# Patient Record
Sex: Female | Born: 1940 | Race: White | Hispanic: No | Marital: Married | Smoking: Never smoker
Health system: Southern US, Community
[De-identification: ages and names within clinical notes are randomized; demographics above are authoritative.]

## PROBLEM LIST (undated history)

## (undated) DIAGNOSIS — M1990 Unspecified osteoarthritis, unspecified site: Secondary | ICD-10-CM

## (undated) DIAGNOSIS — M419 Scoliosis, unspecified: Secondary | ICD-10-CM

## (undated) DIAGNOSIS — E669 Obesity, unspecified: Secondary | ICD-10-CM

## (undated) DIAGNOSIS — Z8719 Personal history of other diseases of the digestive system: Secondary | ICD-10-CM

## (undated) DIAGNOSIS — K219 Gastro-esophageal reflux disease without esophagitis: Secondary | ICD-10-CM

## (undated) DIAGNOSIS — I209 Angina pectoris, unspecified: Secondary | ICD-10-CM

## (undated) DIAGNOSIS — T8859XA Other complications of anesthesia, initial encounter: Secondary | ICD-10-CM

## (undated) DIAGNOSIS — G4733 Obstructive sleep apnea (adult) (pediatric): Secondary | ICD-10-CM

## (undated) DIAGNOSIS — Q760 Spina bifida occulta: Secondary | ICD-10-CM

## (undated) DIAGNOSIS — G8929 Other chronic pain: Secondary | ICD-10-CM

## (undated) DIAGNOSIS — I4891 Unspecified atrial fibrillation: Secondary | ICD-10-CM

## (undated) DIAGNOSIS — K5790 Diverticulosis of intestine, part unspecified, without perforation or abscess without bleeding: Secondary | ICD-10-CM

## (undated) DIAGNOSIS — M545 Low back pain, unspecified: Secondary | ICD-10-CM

## (undated) DIAGNOSIS — F329 Major depressive disorder, single episode, unspecified: Secondary | ICD-10-CM

## (undated) DIAGNOSIS — F419 Anxiety disorder, unspecified: Secondary | ICD-10-CM

## (undated) DIAGNOSIS — F32A Depression, unspecified: Secondary | ICD-10-CM

## (undated) DIAGNOSIS — E785 Hyperlipidemia, unspecified: Secondary | ICD-10-CM

## (undated) DIAGNOSIS — T4145XA Adverse effect of unspecified anesthetic, initial encounter: Secondary | ICD-10-CM

## (undated) DIAGNOSIS — I739 Peripheral vascular disease, unspecified: Secondary | ICD-10-CM

## (undated) DIAGNOSIS — G629 Polyneuropathy, unspecified: Secondary | ICD-10-CM

## (undated) DIAGNOSIS — I341 Nonrheumatic mitral (valve) prolapse: Secondary | ICD-10-CM

## (undated) DIAGNOSIS — I8390 Asymptomatic varicose veins of unspecified lower extremity: Secondary | ICD-10-CM

## (undated) DIAGNOSIS — I639 Cerebral infarction, unspecified: Secondary | ICD-10-CM

## (undated) DIAGNOSIS — IMO0002 Reserved for concepts with insufficient information to code with codable children: Secondary | ICD-10-CM

## (undated) DIAGNOSIS — K589 Irritable bowel syndrome without diarrhea: Secondary | ICD-10-CM

## (undated) DIAGNOSIS — D649 Anemia, unspecified: Secondary | ICD-10-CM

## (undated) DIAGNOSIS — I10 Essential (primary) hypertension: Secondary | ICD-10-CM

## (undated) DIAGNOSIS — N189 Chronic kidney disease, unspecified: Secondary | ICD-10-CM

## (undated) DIAGNOSIS — M797 Fibromyalgia: Secondary | ICD-10-CM

## (undated) DIAGNOSIS — I679 Cerebrovascular disease, unspecified: Secondary | ICD-10-CM

## (undated) DIAGNOSIS — L719 Rosacea, unspecified: Secondary | ICD-10-CM

## (undated) HISTORY — DX: Low back pain: M54.5

## (undated) HISTORY — DX: Major depressive disorder, single episode, unspecified: F32.9

## (undated) HISTORY — PX: MINOR HEMORRHOIDECTOMY: SHX6238

## (undated) HISTORY — DX: Unspecified osteoarthritis, unspecified site: M19.90

## (undated) HISTORY — PX: VAGINAL HYSTERECTOMY: SUR661

## (undated) HISTORY — PX: CARDIAC CATHETERIZATION: SHX172

## (undated) HISTORY — DX: Hyperlipidemia, unspecified: E78.5

## (undated) HISTORY — PX: TONSILLECTOMY: SUR1361

## (undated) HISTORY — PX: OTHER SURGICAL HISTORY: SHX169

## (undated) HISTORY — DX: Polyneuropathy, unspecified: G62.9

## (undated) HISTORY — DX: Nonrheumatic mitral (valve) prolapse: I34.1

## (undated) HISTORY — DX: Essential (primary) hypertension: I10

## (undated) HISTORY — DX: Anxiety disorder, unspecified: F41.9

## (undated) HISTORY — DX: Depression, unspecified: F32.A

## (undated) HISTORY — DX: Anemia, unspecified: D64.9

## (undated) HISTORY — DX: Chronic kidney disease, unspecified: N18.9

## (undated) HISTORY — PX: ABDOMINAL HYSTERECTOMY: SHX81

## (undated) HISTORY — DX: Peripheral vascular disease, unspecified: I73.9

## (undated) HISTORY — DX: Obesity, unspecified: E66.9

## (undated) HISTORY — PX: CHOLECYSTECTOMY: SHX55

## (undated) HISTORY — DX: Asymptomatic varicose veins of unspecified lower extremity: I83.90

## (undated) HISTORY — DX: Rosacea, unspecified: L71.9

## (undated) HISTORY — DX: Cerebral infarction, unspecified: I63.9

## (undated) HISTORY — PX: COLONOSCOPY: SHX174

## (undated) HISTORY — DX: Cerebrovascular disease, unspecified: I67.9

## (undated) HISTORY — DX: Obstructive sleep apnea (adult) (pediatric): G47.33

## (undated) HISTORY — DX: Other chronic pain: G89.29

## (undated) HISTORY — DX: Diverticulosis of intestine, part unspecified, without perforation or abscess without bleeding: K57.90

## (undated) HISTORY — DX: Low back pain, unspecified: M54.50

## (undated) HISTORY — DX: Reserved for concepts with insufficient information to code with codable children: IMO0002

## (undated) HISTORY — DX: Irritable bowel syndrome, unspecified: K58.9

## (undated) HISTORY — PX: ESOPHAGOGASTRODUODENOSCOPY: SHX1529

## (undated) HISTORY — PX: POLYPECTOMY: SHX149

## (undated) HISTORY — DX: Unspecified atrial fibrillation: I48.91

## (undated) HISTORY — DX: Spina bifida occulta: Q76.0

## (undated) HISTORY — DX: Gastro-esophageal reflux disease without esophagitis: K21.9

## (undated) HISTORY — DX: Fibromyalgia: M79.7

---

## 2007-10-22 HISTORY — PX: ENDOVENOUS ABLATION SAPHENOUS VEIN W/ LASER: SUR449

## 2013-07-03 ENCOUNTER — Telehealth: Payer: Self-pay | Admitting: Neurology

## 2013-07-03 MED ORDER — GABAPENTIN 600 MG PO TABS: 600.0000 mg | ORAL_TABLET | Freq: Three times a day (TID) | ORAL | Status: DC

## 2013-07-03 NOTE — Telephone Encounter (Signed)
Patient has an appt scheduled in May.  [employer] was bought out by [employer].  Rx has been sent. I called the patient back, got no answer.  Left message.

## 2013-07-26 ENCOUNTER — Telehealth: Payer: Self-pay | Admitting: Neurology

## 2013-07-26 MED ORDER — CLOPIDOGREL BISULFATE 75 MG PO TABS
37.5000 mg | ORAL_TABLET | Freq: Every day | ORAL | Status: DC
Start: 2013-07-26 — End: 2013-12-30

## 2013-07-26 NOTE — Telephone Encounter (Signed)
Last OV note says: The patient will continue the gabapentin for now. The patient is on aspirin and Plavix, but she is only taking one half of a Plavix daily. The patient will followup in one year. Rx has been sent.  I called the patient back.  Got no answer.  Left message.

## 2013-07-26 NOTE — Telephone Encounter (Signed)
Patient requesting refill of Plavix.  The original prescription was for  90 tablets (she only takes 1/2 tablet now and is questioning should the quantity be changed to 45 tablets? Please call to advise.

## 2013-07-26 NOTE — Telephone Encounter (Signed)
Patient requesting refill. Please advise.

## 2013-07-30 ENCOUNTER — Telehealth: Payer: Self-pay | Admitting: Neurology

## 2013-07-30 NOTE — Telephone Encounter (Signed)
Last OV note says: The patient is on aspirin and Plavix, but she is only taking one half of a Plavix daily I called back.  Spoke with Clair Gulling.  Verified Rx.  They will contact patient for shipment.  Nothing further is needed at this time.

## 2013-07-30 NOTE — Telephone Encounter (Signed)
The pharmacist from [facility] left a message that the patient's current prescription for Plavix is one half tab daily.  Previously she was taking 1 tab daily, he wanted clarification.  The reference # is [id number].

## 2013-07-30 NOTE — Telephone Encounter (Signed)
The patient apparently is taking one half Plavix tablet daily. The patient is also on aspirin. She has been taking one half of a Plavix for least a year and a half.

## 2013-10-14 ENCOUNTER — Other Ambulatory Visit: Payer: Self-pay | Admitting: *Deleted

## 2013-10-14 ENCOUNTER — Encounter: Payer: Self-pay | Admitting: Neurology

## 2013-10-14 ENCOUNTER — Encounter (INDEPENDENT_AMBULATORY_CARE_PROVIDER_SITE_OTHER): Payer: Self-pay

## 2013-10-14 ENCOUNTER — Ambulatory Visit (INDEPENDENT_AMBULATORY_CARE_PROVIDER_SITE_OTHER): Payer: BC Managed Care – PPO | Admitting: Neurology

## 2013-10-14 VITALS — BP 110/61 | HR 73 | Wt 217.0 lb

## 2013-10-14 DIAGNOSIS — G459 Transient cerebral ischemic attack, unspecified: Secondary | ICD-10-CM | POA: Insufficient documentation

## 2013-10-14 DIAGNOSIS — I679 Cerebrovascular disease, unspecified: Secondary | ICD-10-CM

## 2013-10-14 DIAGNOSIS — I83893 Varicose veins of bilateral lower extremities with other complications: Secondary | ICD-10-CM

## 2013-10-14 DIAGNOSIS — G63 Polyneuropathy in diseases classified elsewhere: Secondary | ICD-10-CM | POA: Insufficient documentation

## 2013-10-14 NOTE — Patient Instructions (Signed)

## 2013-10-14 NOTE — Progress Notes (Signed)
PATIENT: Samantha Woodard DOB: [birth year]  REASON FOR VISIT: follow up HISTORY FROM: patient  HISTORY OF PRESENT ILLNESS: Ms. Brigandi is a 73 year old female with a history of peripheral neuropathy and cerebrovascular disease. She returns today for follow-up. The patient continues gabapentin and is tolerating it well. She states that is does relieve some of the discomfort related to the peripheral neuropathy. The patient has chronic low back pain, she has been to physical therapy several times when the pain became severe. She denies any falls. When she was working in her flower bed she was bent over for too long and toppled over. She takes Plavix for cerebrovascular disease and tolerates it well. Since the last visit she was started on an oxygen concentrator at night. She reports that has been very beneficial.   REVIEW OF SYSTEMS: Full 14 system review of systems performed and notable only for:  Constitutional: N/A  Eyes: N/A Ear/Nose/Throat: N/A  Skin: N/A  Cardiovascular: leg swelling   Respiratory: N/A  Gastrointestinal: incontinence of bowels Genitourinary: urgency Hematology/Lymphatic: bruise/bleed easily Endocrine: N/A Musculoskeletal: joint pain, joint swelling, back pain, aching muscles  Allergy/Immunology: Env allergies  Neurological: numbness Psychiatric: depression  Sleep: apnea and snoring   ALLERGIES: Allergies  Allergen Reactions  . Iodine   . Neomycin   . Tape     HOME MEDICATIONS: Outpatient Prescriptions Prior to Visit  Medication Sig Dispense Refill  . clopidogrel (PLAVIX) 75 MG tablet Take 0.5 tablets (37.5 mg total) by mouth daily.  45 tablet  1  . gabapentin (NEURONTIN) 600 MG tablet Take 1 tablet (600 mg total) by mouth 3 (three) times daily.  270 tablet  1   No facility-administered medications prior to visit.    PAST MEDICAL HISTORY: Past Medical History  Diagnosis Date  . Obesity   . Peripheral neuropathy   . Cerebrovascular disease   .  Arthritis, degenerative   . Diverticulosis   . Mild mitral valve prolapse   . GERD (gastroesophageal reflux disease)   . HTN (hypertension)   . Anxiety and depression   . Fibromyalgia   . IBS (irritable bowel syndrome)   . Positional vertigo   . Dyslipidemia   . OSA (obstructive sleep apnea)   . Varicose vein   . Spina bifida occulta   . Chronic low back pain     PAST SURGICAL HISTORY: Past Surgical History  Procedure Laterality Date  . Abdominal hysterectomy    . Gallbladder resection    . Tonsillectomy    . Minor hemorrhoidectomy      FAMILY HISTORY: Family History  Problem Relation Age of Onset  . Cancer Mother   . Heart attack Father   . Diabetes Sister   . Hypertension Sister   . Hypertension Brother   . Cerebrovascular Disease Maternal Grandmother   . Stroke Paternal Grandmother   . Hypertension Sister     SOCIAL HISTORY: History   Social History  . Marital Status: Married    Spouse Name: N/A    Number of Children: 2  . Years of Education: college 2   Occupational History  . Retired    Social History Main Topics  . Smoking status: Never Smoker   . Smokeless tobacco: Never Used  . Alcohol Use: Yes     Comment: on occasion  . Drug Use: No  . Sexual Activity: Not on file   Other Topics Concern  . Not on file   Social History Narrative  . No narrative  on file      PHYSICAL EXAM  Filed Vitals:   10/14/13 1106  BP: 110/61  Pulse: 73  Weight: 217 lb (98.431 kg)   There is no height on file to calculate BMI.  Generalized: Well developed, in no acute distress   Neurological examination  Mentation: Alert oriented to time, place, history taking. Follows all commands speech and language fluent Cranial nerve II-XII:  Extraocular movements were full, visual field were full on confrontational test. Motor: The motor testing reveals 5 over 5 strength of all 4 extremities. Good symmetric motor tone is noted throughout.  Sensory: Sensory testing  is intact to soft touch on all 4 extremities. No evidence of extinction is noted.  Coordination: Cerebellar testing reveals good finger-nose-finger and heel-to-shin bilaterally.  Gait and station: Gait is normal. Tandem gait is minimally unsteady. Romberg is negative. No drift is seen.  Reflexes: Deep tendon reflexes are symmetric and normal bilaterally.    DIAGNOSTIC DATA (LABS, IMAGING, TESTING) - I reviewed patient records, labs, notes, testing and imaging myself where available.  Patient had recent labs drawn at her PCP office. Results: Chol 198, Trig 136, LDL 123, HDL 150 NA 137, K 5.1, CL 101, CA 9.8, ALP 76, AST 23, ALT 18, BUN 25, CR 1.00, GFR 54  ASSESSMENT AND PLAN 73 y.o. year old female  has a past medical history of Obesity; Peripheral neuropathy; Cerebrovascular disease; Arthritis, degenerative; Diverticulosis; Mild mitral valve prolapse; GERD (gastroesophageal reflux disease); HTN (hypertension); Anxiety and depression; Fibromyalgia; IBS (irritable bowel syndrome); Positional vertigo; Dyslipidemia; OSA (obstructive sleep apnea); Varicose vein; Spina bifida occulta; and Chronic low back pain. here with:  1. Cerebrovascular disease, unspecified 2. Polyneuropathy in other diseases classified elsewhere  The patient has remained stable. Gabapentin has been well tolerated and offers good relief of neuropathy pain. The patient is on Plavix for cerebrovascular disease Continue Gabapentin and Plavix Follow-up in 1 year or sooner if needed.    Ward Givens, MSN, NP-C 10/14/2013, 11:18 AM [facility] [address], [place], [place] [address] [phone]  Note: This document was prepared with digital dictation and possible smart phrase technology. Any transcriptional errors that result from this process are unintentional. Kathrynn Ducking

## 2013-11-26 DIAGNOSIS — R159 Full incontinence of feces: Secondary | ICD-10-CM | POA: Insufficient documentation

## 2013-11-26 DIAGNOSIS — K648 Other hemorrhoids: Secondary | ICD-10-CM | POA: Insufficient documentation

## 2013-12-04 ENCOUNTER — Encounter: Payer: Self-pay | Admitting: Vascular Surgery

## 2013-12-05 ENCOUNTER — Encounter: Payer: Self-pay | Admitting: Vascular Surgery

## 2013-12-05 ENCOUNTER — Ambulatory Visit (HOSPITAL_COMMUNITY)
Admission: RE | Admit: 2013-12-05 | Discharge: 2013-12-05 | Disposition: A | Discharge status: Home / Self Care | Payer: MEDICARE | Source: Ambulatory Visit | Attending: Vascular Surgery | Admitting: Vascular Surgery

## 2013-12-05 ENCOUNTER — Ambulatory Visit (INDEPENDENT_AMBULATORY_CARE_PROVIDER_SITE_OTHER): Payer: BC Managed Care – PPO | Admitting: Vascular Surgery

## 2013-12-05 VITALS — BP 137/63 | HR 69 | Ht 69.0 in | Wt 218.0 lb

## 2013-12-05 DIAGNOSIS — I83893 Varicose veins of bilateral lower extremities with other complications: Secondary | ICD-10-CM | POA: Insufficient documentation

## 2013-12-05 DIAGNOSIS — Z7902 Long term (current) use of antithrombotics/antiplatelets: Secondary | ICD-10-CM | POA: Insufficient documentation

## 2013-12-05 NOTE — Progress Notes (Signed)
Referred by:  Gara Kroner, MD [address] [place], [place] [address]  Reason for referral: Swollen legs bilaterally  History of Present Illness  Samantha Woodard is a 73 y.o. (08/13/1940) female who presents with chief complaint: swollen legs.  Patient notes swelling of legs for several years. Symptoms are described as heaviness and aching. She is sedentary mostly. She has had a previous history of varicose veins. She had laser ablation of the left greater saphenous vein several years ago. The patient denies a history of DVT,  history of venous stasis ulcers, lymphedema and history of skin changes in lower legs.  There is a positive family history of venous disorders.  The patient has used compression stockings in the past. She also complains of a burning sensation in the inner aspect of her left calf over several varicosities.  She has hypertension treated with an ace inhibitor. She has hypercholesterolemia treated with a statin. She is on aspirin. She is not diabetic.   Past Medical History  Diagnosis Date  . Obesity   . Peripheral neuropathy   . Cerebrovascular disease   . Arthritis, degenerative   . Diverticulosis   . Mild mitral valve prolapse   . GERD (gastroesophageal reflux disease)   . HTN (hypertension)   . Anxiety and depression   . Fibromyalgia   . IBS (irritable bowel syndrome)   . Positional vertigo   . Dyslipidemia   . OSA (obstructive sleep apnea)   . Varicose vein   . Spina bifida occulta   . Chronic low back pain   . Anemia   . Atrial fibrillation   . Stroke   . Chronic kidney disease   . Peripheral vascular disease     Past Surgical History  Procedure Laterality Date  . Abdominal hysterectomy    . Gallbladder resection    . Tonsillectomy    . Minor hemorrhoidectomy    . Endovenous ablation saphenous vein w/ laser  10/22/2007    History   Social History  . Marital Status: Married    Spouse Name: N/A    Number of Children: 2  .  Years of Education: college 2   Occupational History  . Retired    Social History Main Topics  . Smoking status: Never Smoker   . Smokeless tobacco: Never Used  . Alcohol Use: Yes     Comment: on occasion  . Drug Use: No  . Sexual Activity: Not on file   Other Topics Concern  . Not on file   Social History Narrative  . No narrative on file    Family History  Problem Relation Age of Onset  . Cancer Mother   . Hypertension Mother   . Varicose Veins Mother   . Heart attack Mother   . Heart attack Father   . Cancer Father   . Heart disease Father   . Hypertension Father   . Diabetes Sister   . Hypertension Sister   . Hyperlipidemia Sister   . Varicose Veins Sister   . Hypertension Brother   . Hyperlipidemia Brother   . Cerebrovascular Disease Maternal Grandmother   . Stroke Paternal Grandmother   . Hypertension Sister   . Hyperlipidemia Sister   . Varicose Veins Sister       Current Outpatient Prescriptions on File Prior to Visit  Medication Sig Dispense Refill  . celecoxib (CELEBREX) 200 MG capsule Take 200 mg by mouth daily.      . clopidogrel (  PLAVIX) 75 MG tablet Take 0.5 tablets (37.5 mg total) by mouth daily.  45 tablet  1  . dicyclomine (BENTYL) 10 MG capsule Take 10 mg by mouth daily.      . fluticasone (FLONASE) 50 MCG/ACT nasal spray Place 2 sprays into both nostrils daily.      Marland Kitchen gabapentin (NEURONTIN) 600 MG tablet Take 1 tablet (600 mg total) by mouth 3 (three) times daily.  270 tablet  1  . hydrochlorothiazide (HYDRODIURIL) 12.5 MG tablet Take 12.5 mg by mouth daily.      Marland Kitchen lisinopril (PRINIVIL,ZESTRIL) 5 MG tablet Take 2.5 mg by mouth daily.      . pantoprazole (PROTONIX) 40 MG tablet Take 40 mg by mouth daily.      . sertraline (ZOLOFT) 100 MG tablet Take 100 mg by mouth daily.      . simvastatin (ZOCOR) 20 MG tablet Take 20 mg by mouth daily.       No current facility-administered medications on file prior to visit.    Allergies  Allergen  Reactions  . Iodine   . Neomycin   . Tape       REVIEW OF SYSTEMS:  (Positives checked otherwise negative)  CARDIOVASCULAR:  []  chest pain, []  chest pressure, []  palpitations, []  shortness of breath when laying flat, []  shortness of breath with exertion,  [x]  pain in legs when walking, []  pain in feet when laying flat, []  history of blood clot in veins (DVT), []  history of phlebitis, [x]  swelling in legs, [x]  varicose veins  PULMONARY:  []  productive cough, []  asthma, []  wheezing  NEUROLOGIC:  []  weakness in arms or legs, []  numbness in arms or legs, []  difficulty speaking or slurred speech, []  temporary loss of vision in one eye, []  dizziness  HEMATOLOGIC:  []  bleeding problems, []  problems with blood clotting too easily  MUSCULOSKEL:  []  joint pain, []  joint swelling  GASTROINTEST:  []  vomiting blood, []  blood in stool     GENITOURINARY:  []  burning with urination, []  blood in urine  PSYCHIATRIC:  []  history of major depression  INTEGUMENTARY:  []  rashes, []  ulcers  CONSTITUTIONAL:  []  fever, []  chills   Physical Examination Filed Vitals:   12/05/13 1322  BP: 137/63  Pulse: 69  Height: 5\' 9"  (1.753 m)  Weight: 218 lb (98.884 kg)  SpO2: 96%   Body mass index is 32.18 kg/(m^2).  General: A&O x 3, WD obese female in NAD  Head: [place]/AT  Ear/Nose/Throat: Hearing grossly intact  Eyes: PERRLA, EOMI  Neck: Supple, no nuchal rigidity, no palpable LAD  Pulmonary: Sym exp, good air movt, CTAB, no rales, rhonchi, & wheezing  Cardiac: RRR, Nl S1, S2, no Murmurs, rubs or gallops  Vascular: Vessel Right Left  Radial Palpable Palpable  Carotid Without bruit Without bruit  Aorta Not palpable N/A  Femoral 2+ 2+  Popliteal Not palpable Not palpable  PT 1+ 1+  DP Not palpable Not palpable   Extremities: Diffuse reticular veins and spider telangectasias of lower extremities. No tortuous varicose veins seen.  Gastrointestinal: soft, NTND, -G/R, - HSM, -  masses  Musculoskeletal: M/S 5/5 throughout. Extremities without ischemic changes   Neurologic: CN 2-12 intact.  Motor exam as listed above  Psychiatric: Judgment intact, Mood & affect appropriate for pt's clinical situation  Dermatologic: See M/S exam for extremity exam, no rashes otherwise noted  Lymph : No inguinal lymphadenopathy   Non-Invasive Vascular Imaging  BLE Venous Insufficiency Duplex (Date: 12/05/2013):   RLE:  negative DVT and SVT, negative GSV reflux, positive deep venous reflux  LLE: negative DVT and SVT, positive GSV reflux, positive deep venous reflux  Outside Studies/Documentation Reviewed notes from previous laser ablation performed at [facility].   Medical Decision Making  Kasmira Cacioppo is a 73 y.o. female who presents with: bilateral LE chronic venous insufficiency    Based on the patient's history and examination, recommended continuation of 20-30 mm compression stockings and sclerotherapy. She has had previous left saphenous laser ablation.   Patient has decided to proceed with sclerotherapy and an appointment has been set up with the clinic vein nurse.   Discussed weight loss and elevation of legs.   Thank you for allowing [place] to participate in this patient's care.  Virgina Jock, PA-C [facility] Office: [phone] Pager: [phone]  12/05/2013, 2:04 PM  History exam details as above. Her venous duplex scan shows that she has had successful obliteration of her left greater saphenous vein. She has multiple spider reticular type veins in her left and right lower extremity which are bothersome to her. She has been overall fairly compliant with wearing compression stockings. I encouraged her to continue this. We did discuss weight loss would improve 75% of venous symptoms. She is going to try to lose some weight over time. She will replace her compression stockings on as-needed basis. I have referred her  to our vein nurse Kathlee Nations Wort for sclerotherapy of the areas that are symptomatic in both lower extremities.  Ruta Hinds, MD [facility] Office: [phone] Pager: [phone]

## 2013-12-18 ENCOUNTER — Telehealth: Payer: Self-pay | Admitting: Neurology

## 2013-12-18 MED ORDER — GABAPENTIN 600 MG PO TABS: 600.0000 mg | ORAL_TABLET | Freq: Three times a day (TID) | ORAL | Status: DC

## 2013-12-18 NOTE — Telephone Encounter (Signed)
Patient requesting Rx refill for gabapentin (NEURONTIN) 600 MG tablet.  Please call and advise.  Thanks

## 2013-12-18 NOTE — Telephone Encounter (Signed)
Rx has been sent.  I called the patient back, got no answer.  Left message.

## 2013-12-30 ENCOUNTER — Telehealth: Payer: Self-pay | Admitting: Neurology

## 2013-12-30 MED ORDER — CLOPIDOGREL BISULFATE 75 MG PO TABS: 37.5000 mg | ORAL_TABLET | Freq: Every day | ORAL | Status: DC

## 2013-12-30 MED ORDER — GABAPENTIN 600 MG PO TABS: 600.0000 mg | ORAL_TABLET | Freq: Three times a day (TID) | ORAL | Status: DC

## 2013-12-30 NOTE — Telephone Encounter (Signed)
Last OV from [facility] says: The patient will continue the gabapentin for now. The patient is on aspirin and Plavix, but she is only taking one half of a Plavix daily. The patient will followup in one year.  [employer] was purchased by [employer].  Rx's have been sent.

## 2013-12-30 NOTE — Telephone Encounter (Signed)
Needs 90day supply clopidogrel (PLAVIX) 75 MG tablet as well as the gabapentin (NEURONTIN) 600 MG tablet should have been sent to [facility]. they both need to be sent to [facility].

## 2014-03-27 DIAGNOSIS — Z86010 Personal history of colonic polyps: Secondary | ICD-10-CM | POA: Insufficient documentation

## 2014-04-23 ENCOUNTER — Encounter: Payer: Self-pay | Admitting: Neurology

## 2014-04-29 ENCOUNTER — Encounter: Payer: Self-pay | Admitting: Neurology

## 2014-10-16 ENCOUNTER — Ambulatory Visit (INDEPENDENT_AMBULATORY_CARE_PROVIDER_SITE_OTHER): Payer: Medicare Other | Admitting: Neurology

## 2014-10-16 ENCOUNTER — Encounter: Payer: Self-pay | Admitting: Neurology

## 2014-10-16 VITALS — BP 109/67 | HR 75 | Ht 69.0 in | Wt 222.8 lb

## 2014-10-16 DIAGNOSIS — G63 Polyneuropathy in diseases classified elsewhere: Secondary | ICD-10-CM | POA: Diagnosis not present

## 2014-10-16 DIAGNOSIS — I679 Cerebrovascular disease, unspecified: Secondary | ICD-10-CM | POA: Diagnosis not present

## 2014-10-16 MED ORDER — CLOPIDOGREL BISULFATE 75 MG PO TABS: 37.5000 mg | ORAL_TABLET | Freq: Every day | ORAL | Status: DC

## 2014-10-16 MED ORDER — GABAPENTIN 600 MG PO TABS: 600.0000 mg | ORAL_TABLET | Freq: Three times a day (TID) | ORAL | Status: DC

## 2014-10-16 NOTE — Patient Instructions (Signed)

## 2014-10-16 NOTE — Progress Notes (Signed)
Reason for visit: Peripheral neuropathy  Samantha Woodard is an 74 y.o. female  History of present illness:  Samantha Woodard is a 74 year old white female with a history of a peripheral neuropathy. The patient has done well over the last year. The patient is on gabapentin, and this seems to be controlling her discomfort. The patient does have some mild balance issues, but she denies any falls since last seen. When she is walking long distances, she may use a cane for ambulation. She does have some difficulty with sleeping at times. The patient will get up and walk in the evenings on occasion to help the discomfort. She does have some urgency of the bladder, and occasional incontinence of bowel and bladder. 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 She has had a lot of arthritis in the knees, and she is getting injections in the knees at times. She returns to this office for an evaluation.  Past Medical History  Diagnosis Date  . Obesity   . Peripheral neuropathy   . Cerebrovascular disease   . Arthritis, degenerative   . Diverticulosis   . Mild mitral valve prolapse   . GERD (gastroesophageal reflux disease)   . HTN (hypertension)   . Anxiety and depression   . Fibromyalgia   . IBS (irritable bowel syndrome)   . Positional vertigo   . Dyslipidemia   . OSA (obstructive sleep apnea)   . Varicose vein   . Spina bifida occulta   . Chronic low back pain   . Anemia   . Atrial fibrillation   . Stroke   . Chronic kidney disease   . Peripheral vascular disease     Past Surgical History  Procedure Laterality Date  . Abdominal hysterectomy    . Gallbladder resection    . Tonsillectomy    . Minor hemorrhoidectomy    . Endovenous ablation saphenous vein w/ laser  10/22/2007    Family History  Problem Relation Age of Onset  . Cancer Mother   . Hypertension Mother   . Varicose Veins Mother   . Heart attack Mother   . Heart attack Father   . Cancer Father     . Heart disease Father   . Hypertension Father   . Diabetes Sister   . Hypertension Sister   . Hyperlipidemia Sister   . Varicose Veins Sister   . Hypertension Brother   . Hyperlipidemia Brother   . Cerebrovascular Disease Maternal Grandmother   . Stroke Paternal Grandmother   . Hypertension Sister   . Hyperlipidemia Sister   . Varicose Veins Sister     Social history:  reports that she has never smoked. She has never used smokeless tobacco. She reports that she does not drink alcohol or use illicit drugs.    Allergies  Allergen Reactions  . Iodine   . Neomycin   . Tape     Medications:  Prior to Admission medications   Medication Sig Start Date End Date Taking? Authorizing Provider  aspirin 81 MG tablet Take 81 mg by mouth daily.   Yes Historical Provider, MD  carisoprodol (SOMA) 350 MG tablet Take 350 mg by mouth as needed for muscle spasms (Patient usually only take 1/2 tablet).    Yes Historical Provider, MD  clopidogrel (PLAVIX) 75 MG tablet Take 0.5 tablets (37.5 mg total) by mouth daily. 12/30/13  Yes Kathrynn Ducking, MD  fluticasone [facility]) 50 MCG/ACT nasal spray Place 2 sprays into both nostrils daily.  08/16/13  Yes Historical Provider, MD  gabapentin (NEURONTIN) 600 MG tablet Take 1 tablet (600 mg total) by mouth 3 (three) times daily. 12/30/13  Yes Kathrynn Ducking, MD  hydrochlorothiazide (HYDRODIURIL) 12.5 MG tablet Take 6.25 mg by mouth daily.  07/18/13  Yes Historical Provider, MD  lisinopril (PRINIVIL,ZESTRIL) 5 MG tablet Take 2.5 mg by mouth daily. 07/17/13  Yes Historical Provider, MD  pantoprazole (PROTONIX) 40 MG tablet Take 40 mg by mouth daily. 07/26/13  Yes Historical Provider, MD  sertraline (ZOLOFT) 100 MG tablet Take 100 mg by mouth daily. 07/26/13  Yes Historical Provider, MD  simvastatin (ZOCOR) 20 MG tablet Take 20 mg by mouth daily. 09/03/13  Yes Historical Provider, MD    ROS:  Out of a complete 14 system review of symptoms, the patient complains  only of the following symptoms, and all other reviewed systems are negative.  Activity change, decreased activity Leg swelling Incontinence of bowel Sleep apnea Environmental allergies Urinary urgency Bruising easily Seasonal depression  Blood pressure 109/67, pulse 75, height 5\' 9"  (1.753 m), weight 222 lb 12.8 oz (101.061 kg).  Physical Exam  General: The patient is alert and cooperative at the time of the examination. The patient is moderately obese.  Skin: No significant peripheral edema is noted.   Neurologic Exam  Mental status: The patient is alert and oriented x 3 at the time of the examination. The patient has apparent normal recent and remote memory, with an apparently normal attention span and concentration ability.   Cranial nerves: Facial symmetry is present. Speech is normal, no aphasia or dysarthria is noted. Extraocular movements are full. Visual fields are full.  Motor: The patient has good strength in all 4 extremities.  Sensory examination: Soft touch sensation is symmetric on the face, arms, and legs. There is a stocking glove pinprick sensory deficit up to the knees bilaterally.  Coordination: The patient has good finger-nose-finger and heel-to-shin bilaterally.  Gait and station: The patient has a normal gait. Tandem gait is slightly unsteady. Romberg is negative. No drift is seen.  Reflexes: Deep tendon reflexes are symmetric, but are depressed.   Assessment/Plan:  1. Peripheral neuropathy  2. Mild gait disturbance  3. History of cerebrovascular disease  The patient is doing fairly well at this time. She will continue her gabapentin at the current dose. She has a history of cerebrovascular disease, and she remains on low-dose Plavix and aspirin. Prescriptions were called in for these medications. She will follow-up in one year, or sooner if needed.  Jill Alexanders MD 10/16/2014 7:55 PM  [facility] [address] [place], [place] [address]  Phone [phone] Fax [phone]

## 2015-02-18 ENCOUNTER — Encounter: Payer: Self-pay | Admitting: *Deleted

## 2015-03-02 ENCOUNTER — Ambulatory Visit: Payer: Medicare Other | Admitting: General Surgery

## 2015-04-15 ENCOUNTER — Encounter: Payer: Self-pay | Admitting: *Deleted

## 2015-10-13 ENCOUNTER — Ambulatory Visit (INDEPENDENT_AMBULATORY_CARE_PROVIDER_SITE_OTHER): Payer: Medicare Other | Admitting: Adult Health

## 2015-10-13 ENCOUNTER — Encounter: Payer: Self-pay | Admitting: Adult Health

## 2015-10-13 VITALS — BP 143/75 | HR 76 | Ht 69.0 in | Wt 239.6 lb

## 2015-10-13 DIAGNOSIS — I679 Cerebrovascular disease, unspecified: Secondary | ICD-10-CM | POA: Diagnosis not present

## 2015-10-13 DIAGNOSIS — G629 Polyneuropathy, unspecified: Secondary | ICD-10-CM

## 2015-10-13 MED ORDER — GABAPENTIN 600 MG PO TABS: 600.0000 mg | ORAL_TABLET | Freq: Three times a day (TID) | ORAL | Status: DC

## 2015-10-13 MED ORDER — CLOPIDOGREL BISULFATE 75 MG PO TABS: 37.5000 mg | ORAL_TABLET | Freq: Every day | ORAL | Status: DC

## 2015-10-13 NOTE — Progress Notes (Signed)
PATIENT: Samantha Woodard DOB: 09-24-1940  REASON FOR VISIT: follow up- peripheral neuropathy, cerebrovascular disease HISTORY FROM: patient  HISTORY OF PRESENT ILLNESS: Samantha Woodard is a 75 year old female with a history of peripheral neuropathy and cerebrovascular disease. She returns today for follow-up. She continues to take gabapentin 600 mg 3 times a day. She states that this controls her symptoms well. Occasionally she will have tingling and numbness in the left foot that is intermittent however she is unsure if this is due to the neuropathy or her spine issues. She does report some tingling in the fingers out laterally. She feels that this may be related to neuropathy however she is worried that it could be related to her spine. She is currently in physical therapy for scoliosis. She reports no significant changes in her gait or balance. She states occasionally she'll have issues with her joints which will affect her balance. She reports that she will often veer off to the left. She remains on Plavix low-dose after TIA events. She reports that she is tolerating this medication well. She returns today for an evaluation.  HISTORY 10/16/14 (WILLIS): Samantha Woodard is a 75 year old white female with a history of a peripheral neuropathy. The patient has done well over the last year. The patient is on gabapentin, and this seems to be controlling her discomfort. The patient does have some mild balance issues, but she denies any falls since last seen. When she is walking long distances, she may use a cane for ambulation. She does have some difficulty with sleeping at times. The patient will get up and walk in the evenings on occasion to help the discomfort. She does have some urgency of the bladder, and occasional incontinence of bowel and bladder. 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 She has had a lot of arthritis in the knees, and she is getting injections in the knees at times.  She returns to this office for an evaluation.   REVIEW OF SYSTEMS: Out of a complete 14 system review of symptoms, the patient complains only of the following symptoms, and all other reviewed systems are negative.  Joint pain, joint swelling, back pain, aching muscles, muscle cramps, walking difficulty, urgency, environmental allergies, bruise/bleed easily, numbness, apnea, snoring, leg swelling, ringing in ears  ALLERGIES: Allergies  Allergen Reactions  . Iodine   . Neomycin   . Tape     HOME MEDICATIONS: Outpatient Prescriptions Prior to Visit  Medication Sig Dispense Refill  . aspirin 81 MG tablet Take 81 mg by mouth daily.    . carisoprodol (SOMA) 350 MG tablet Take 350 mg by mouth as needed for muscle spasms (Patient usually only take 1/2 tablet).     . clopidogrel (PLAVIX) 75 MG tablet Take 0.5 tablets (37.5 mg total) by mouth daily. 45 tablet 3  . fluticasone (FLONASE) 50 MCG/ACT nasal spray Place 2 sprays into both nostrils daily.    Marland Kitchen gabapentin (NEURONTIN) 600 MG tablet Take 1 tablet (600 mg total) by mouth 3 (three) times daily. 270 tablet 3  . hydrochlorothiazide (HYDRODIURIL) 12.5 MG tablet Take 6.25 mg by mouth daily.     Marland Kitchen lisinopril (PRINIVIL,ZESTRIL) 5 MG tablet Take 2.5 mg by mouth daily.    . pantoprazole (PROTONIX) 40 MG tablet Take 40 mg by mouth daily.    . simvastatin (ZOCOR) 20 MG tablet Take 20 mg by mouth daily.    . sertraline (ZOLOFT) 100 MG tablet Take 100 mg by mouth daily. Reported on  10/13/2015     No facility-administered medications prior to visit.    PAST MEDICAL HISTORY: Past Medical History  Diagnosis Date  . Obesity   . Peripheral neuropathy ([place])   . Cerebrovascular disease   . Arthritis, degenerative   . Diverticulosis   . Mild mitral valve prolapse   . GERD (gastroesophageal reflux disease)   . HTN (hypertension)   . Anxiety and depression   . Fibromyalgia   . IBS (irritable bowel syndrome)   . Positional vertigo   . Dyslipidemia     . OSA (obstructive sleep apnea)   . Varicose vein   . Spina bifida occulta   . Chronic low back pain   . Anemia   . Atrial fibrillation ([place])   . Stroke ([place])   . Chronic kidney disease   . Peripheral vascular disease ([place])     PAST SURGICAL HISTORY: Past Surgical History  Procedure Laterality Date  . Abdominal hysterectomy    . Gallbladder resection    . Tonsillectomy    . Minor hemorrhoidectomy    . Endovenous ablation saphenous vein w/ laser  10/22/2007    FAMILY HISTORY: Family History  Problem Relation Age of Onset  . Cancer Mother   . Hypertension Mother   . Varicose Veins Mother   . Heart attack Mother   . Heart attack Father   . Cancer Father   . Heart disease Father   . Hypertension Father   . Diabetes Sister   . Hypertension Sister   . Hyperlipidemia Sister   . Varicose Veins Sister   . Hypertension Brother   . Hyperlipidemia Brother   . Cerebrovascular Disease Maternal Grandmother   . Stroke Paternal Grandmother   . Hypertension Sister   . Hyperlipidemia Sister   . Varicose Veins Sister   . Carpal tunnel syndrome Sister     SOCIAL HISTORY: Social History   Social History  . Marital Status: Married    Spouse Name: N/A  . Number of Children: 2  . Years of Education: college 2   Occupational History  . Retired    Social History Main Topics  . Smoking status: Never Smoker   . Smokeless tobacco: Never Used  . Alcohol Use: No     Comment: on occasion  . Drug Use: No  . Sexual Activity: Not on file   Other Topics Concern  . Not on file   Social History Narrative   Patient is right handed.   Patient does not drink caffeine.      PHYSICAL EXAM  Filed Vitals:   10/13/15 1415  BP: 143/75  Pulse: 76  Height: 5\' 9"  (1.753 m)  Weight: 239 lb 9.6 oz (108.682 kg)   Body mass index is 35.37 kg/(m^2).  Generalized: Well developed, in no acute distress   Neurological examination  Mentation: Alert oriented to time, place, history  taking. Follows all commands speech and language fluent Cranial nerve II-XII: Pupils were equal round reactive to light. Extraocular movements were full, visual field were full on confrontational test. Facial sensation and strength were normal. Uvula tongue midline. Head turning and shoulder shrug  were normal and symmetric. Motor: The motor testing reveals 5 over 5 strength of all 4 extremities. Good symmetric motor tone is noted throughout.  Sensory: Sensory testing is intact to soft touch on all 4 extremities. No evidence of extinction is noted.  Coordination: Cerebellar testing reveals good finger-nose-finger and heel-to-shin bilaterally.  Gait and station: Gait is normal. Tandem gait  is normal. Romberg is negative. No drift is seen.  Reflexes: Deep tendon reflexes are symmetric and normal bilaterally.   DIAGNOSTIC DATA (LABS, IMAGING, TESTING) - I reviewed patient records, labs, notes, testing and imaging myself where available.  MRI of the lumbar spine 09/10/2015:  Moderate lumbar levoscoliosis Advanced multilevel disc and facet degeneration with mild lateral recess and neural foraminal narrowing as above    ASSESSMENT AND PLAN 75 y.o. year old female  has a past medical history of Obesity; Peripheral neuropathy ([place]); Cerebrovascular disease; Arthritis, degenerative; Diverticulosis; Mild mitral valve prolapse; GERD (gastroesophageal reflux disease); HTN (hypertension); Anxiety and depression; Fibromyalgia; IBS (irritable bowel syndrome); Positional vertigo; Dyslipidemia; OSA (obstructive sleep apnea); Varicose vein; Spina bifida occulta; Chronic low back pain; Anemia; Atrial fibrillation ([place]); Stroke [facility]); Chronic kidney disease; and Peripheral vascular disease ([place]). here with:  1. History of vascular disease 2. Peripheral neuropathy  Overall the patient is doing well. She will continue on gabapentin 600 mg 3 times a day. She will continue to monitor her symptoms. If the discomfort  in her hands worsened she will let us know. We may have to consider a nerve conduction with EMG of the upper extremities. She will continue on Plavix. Refills was sent for both gabapentin and Plavix today. She will follow-up in one year or sooner if needed.     Ward Givens, MSN, NP-C 10/13/2015, 2:38 PM [facility] [address], [place], [place] [address] [phone]

## 2015-10-13 NOTE — Progress Notes (Signed)
I have read the note, and I agree with the clinical assessment and plan.  Dyna Figuereo KEITH   

## 2015-10-13 NOTE — Patient Instructions (Signed)
Continue gabapentin 600 mg three times a day Continue Plavix If your symptoms worsen or you develop new symptoms please let us know.

## 2015-10-14 ENCOUNTER — Ambulatory Visit: Payer: Medicare Other | Admitting: Adult Health

## 2015-10-16 ENCOUNTER — Ambulatory Visit: Payer: Medicare Other | Admitting: Neurology

## 2015-11-04 ENCOUNTER — Telehealth: Payer: Self-pay | Admitting: Adult Health

## 2015-11-04 NOTE — Telephone Encounter (Signed)
LMVM for pt that got message for refill on plavix.   [facility].  If needs to go to other pharmacy to call me back.

## 2015-11-04 NOTE — Telephone Encounter (Signed)
Message For: OFFICE               Taken 29-MAY-17 at 12:01PM by DEF ------------------------------------------------------------  Samantha Woodard [facility]              CID  [id number]   Patient  SAME                  Pt's Dr  [name]        Area Code  336  Phone#  [phone]  [phone]      RE  NEEDS PLAVIX REFILL                                                                                     Disp:Y/N  N  If Y = C/B If No Response In 4minutes  ============================================================

## 2015-11-04 NOTE — Telephone Encounter (Signed)
Message For: [facility]                  Taken 31-MAY-17 at 11:16AM by JPO ------------------------------------------------------------  Samantha Woodard [facility]              CID  [id number]   Patient  SAME                  Pt's Dr  Clabe Seal      Area Code  336  Phone#  [phone]  2 24 [place]                                                              Disp:Y/N  N  If Y = C/B If No Response In 76minutes  ============================================================

## 2015-11-04 NOTE — Telephone Encounter (Signed)
Did confirm with pt.  She will go [facility] and check.  She will call back as needed.

## 2015-11-30 ENCOUNTER — Telehealth: Payer: Self-pay | Admitting: *Deleted

## 2015-11-30 NOTE — Telephone Encounter (Signed)
Received notice as FYI, that pt receiving less then recommended dose of plavix. Taking 1/2 tablet daily.  Per last office note, aware.  Forwarded notice to MM/NP.

## 2015-12-09 ENCOUNTER — Telehealth: Payer: Self-pay | Admitting: *Deleted

## 2015-12-09 NOTE — Telephone Encounter (Signed)
Received fax relating to gabapentin and kidney function. Placed in inbox.

## 2015-12-09 NOTE — Telephone Encounter (Signed)
Please obtain her latest lab results (kidney function) from PCP.

## 2015-12-10 NOTE — Telephone Encounter (Signed)
I called and [facility] and spoke to MR and they will fax 11/2015 lab results to [place] at [phone].

## 2015-12-18 NOTE — Telephone Encounter (Signed)
Called again.  Will resend to fax [phone]. (initially sent 12-10-15).  Received placed on desk inbox.

## 2015-12-22 NOTE — Telephone Encounter (Signed)
Lab work relatively unremarkable. Her GFR slightly decreased- 50, HDL increased 135

## 2016-03-02 ENCOUNTER — Other Ambulatory Visit: Payer: Self-pay | Admitting: Obstetrics and Gynecology

## 2016-03-02 DIAGNOSIS — Z1231 Encounter for screening mammogram for malignant neoplasm of breast: Secondary | ICD-10-CM

## 2016-03-16 ENCOUNTER — Encounter: Payer: Self-pay | Admitting: Podiatry

## 2016-03-16 ENCOUNTER — Other Ambulatory Visit: Payer: Self-pay | Admitting: *Deleted

## 2016-03-16 ENCOUNTER — Encounter: Payer: Self-pay | Admitting: *Deleted

## 2016-03-16 ENCOUNTER — Ambulatory Visit (INDEPENDENT_AMBULATORY_CARE_PROVIDER_SITE_OTHER): Payer: Medicare Other | Admitting: Podiatry

## 2016-03-16 DIAGNOSIS — M79673 Pain in unspecified foot: Secondary | ICD-10-CM

## 2016-03-16 DIAGNOSIS — M79676 Pain in unspecified toe(s): Secondary | ICD-10-CM | POA: Diagnosis not present

## 2016-03-16 DIAGNOSIS — B351 Tinea unguium: Secondary | ICD-10-CM

## 2016-03-16 NOTE — Progress Notes (Signed)
   Subjective:    Patient ID: Samantha Woodard, female    DOB: 1940-08-09, 75 y.o.   MRN: [id number]  HPI: She presents today to complaint of painful elongated toenails that she had a portion of the hallux nails removed many years ago but small fragments are growing back.    Review of Systems  Constitutional: Positive for activity change and unexpected weight change.  HENT: Positive for sinus pressure.   Gastrointestinal: Positive for abdominal distention and diarrhea.  Genitourinary: Positive for urgency.  Musculoskeletal: Positive for arthralgias, back pain, gait problem and myalgias.  Neurological: Positive for weakness and numbness.  Hematological: Bruises/bleeds easily.  All other systems reviewed and are negative.      Objective:   Physical Exam: Vital signs are stable she is alert and oriented 3 pulses are palpable. Neurologic sensorium is intact. Mild hammertoe deformities are noted. Toenails are thick yellow dystrophic onychomycotic.          Assessment & Plan:  A limp secondary onychomycosis.  Plan: Debridement toenails 1 through 5 bilateral.

## 2016-03-25 ENCOUNTER — Ambulatory Visit
Admission: RE | Admit: 2016-03-25 | Discharge: 2016-03-25 | Disposition: A | Discharge status: Home / Self Care | Payer: Medicare Other | Source: Ambulatory Visit | Attending: Obstetrics and Gynecology | Admitting: Obstetrics and Gynecology

## 2016-03-25 ENCOUNTER — Encounter: Payer: Self-pay | Admitting: Radiology

## 2016-03-25 DIAGNOSIS — Z1231 Encounter for screening mammogram for malignant neoplasm of breast: Secondary | ICD-10-CM | POA: Diagnosis not present

## 2016-03-30 ENCOUNTER — Other Ambulatory Visit: Payer: Self-pay | Admitting: *Deleted

## 2016-03-30 ENCOUNTER — Ambulatory Visit
Admission: RE | Admit: 2016-03-30 | Discharge: 2016-03-30 | Disposition: A | Discharge status: Home / Self Care | Payer: Self-pay | Source: Ambulatory Visit | Attending: *Deleted | Admitting: *Deleted

## 2016-03-30 DIAGNOSIS — Z9289 Personal history of other medical treatment: Secondary | ICD-10-CM

## 2016-06-22 ENCOUNTER — Ambulatory Visit: Payer: Medicare Other | Admitting: Podiatry

## 2016-06-27 ENCOUNTER — Encounter: Payer: Self-pay | Admitting: Podiatry

## 2016-06-27 ENCOUNTER — Ambulatory Visit (INDEPENDENT_AMBULATORY_CARE_PROVIDER_SITE_OTHER): Payer: Medicare Other | Admitting: Podiatry

## 2016-06-27 DIAGNOSIS — B351 Tinea unguium: Secondary | ICD-10-CM

## 2016-06-27 DIAGNOSIS — M79676 Pain in unspecified toe(s): Secondary | ICD-10-CM | POA: Diagnosis not present

## 2016-06-27 NOTE — Progress Notes (Signed)
Complaint:  Visit Type: Patient returns to my office for continued preventative foot care services. Complaint: Patient states" my nails have grown long and thick and become painful to walk and wear shoes"  The patient presents for preventative foot care services. No changes to ROS  Podiatric Exam: Vascular: dorsalis pedis and posterior tibial pulses are palpable bilateral. Capillary return is immediate. Temperature gradient is WNL. Skin turgor WNL  Sensorium: Normal Semmes Weinstein monofilament test. Normal tactile sensation bilaterally. Nail Exam: Pt has thick disfigured discolored nails with subungual debris noted bilateral entire nail hallux through fifth toenails Ulcer Exam: There is no evidence of ulcer or pre-ulcerative changes or infection. Orthopedic Exam: Muscle tone and strength are WNL. No limitations in general ROM. No crepitus or effusions noted. Foot type and digits show no abnormalities. Forefoot equinus foot type with hammer toes 2-5  B/L Skin: No Porokeratosis. No infection or ulcers  Diagnosis:  Onychomycosis, , Pain in right toe, pain in left toes  Treatment & Plan Procedures and Treatment: Consent by patient was obtained for treatment procedures. The patient understood the discussion of treatment and procedures well. All questions were answered thoroughly reviewed. Debridement of mycotic and hypertrophic toenails, 1 through 5 bilateral and clearing of subungual debris. No ulceration, no infection noted.  Return Visit-Office Procedure: Patient instructed to return to the office for a follow up visit 3 months for continued evaluation and treatment.    Gardiner Barefoot DPM

## 2016-09-26 ENCOUNTER — Ambulatory Visit: Payer: Medicare Other | Admitting: Podiatry

## 2016-10-03 ENCOUNTER — Ambulatory Visit (INDEPENDENT_AMBULATORY_CARE_PROVIDER_SITE_OTHER): Payer: Medicare Other | Admitting: Podiatry

## 2016-10-03 DIAGNOSIS — B351 Tinea unguium: Secondary | ICD-10-CM | POA: Diagnosis not present

## 2016-10-03 DIAGNOSIS — M79676 Pain in unspecified toe(s): Secondary | ICD-10-CM | POA: Diagnosis not present

## 2016-10-03 NOTE — Progress Notes (Signed)
Complaint:  Visit Type: Patient returns to my office for continued preventative foot care services. Complaint: Patient states" my nails have grown long and thick and become painful to walk and wear shoes"  The patient presents for preventative foot care services. No changes to ROS  Podiatric Exam: Vascular: dorsalis pedis and posterior tibial pulses are palpable bilateral. Capillary return is immediate. Temperature gradient is WNL. Skin turgor WNL  Sensorium: Normal Semmes Weinstein monofilament test. Normal tactile sensation bilaterally. Nail Exam: Pt has thick disfigured discolored nails with subungual debris noted bilateral entire nail hallux through fifth toenails Ulcer Exam: There is no evidence of ulcer or pre-ulcerative changes or infection. Orthopedic Exam: Muscle tone and strength are WNL. No limitations in general ROM. No crepitus or effusions noted. Foot type and digits show no abnormalities. Forefoot equinus foot type with hammer toes 2-5  B/L Skin: No Porokeratosis. No infection or ulcers.  Dry heels.  Diagnosis:  Onychomycosis, , Pain in right toe, pain in left toes  Treatment & Plan Procedures and Treatment: Consent by patient was obtained for treatment procedures. The patient understood the discussion of treatment and procedures well. All questions were answered thoroughly reviewed. Debridement of mycotic and hypertrophic toenails, 1 through 5 bilateral and clearing of subungual debris. No ulceration, no infection noted.  Told to use vaseline and chapstick.  Return Visit-Office Procedure: Patient instructed to return to the office for a follow up visit 3 months for continued evaluation and treatment.    Gardiner Barefoot DPM

## 2016-10-12 ENCOUNTER — Ambulatory Visit: Payer: Medicare Other | Admitting: Adult Health

## 2016-10-12 ENCOUNTER — Telehealth: Payer: Self-pay | Admitting: Neurology

## 2016-10-12 NOTE — Telephone Encounter (Signed)
Called and spoke with patient. Scheduled appt for 10/14/16 at 930am, check in 900am. There was an opening where pt cx. She verbalized appreciation for call.

## 2016-10-12 NOTE — Telephone Encounter (Signed)
We will try to get the patient worked in next several weeks.

## 2016-10-12 NOTE — Telephone Encounter (Signed)
Dr [name]- please advise. Patient's last appt was with MM,NP on 10/13/15, a year ago.

## 2016-10-12 NOTE — Telephone Encounter (Signed)
Pt called said her husband was bringing her to appt today with Jinny Blossom but he got sick on the way and had to go back home. Pt said Dr Jacelyn Grip is wanting to do a nerve block on her spine but she is not willing to until talking with Dr Viona Gilmore about. Can she be worked into see him? Please call

## 2016-10-13 ENCOUNTER — Encounter: Payer: Self-pay | Admitting: Adult Health

## 2016-10-14 ENCOUNTER — Encounter: Payer: Self-pay | Admitting: Neurology

## 2016-10-14 ENCOUNTER — Ambulatory Visit (INDEPENDENT_AMBULATORY_CARE_PROVIDER_SITE_OTHER): Payer: Medicare Other | Admitting: Neurology

## 2016-10-14 VITALS — BP 135/78 | HR 90 | Wt 242.2 lb

## 2016-10-14 DIAGNOSIS — G63 Polyneuropathy in diseases classified elsewhere: Secondary | ICD-10-CM

## 2016-10-14 NOTE — Progress Notes (Signed)
Reason for visit: Peripheral neuropathy  Samantha Woodard is an 76 y.o. female  History of present illness:  Samantha Woodard is a 76 year old right-handed white female with a history of a peripheral neuropathy. The patient has been on gabapentin for this with some benefit. The patient still has some discomfort in the balls of the feet bilaterally. She has begun to have episodes of right-sided sciatica pain that starts in her right buttock and goes all the way down to the foot mainly into the right great toe. The patient will have sensation of weakness at the ankle when the pain is severe. She has been followed by Dr. Mina Marble for pain control, they are considering an injection for the pain. The patient also has occasional events where she feels a warm sensation down both legs with some transient weakness of the legs that may occur for several minutes. These episodes have occurred over several years, usually occurring once or twice a year, but the frequency has increased some recently occurring 2 or 3 times a year. The patient is unable to ambulate during these events. The patient denies any recent falls, she may use a cane for ambulation. She has bilateral knee arthritis, she will be going in for a left total knee replacement in June 2018. Eventually, the patient may need surgery on the right knee as well. She returns to the office today for an evaluation. She brings in a disc that includes an x-ray of the low back and MRI evaluation of the low back. The patient has scoliosis of the spine, and a prominent A/P curvature of the low back. The spinal canal is not severely compromised, no severe neuroforaminal stenosis is seen by my review.  Past Medical History:  Diagnosis Date  . Anemia   . Anxiety and depression   . Arthritis, degenerative   . Atrial fibrillation ([place])   . Cerebrovascular disease   . Chronic kidney disease   . Chronic low back pain   . Diverticulosis   . Dyslipidemia   . Fibromyalgia     . GERD (gastroesophageal reflux disease)   . HTN (hypertension)   . IBS (irritable bowel syndrome)   . Mild mitral valve prolapse   . Obesity   . OSA (obstructive sleep apnea)   . Peripheral neuropathy   . Peripheral vascular disease ([place])   . Positional vertigo   . Spina bifida occulta   . Stroke ([place])   . Varicose vein     Past Surgical History:  Procedure Laterality Date  . ABDOMINAL HYSTERECTOMY    . ENDOVENOUS ABLATION SAPHENOUS VEIN W/ LASER  10/22/2007  . gallbladder resection    . MINOR HEMORRHOIDECTOMY    . TONSILLECTOMY      Family History  Problem Relation Age of Onset  . Cancer Mother   . Hypertension Mother   . Varicose Veins Mother   . Heart attack Mother   . Heart attack Father   . Cancer Father   . Heart disease Father   . Hypertension Father   . Diabetes Sister   . Hypertension Sister   . Hyperlipidemia Sister   . Varicose Veins Sister   . Hypertension Brother   . Hyperlipidemia Brother   . Cerebrovascular Disease Maternal Grandmother   . Stroke Paternal Grandmother   . Hypertension Sister   . Hyperlipidemia Sister   . Varicose Veins Sister   . Carpal tunnel syndrome Sister     Social history:  reports that she has never  smoked. She has never used smokeless tobacco. She reports that she does not drink alcohol or use drugs.    Allergies  Allergen Reactions  . Iodine   . Neomycin   . Tape     Medications:  Prior to Admission medications   Medication Sig Start Date End Date Taking? Authorizing Provider  acetaminophen (TYLENOL) 500 MG tablet Take by mouth every 6 (six) hours as needed.    Yes [provider]  aspirin 81 MG tablet Take 81 mg by mouth daily.   Yes [provider]  carisoprodol (SOMA) 350 MG tablet Take 350 mg by mouth as needed for muscle spasms (Patient usually only take 1/2 tablet).    Yes [provider]  celecoxib (CELEBREX) 200 MG capsule Take 200 mg by mouth daily.  10/04/13  Yes [provider]  cetirizine (ZYRTEC) 10 MG tablet Take 10 mg by mouth daily.   Yes [provider]  clopidogrel (PLAVIX) 75 MG tablet Take 0.5 tablets (37.5 mg total) by mouth daily. 10/13/15  Yes Ward Givens, NP  fluticasone (FLONASE) 50 MCG/ACT nasal spray Place 2 sprays into both nostrils daily. 08/16/13  Yes [provider]  gabapentin (NEURONTIN) 600 MG tablet Take 1 tablet (600 mg total) by mouth 3 (three) times daily. 10/13/15  Yes Ward Givens, NP  hydrochlorothiazide (HYDRODIURIL) 12.5 MG tablet Take 6.25 mg by mouth daily.  07/18/13  Yes [provider]  lisinopril (PRINIVIL,ZESTRIL) 5 MG tablet Take 2.5 mg by mouth daily. 07/17/13  Yes [provider]  pantoprazole (PROTONIX) 40 MG tablet Take 40 mg by mouth daily. 07/26/13  Yes [provider]  Probiotic Product (PROBIOTIC DAILY PO) Take by mouth.   Yes [provider]  simvastatin (ZOCOR) 20 MG tablet Take 20 mg by mouth daily. 09/03/13  Yes [provider]  Vitamins/Minerals TABS Take by mouth.   Yes [provider]  Wheat Dextrin (BENEFIBER) POWD Take by mouth.   Yes [provider]    ROS:  Out of a complete 14 system review of symptoms, the patient complains only of the following symptoms, and all other reviewed systems are negative.  Activity change Eye itching, eye redness Leg swelling Incontinence of the bladder, urinary urgency Sleep apnea, frequent waking, snoring Joint pain, joint swelling, back pain, achy muscles, muscle cramps, walking difficulty Moles Numbness, weakness   Blood pressure 135/78, pulse 90, weight 242 lb 3.2 oz (109.9 kg).  Physical Exam  General: The patient is alert and cooperative at the time of the examination.The patient is markedly obese.  Neuromuscular: The patient has a fairly normal ability to flex the low back.  Skin: No significant peripheral edema is noted.   Neurologic Exam  Mental status: The  patient is alert and oriented x 3 at the time of the examination. The patient has apparent normal recent and remote memory, with an apparently normal attention span and concentration ability.   Cranial nerves: Facial symmetry is present. Speech is normal, no aphasia or dysarthria is noted. Extraocular movements are full. Visual fields are full.  Motor: The patient has good strength in all 4 extremities.  Sensory examination: Soft touch sensation is symmetric on the face, arms, and legs.  Coordination: The patient has good finger-nose-finger and heel-to-shin bilaterally.  Gait and station: The patient has a normal gait. Tandem gait is slightly unsteady. Romberg is negative. No drift is seen.The patient is able to walk on the toes bilaterally, not able to walk on heels  bilaterally.  Reflexes: Deep tendon reflexes are symmetric, But are depressed.   Assessment/Plan:  1. Peripheral neuropathy   2. Right sided sciatica   The patient will be going for surgery on her left knee in the near future. The patient is being considered for a nerve block or epidural injection by Dr. Mina Marble, I see no contraindication for this. The episodes of bilateral leg warm sensations and weakness are of unclear etiology. They have been going on for several years and are relatively infrequent. The patient will follow-up through this office in 6 months. She will continue the gabapentin.    Jill Alexanders MD 10/14/2016 10:04 AM  [facility] [address] [place], [place] [address]  Phone [phone] Fax [phone]

## 2016-10-17 ENCOUNTER — Telehealth: Payer: Self-pay | Admitting: Neurology

## 2016-10-17 NOTE — Telephone Encounter (Signed)
The patient is being considered for a right L4-5 selective nerve root block. The patient may come off of Plavix for 5-7 days prior to the procedure.

## 2016-10-28 ENCOUNTER — Telehealth: Payer: Self-pay | Admitting: Neurology

## 2016-10-28 MED ORDER — CLOPIDOGREL BISULFATE 75 MG PO TABS
37.5000 mg | ORAL_TABLET | Freq: Every day | ORAL | 3 refills | Status: DC
Start: 2016-10-28 — End: 2017-11-22

## 2016-10-28 NOTE — Telephone Encounter (Signed)
Pt called for refill of clopidogrel (PLAVIX) 75 MG tablet  Please send through [place], [place]   ** Pt said it can wait until Tues of next week**

## 2016-10-28 NOTE — Addendum Note (Signed)
Addended by: Hope Pigeon on: 10/28/2016 09:43 AM   Modules accepted: Orders

## 2016-10-28 NOTE — Telephone Encounter (Signed)
[facility].

## 2016-11-10 NOTE — H&P (Signed)
TOTAL KNEE ADMISSION H&P  Patient is being admitted for left total knee arthroplasty.  Subjective:  Chief Complaint:left knee pain.  HPI: Samantha Woodard, 76 y.o. female, has a history of pain and functional disability in the left knee due to arthritis and has failed non-surgical conservative treatments for greater than 12 weeks to includeNSAID's and/or analgesics, corticosteriod injections, viscosupplementation injections, flexibility and strengthening excercises, use of assistive devices, weight reduction as appropriate and activity modification.  Onset of symptoms was gradual, starting 2 years ago with gradually worsening course since that time. The patient noted no past surgery on the left knee(s).  Patient currently rates pain in the left knee(s) at 10 out of 10 with activity. Patient has night pain, worsening of pain with activity and weight bearing, pain that interferes with activities of daily living, pain with passive range of motion, crepitus and joint swelling.  Patient has evidence of subchondral sclerosis and joint space narrowing by imaging studies.  There is no active infection.  Patient Active Problem List   Diagnosis Date Noted  . Hx of colonic polyps 03/27/2014  . Varicose veins of lower extremities with other complications 26/71/2458  . Platelet inhibition due to Plavix 12/05/2013  . Fecal incontinence 11/26/2013  . Internal prolapsed hemorrhoids 11/26/2013  . Cerebrovascular disease 10/14/2013  . Polyneuropathy in other diseases classified elsewhere ([place]) 10/14/2013   Past Medical History:  Diagnosis Date  . Anemia   . Anxiety and depression   . Arthritis, degenerative   . Atrial fibrillation ([place])   . Cerebrovascular disease   . Chronic kidney disease   . Chronic low back pain   . Diverticulosis   . Dyslipidemia   . Fibromyalgia   . GERD (gastroesophageal reflux disease)   . HTN (hypertension)   . IBS (irritable bowel syndrome)   . Mild mitral valve prolapse    . Obesity   . OSA (obstructive sleep apnea)   . Peripheral neuropathy   . Peripheral vascular disease ([place])   . Positional vertigo   . Spina bifida occulta   . Stroke ([place])   . Varicose vein     Past Surgical History:  Procedure Laterality Date  . ABDOMINAL HYSTERECTOMY    . ENDOVENOUS ABLATION SAPHENOUS VEIN W/ LASER  10/22/2007  . gallbladder resection    . MINOR HEMORRHOIDECTOMY    . TONSILLECTOMY      No prescriptions prior to admission.   Allergies  Allergen Reactions  . Iodine   . Neomycin   . Tape     Social History  Substance Use Topics  . Smoking status: Never Smoker  . Smokeless tobacco: Never Used  . Alcohol use No     Comment: on occasion    Family History  Problem Relation Age of Onset  . Cancer Mother   . Hypertension Mother   . Varicose Veins Mother   . Heart attack Mother   . Heart attack Father   . Cancer Father   . Heart disease Father   . Hypertension Father   . Diabetes Sister   . Hypertension Sister   . Hyperlipidemia Sister   . Varicose Veins Sister   . Hypertension Brother   . Hyperlipidemia Brother   . Cerebrovascular Disease Maternal Grandmother   . Stroke Paternal Grandmother   . Hypertension Sister   . Hyperlipidemia Sister   . Varicose Veins Sister   . Carpal tunnel syndrome Sister      Review of Systems  Constitutional: Positive for chills, fever and  malaise/fatigue.  HENT:       Sinus problems  Eyes: Negative.   Respiratory: Negative.   Cardiovascular: Positive for leg swelling.       Htn  Gastrointestinal: Positive for diarrhea and heartburn.  Genitourinary: Positive for urgency.       Poor bladder control  Musculoskeletal: Positive for joint pain and myalgias.  Neurological: Positive for focal weakness.  Endo/Heme/Allergies: Negative.   Psychiatric/Behavioral: Negative.     Objective:  Physical Exam  Constitutional: She is oriented to person, place, and time. She appears well-developed and well-nourished.   HENT:  Head: Normocephalic and atraumatic.  Eyes: Pupils are equal, round, and reactive to light.  Neck: Normal range of motion. Neck supple.  Cardiovascular: Intact distal pulses.   Respiratory: Effort normal.  Musculoskeletal: She exhibits edema.  patient has a range of motion from 5 to 115 in bilateral knees.  Tenderness over the medial lateral joint lines of the left knee.  No instability.  She does have tenderness over the medial lateral joint lines of the right knee also.  No instability.  Obvious crepitus with range of motion.  Calves are soft and nontender.  She is neurovascularly intact distally.  Neurological: She is alert and oriented to person, place, and time.  Skin: Skin is warm and dry.    Vital signs in last 24 hours:    Labs:   Estimated body mass index is 35.77 kg/m as calculated from the following:   Height as of 10/13/15: 5\' 9"  (1.753 m).   Weight as of 10/14/16: 109.9 kg (242 lb 3.2 oz).   Imaging Review Plain radiographs demonstrate  bilateral AP weightbearing, bilateral Rosenberg, lateral sunrise views of bilateral knees are taken and reviewed in office today.  Patient has a windswept deformity with lateral compartment arthritis of the left knee and medial compartment arthritis of the right knee.  Assessment/Plan:  End stage arthritis, left knee   The patient history, physical examination, clinical judgment of the provider and imaging studies are consistent with end stage degenerative joint disease of the left knee(s) and total knee arthroplasty is deemed medically necessary. The treatment options including medical management, injection therapy arthroscopy and arthroplasty were discussed at length. The risks and benefits of total knee arthroplasty were presented and reviewed. The risks due to aseptic loosening, infection, stiffness, patella tracking problems, thromboembolic complications and other imponderables were discussed. The patient acknowledged the  explanation, agreed to proceed with the plan and consent was signed. Patient is being admitted for inpatient treatment for surgery, pain control, PT, OT, prophylactic antibiotics, VTE prophylaxis, progressive ambulation and ADL's and discharge planning. The patient is planning to be discharged home with home health services

## 2016-11-16 ENCOUNTER — Encounter (HOSPITAL_COMMUNITY)
Admission: RE | Admit: 2016-11-16 | Discharge: 2016-11-16 | Disposition: A | Discharge status: Home / Self Care | Payer: Medicare Other | Source: Ambulatory Visit | Attending: Orthopedic Surgery | Admitting: Orthopedic Surgery

## 2016-11-16 ENCOUNTER — Ambulatory Visit (HOSPITAL_COMMUNITY)
Admission: RE | Admit: 2016-11-16 | Discharge: 2016-11-16 | Disposition: A | Discharge status: Home / Self Care | Payer: Medicare Other | Source: Ambulatory Visit | Attending: Orthopedic Surgery | Admitting: Orthopedic Surgery

## 2016-11-16 ENCOUNTER — Encounter (HOSPITAL_COMMUNITY): Payer: Self-pay

## 2016-11-16 DIAGNOSIS — Z01818 Encounter for other preprocedural examination: Secondary | ICD-10-CM

## 2016-11-16 DIAGNOSIS — Z01812 Encounter for preprocedural laboratory examination: Secondary | ICD-10-CM | POA: Insufficient documentation

## 2016-11-16 DIAGNOSIS — Z01810 Encounter for preprocedural cardiovascular examination: Secondary | ICD-10-CM | POA: Insufficient documentation

## 2016-11-16 HISTORY — DX: Adverse effect of unspecified anesthetic, initial encounter: T41.45XA

## 2016-11-16 HISTORY — DX: Other complications of anesthesia, initial encounter: T88.59XA

## 2016-11-16 LAB — PROTIME-INR
INR: 0.91
Prothrombin Time: 12.3 seconds (ref 11.4–15.2)

## 2016-11-16 LAB — BASIC METABOLIC PANEL
Anion gap: 9 (ref 5–15)
BUN: 17 mg/dL (ref 6–20)
CO2: 27 mmol/L (ref 22–32)
Calcium: 9.4 mg/dL (ref 8.9–10.3)
Chloride: 104 mmol/L (ref 101–111)
Creatinine, Ser: 1.33 mg/dL — ABNORMAL HIGH (ref 0.44–1.00)
GFR calc Af Amer: 43 mL/min — ABNORMAL LOW (ref >60)
GFR, EST NON AFRICAN AMERICAN: 37 mL/min — AB (ref >60)
GLUCOSE: 85 mg/dL (ref 65–99)
POTASSIUM: 4.1 mmol/L (ref 3.5–5.1)
Sodium: 140 mmol/L (ref 135–145)

## 2016-11-16 LAB — CBC WITH DIFFERENTIAL/PLATELET
Basophils Absolute: 0 10*3/uL (ref 0.0–0.1)
Basophils Relative: 1 %
EOS PCT: 4 %
Eosinophils Absolute: 0.2 10*3/uL (ref 0.0–0.7)
HEMATOCRIT: 38.7 % (ref 36.0–46.0)
Hemoglobin: 12 g/dL (ref 12.0–15.0)
LYMPHS PCT: 33 %
Lymphs Abs: 2.1 10*3/uL (ref 0.7–4.0)
MCH: 28.7 pg (ref 26.0–34.0)
MCHC: 31 g/dL (ref 30.0–36.0)
MCV: 92.6 fL (ref 78.0–100.0)
MONOS PCT: 7 %
Monocytes Absolute: 0.4 10*3/uL (ref 0.1–1.0)
Neutro Abs: 3.6 10*3/uL (ref 1.7–7.7)
Neutrophils Relative %: 55 %
PLATELETS: 325 10*3/uL (ref 150–400)
RBC: 4.18 MIL/uL (ref 3.87–5.11)
RDW: 15 % (ref 11.5–15.5)
WBC: 6.5 10*3/uL (ref 4.0–10.5)

## 2016-11-16 LAB — URINALYSIS, ROUTINE W REFLEX MICROSCOPIC
Bilirubin Urine: NEGATIVE
GLUCOSE, UA: NEGATIVE mg/dL
Hgb urine dipstick: NEGATIVE
KETONES UR: NEGATIVE mg/dL
Nitrite: NEGATIVE
PROTEIN: NEGATIVE mg/dL
Specific Gravity, Urine: 1.016 (ref 1.005–1.030)
pH: 6 (ref 5.0–8.0)

## 2016-11-16 LAB — SURGICAL PCR SCREEN
MRSA, PCR: NEGATIVE
STAPHYLOCOCCUS AUREUS: NEGATIVE

## 2016-11-16 LAB — TYPE AND SCREEN
ABO/RH(D): O POS
ANTIBODY SCREEN: NEGATIVE

## 2016-11-16 LAB — ABO/RH: ABO/RH(D): O POS

## 2016-11-16 LAB — APTT: APTT: 30 s (ref 24–36)

## 2016-11-16 NOTE — Progress Notes (Signed)
Call to [facility] at Dr. Damita Dunnings office , requesting directive for pt.'s plavix schedule in light of pending surgery .  She will give msg. To [place], [place] & someone will call the pt.

## 2016-11-16 NOTE — Pre-Procedure Instructions (Signed)
Samantha Woodard  11/16/2016      [facility] - [place], [place] [address] Phone: [phone] Fax: [phone]  [place] [address] [place] [address] Phone: [phone] Fax: [phone]  [place], [place] [address] Phone: [phone] Fax: [phone]    Your procedure is scheduled on 11/28/2016  Report to [facility] Admitting at 8:15A.M.  Call this number if you have problems the morning of surgery:  [phone]   Remember:  Do not eat food or drink liquids after midnight.  On Sunday   Take these medicines the morning of surgery with A SIP OF WATER : Protonix, Gabapentin   Do not wear jewelry, make-up or nail polish.   Do not wear lotions, powders, or perfumes, or deoderant.   Do not shave 48 hours prior to surgery.    Do not bring valuables to the hospital.   [facility] is not responsible for any belongings or valuables.  Contacts, dentures or bridgework may not be worn into surgery.  Leave your suitcase in the car.  After surgery it may be brought to your room.  For patients admitted to the hospital, discharge time will be determined by your treatment team.  Patients discharged the day of surgery will not be allowed to drive home.   Name and phone number of your driver:   With family  Special instructions:  Special Instructions: [place] - Preparing for Surgery  Before surgery, you can play an important role.  Because skin is not sterile, your skin needs to be as free of germs as possible.  You can reduce the number of germs on you skin by washing with CHG (chlorahexidine gluconate) soap before surgery.  CHG is an antiseptic cleaner which kills germs and bonds with the skin to continue killing germs even after washing.  Please DO NOT use if you have  an allergy to CHG or antibacterial soaps.  If your skin becomes reddened/irritated stop using the CHG and inform your nurse when you arrive at Short Stay.  Do not shave (including legs and underarms) for at least 48 hours prior to the first CHG shower.  You may shave your face.  Please follow these instructions carefully:   1.  Shower with CHG Soap the night before surgery and the  morning of Surgery.  2.  If you choose to wash your hair, wash your hair first as usual with your  normal shampoo.  3.  After you shampoo, rinse your hair and body thoroughly to remove the  Shampoo.  4.  Use CHG as you would any other liquid soap.  You can apply chg directly to the skin and wash gently with scrungie or a clean washcloth.  5.  Apply the CHG Soap to your body ONLY FROM THE NECK DOWN.    Do not use on open wounds or open sores.  Avoid contact with your eyes, ears, mouth and genitals (private parts).  Wash genitals (private parts)   with your normal soap.  6.  Wash thoroughly, paying special attention to the area where your surgery will be performed.  7.  Thoroughly rinse your body with warm water from the neck down.  8.  DO NOT shower/wash with your normal soap after using and rinsing off  the CHG Soap.  9.  Pat yourself dry with a clean towel.            10.  Wear clean pajamas.            11.  Place clean sheets on your bed the night of your first shower and do not sleep with pets.  Day of Surgery  Do not apply any lotions/deodorants the morning of surgery.  Please wear clean clothes to the hospital/surgery center.  Please read over the following fact sheets that you were given. Pain Booklet, Coughing and Deep Breathing, MRSA Information and Surgical Site Infection Prevention

## 2016-11-21 ENCOUNTER — Telehealth: Payer: Self-pay | Admitting: Neurology

## 2016-11-21 MED ORDER — GABAPENTIN 600 MG PO TABS: 600.0000 mg | ORAL_TABLET | Freq: Three times a day (TID) | ORAL | 3 refills | Status: DC

## 2016-11-21 NOTE — Telephone Encounter (Signed)
E-scribed refills to pt pharmacy as requested.  

## 2016-11-21 NOTE — Telephone Encounter (Signed)
Pt request refill for gabapentin (NEURONTIN) 600 MG tablet 90 day supply sent to [facility].

## 2016-11-25 MED ORDER — DEXTROSE-NACL 5-0.45 % IV SOLN: INTRAVENOUS | Status: DC

## 2016-11-25 MED ORDER — CEFAZOLIN SODIUM-DEXTROSE 2-4 GM/100ML-% IV SOLN
2.0000 g | INTRAVENOUS | Status: AC
  Administered 2016-11-28: 2 g via INTRAVENOUS
  Filled 2016-11-25: qty 100

## 2016-11-25 MED ORDER — TRANEXAMIC ACID 1000 MG/10ML IV SOLN
2000.0000 mg | INTRAVENOUS | Status: AC
  Administered 2016-11-28: 2000 mg via TOPICAL
  Filled 2016-11-25: qty 20

## 2016-11-27 DIAGNOSIS — M1712 Unilateral primary osteoarthritis, left knee: Secondary | ICD-10-CM | POA: Diagnosis present

## 2016-11-27 NOTE — Anesthesia Preprocedure Evaluation (Addendum)
Anesthesia Evaluation  Patient identified by MRN, date of birth, ID band Patient awake    Reviewed: Allergy & Precautions, H&P , NPO status , Patient's Chart, lab work & pertinent test results  Airway Mallampati: II  TM Distance: >3 FB Neck ROM: Full    Dental no notable dental hx. (+) Teeth Intact, Dental Advisory Given   Pulmonary sleep apnea and Continuous Positive Airway Pressure Ventilation ,    Pulmonary exam normal breath sounds clear to auscultation       Cardiovascular Exercise Tolerance: Good hypertension, Pt. on medications + Peripheral Vascular Disease   Rhythm:Regular Rate:Normal     Neuro/Psych PSYCHIATRIC DISORDERS Anxiety Depression CVA    GI/Hepatic Neg liver ROS, GERD  Medicated and Controlled,  Endo/Other  negative endocrine ROS  Renal/GU Renal InsufficiencyRenal disease  negative genitourinary   Musculoskeletal  (+) Arthritis , Osteoarthritis,  Fibromyalgia -  Abdominal   Peds  Hematology negative hematology ROS (+) anemia ,   Anesthesia Other Findings   Reproductive/Obstetrics negative OB ROS                           Anesthesia Physical Anesthesia Plan  ASA: III  Anesthesia Plan: General   Post-op Pain Management:  Regional for Post-op pain   Induction: Intravenous  PONV Risk Score and Plan: 4 or greater and Ondansetron, Dexamethasone, Propofol and Midazolam  Airway Management Planned: LMA  Additional Equipment:   Intra-op Plan:   Post-operative Plan: Extubation in OR  Informed Consent: I have reviewed the patients History and Physical, chart, labs and discussed the procedure including the risks, benefits and alternatives for the proposed anesthesia with the patient or authorized representative who has indicated his/her understanding and acceptance.   Dental advisory given  Plan Discussed with: CRNA  Anesthesia Plan Comments:       Anesthesia  Quick Evaluation

## 2016-11-28 ENCOUNTER — Encounter (HOSPITAL_COMMUNITY): Payer: Self-pay | Admitting: *Deleted

## 2016-11-28 ENCOUNTER — Encounter (HOSPITAL_COMMUNITY)
Admission: RE | Disposition: A | Discharge status: Home Health | Payer: Self-pay | Source: Ambulatory Visit | Attending: Orthopedic Surgery

## 2016-11-28 ENCOUNTER — Inpatient Hospital Stay (HOSPITAL_COMMUNITY): Payer: Medicare Other | Admitting: Anesthesiology

## 2016-11-28 ENCOUNTER — Inpatient Hospital Stay (HOSPITAL_COMMUNITY)
Admission: RE | Admit: 2016-11-28 | Discharge: 2016-11-30 | DRG: 470 | Disposition: A | Discharge status: Home Health | Payer: Medicare Other | Source: Ambulatory Visit | Attending: Orthopedic Surgery | Admitting: Orthopedic Surgery

## 2016-11-28 DIAGNOSIS — Z8673 Personal history of transient ischemic attack (TIA), and cerebral infarction without residual deficits: Secondary | ICD-10-CM

## 2016-11-28 DIAGNOSIS — G4733 Obstructive sleep apnea (adult) (pediatric): Secondary | ICD-10-CM | POA: Diagnosis present

## 2016-11-28 DIAGNOSIS — Z8349 Family history of other endocrine, nutritional and metabolic diseases: Secondary | ICD-10-CM

## 2016-11-28 DIAGNOSIS — K219 Gastro-esophageal reflux disease without esophagitis: Secondary | ICD-10-CM | POA: Diagnosis present

## 2016-11-28 DIAGNOSIS — E785 Hyperlipidemia, unspecified: Secondary | ICD-10-CM | POA: Diagnosis present

## 2016-11-28 DIAGNOSIS — Z8249 Family history of ischemic heart disease and other diseases of the circulatory system: Secondary | ICD-10-CM

## 2016-11-28 DIAGNOSIS — D62 Acute posthemorrhagic anemia: Secondary | ICD-10-CM | POA: Diagnosis not present

## 2016-11-28 DIAGNOSIS — M1712 Unilateral primary osteoarthritis, left knee: Secondary | ICD-10-CM | POA: Diagnosis present

## 2016-11-28 DIAGNOSIS — I739 Peripheral vascular disease, unspecified: Secondary | ICD-10-CM | POA: Diagnosis present

## 2016-11-28 DIAGNOSIS — M797 Fibromyalgia: Secondary | ICD-10-CM | POA: Diagnosis present

## 2016-11-28 DIAGNOSIS — Q760 Spina bifida occulta: Secondary | ICD-10-CM | POA: Diagnosis not present

## 2016-11-28 DIAGNOSIS — G629 Polyneuropathy, unspecified: Secondary | ICD-10-CM | POA: Diagnosis present

## 2016-11-28 DIAGNOSIS — Z7902 Long term (current) use of antithrombotics/antiplatelets: Secondary | ICD-10-CM | POA: Diagnosis not present

## 2016-11-28 DIAGNOSIS — I10 Essential (primary) hypertension: Secondary | ICD-10-CM | POA: Diagnosis present

## 2016-11-28 DIAGNOSIS — Z833 Family history of diabetes mellitus: Secondary | ICD-10-CM

## 2016-11-28 HISTORY — DX: Scoliosis, unspecified: M41.9

## 2016-11-28 HISTORY — PX: TOTAL KNEE ARTHROPLASTY: SHX125

## 2016-11-28 SURGERY — ARTHROPLASTY, KNEE, TOTAL
Anesthesia: General | Site: Knee | Laterality: Left

## 2016-11-28 MED ORDER — NAPHAZOLINE-GLYCERIN 0.012-0.2 % OP SOLN: 2.0000 [drp] | Freq: Four times a day (QID) | OPHTHALMIC | Status: DC | PRN

## 2016-11-28 MED ORDER — CLOPIDOGREL BISULFATE 75 MG PO TABS
37.5000 mg | ORAL_TABLET | Freq: Every day | ORAL | Status: DC
  Administered 2016-11-28 – 2016-11-30 (×3): 37.5 mg via ORAL
  Filled 2016-11-28 (×3): qty 1

## 2016-11-28 MED ORDER — ONDANSETRON HCL 4 MG/2ML IJ SOLN: 4.0000 mg | Freq: Four times a day (QID) | INTRAMUSCULAR | Status: DC | PRN

## 2016-11-28 MED ORDER — ROPIVACAINE HCL 5 MG/ML IJ SOLN
INTRAMUSCULAR | Status: DC | PRN
  Administered 2016-11-28: 30 mL via PERINEURAL

## 2016-11-28 MED ORDER — PHENOL 1.4 % MT LIQD: 1.0000 | OROMUCOSAL | Status: DC | PRN

## 2016-11-28 MED ORDER — GABAPENTIN 600 MG PO TABS
600.0000 mg | ORAL_TABLET | Freq: Three times a day (TID) | ORAL | Status: DC
  Administered 2016-11-28 – 2016-11-30 (×6): 600 mg via ORAL
  Filled 2016-11-28 (×6): qty 1

## 2016-11-28 MED ORDER — DIPHENHYDRAMINE HCL 12.5 MG/5ML PO ELIX: 12.5000 mg | ORAL_SOLUTION | ORAL | Status: DC | PRN

## 2016-11-28 MED ORDER — DOCUSATE SODIUM 100 MG PO CAPS
100.0000 mg | ORAL_CAPSULE | Freq: Two times a day (BID) | ORAL | Status: DC
  Administered 2016-11-28 – 2016-11-30 (×4): 100 mg via ORAL
  Filled 2016-11-28 (×4): qty 1

## 2016-11-28 MED ORDER — ACETAMINOPHEN 500 MG PO TABS
1000.0000 mg | ORAL_TABLET | Freq: Four times a day (QID) | ORAL | Status: DC | PRN
  Administered 2016-11-28: 1000 mg via ORAL

## 2016-11-28 MED ORDER — OXYCODONE-ACETAMINOPHEN 5-325 MG PO TABS: 1.0000 | ORAL_TABLET | ORAL | 0 refills | Status: DC | PRN

## 2016-11-28 MED ORDER — FENTANYL CITRATE (PF) 250 MCG/5ML IJ SOLN
INTRAMUSCULAR | Status: AC
  Filled 2016-11-28: qty 5

## 2016-11-28 MED ORDER — DEXAMETHASONE SODIUM PHOSPHATE 10 MG/ML IJ SOLN
INTRAMUSCULAR | Status: DC | PRN
  Administered 2016-11-28: 10 mg via INTRAVENOUS

## 2016-11-28 MED ORDER — ASPIRIN EC 325 MG PO TBEC
325.0000 mg | DELAYED_RELEASE_TABLET | Freq: Every day | ORAL | Status: DC
  Administered 2016-11-29 – 2016-11-30 (×2): 325 mg via ORAL
  Filled 2016-11-28 (×3): qty 1

## 2016-11-28 MED ORDER — PROPOFOL 1000 MG/100ML IV EMUL
INTRAVENOUS | Status: AC
  Filled 2016-11-28: qty 100

## 2016-11-28 MED ORDER — FENTANYL CITRATE (PF) 100 MCG/2ML IJ SOLN
25.0000 ug | INTRAMUSCULAR | Status: DC | PRN
  Administered 2016-11-28 (×4): 25 ug via INTRAVENOUS

## 2016-11-28 MED ORDER — BUPIVACAINE LIPOSOME 1.3 % IJ SUSP
20.0000 mL | Freq: Once | INTRAMUSCULAR | Status: AC
  Administered 2016-11-28: 20 mL
  Filled 2016-11-28: qty 20

## 2016-11-28 MED ORDER — LORATADINE 10 MG PO TABS
10.0000 mg | ORAL_TABLET | Freq: Every day | ORAL | Status: DC
  Administered 2016-11-29 – 2016-11-30 (×2): 10 mg via ORAL
  Filled 2016-11-28 (×3): qty 1

## 2016-11-28 MED ORDER — POLYETHYLENE GLYCOL 3350 17 G PO PACK: 17.0000 g | PACK | Freq: Every day | ORAL | Status: DC | PRN

## 2016-11-28 MED ORDER — TRANEXAMIC ACID 1000 MG/10ML IV SOLN
1000.0000 mg | Freq: Once | INTRAVENOUS | Status: AC
  Administered 2016-11-28: 1000 mg via INTRAVENOUS
  Filled 2016-11-28: qty 10

## 2016-11-28 MED ORDER — BUPIVACAINE-EPINEPHRINE (PF) 0.25% -1:200000 IJ SOLN
INTRAMUSCULAR | Status: AC
  Filled 2016-11-28: qty 60

## 2016-11-28 MED ORDER — CELECOXIB 200 MG PO CAPS
200.0000 mg | ORAL_CAPSULE | Freq: Every day | ORAL | Status: DC
  Administered 2016-11-29 – 2016-11-30 (×2): 200 mg via ORAL
  Filled 2016-11-28 (×2): qty 1

## 2016-11-28 MED ORDER — HYDROCHLOROTHIAZIDE 25 MG PO TABS
12.5000 mg | ORAL_TABLET | Freq: Every day | ORAL | Status: DC
  Administered 2016-11-29 – 2016-11-30 (×2): 12.5 mg via ORAL
  Filled 2016-11-28 (×2): qty 1

## 2016-11-28 MED ORDER — LACTATED RINGERS IV SOLN
INTRAVENOUS | Status: DC
  Administered 2016-11-28 (×2): via INTRAVENOUS

## 2016-11-28 MED ORDER — DEXAMETHASONE SODIUM PHOSPHATE 10 MG/ML IJ SOLN
10.0000 mg | Freq: Once | INTRAMUSCULAR | Status: AC
  Administered 2016-11-29: 10 mg via INTRAVENOUS
  Filled 2016-11-28: qty 1

## 2016-11-28 MED ORDER — KCL IN DEXTROSE-NACL 20-5-0.45 MEQ/L-%-% IV SOLN
INTRAVENOUS | Status: DC
  Administered 2016-11-28: 21:00:00 via INTRAVENOUS
  Filled 2016-11-28: qty 1000

## 2016-11-28 MED ORDER — SODIUM CHLORIDE 0.9 % IR SOLN
Status: DC | PRN
  Administered 2016-11-28: 3000 mL

## 2016-11-28 MED ORDER — FENTANYL CITRATE (PF) 100 MCG/2ML IJ SOLN
INTRAMUSCULAR | Status: DC | PRN
  Administered 2016-11-28: 25 ug via INTRAVENOUS

## 2016-11-28 MED ORDER — METOCLOPRAMIDE HCL 5 MG PO TABS
5.0000 mg | ORAL_TABLET | Freq: Three times a day (TID) | ORAL | Status: DC | PRN
Start: 2016-11-28 — End: 2016-11-30

## 2016-11-28 MED ORDER — ALUMINUM HYDROXIDE GEL 320 MG/5ML PO SUSP: 15.0000 mL | ORAL | Status: DC | PRN

## 2016-11-28 MED ORDER — BISACODYL 5 MG PO TBEC: 5.0000 mg | DELAYED_RELEASE_TABLET | Freq: Every day | ORAL | Status: DC | PRN

## 2016-11-28 MED ORDER — PROPOFOL 10 MG/ML IV BOLUS
INTRAVENOUS | Status: AC
  Filled 2016-11-28: qty 20

## 2016-11-28 MED ORDER — SIMVASTATIN 20 MG PO TABS
20.0000 mg | ORAL_TABLET | Freq: Every evening | ORAL | Status: DC
  Administered 2016-11-28 – 2016-11-29 (×2): 20 mg via ORAL
  Filled 2016-11-28 (×2): qty 1

## 2016-11-28 MED ORDER — METHOCARBAMOL 1000 MG/10ML IJ SOLN: 500.0000 mg | Freq: Four times a day (QID) | INTRAVENOUS | Status: DC | PRN

## 2016-11-28 MED ORDER — FLEET ENEMA 7-19 GM/118ML RE ENEM: 1.0000 | ENEMA | Freq: Once | RECTAL | Status: DC | PRN

## 2016-11-28 MED ORDER — FLUTICASONE PROPIONATE 50 MCG/ACT NA SUSP
2.0000 | Freq: Every day | NASAL | Status: DC
  Administered 2016-11-29: 2 via NASAL
  Filled 2016-11-28 (×2): qty 16

## 2016-11-28 MED ORDER — OXYCODONE HCL 5 MG PO TABS
5.0000 mg | ORAL_TABLET | ORAL | Status: DC | PRN
  Administered 2016-11-28 – 2016-11-30 (×7): 10 mg via ORAL
  Filled 2016-11-28 (×8): qty 2

## 2016-11-28 MED ORDER — TRANEXAMIC ACID 1000 MG/10ML IV SOLN
1000.0000 mg | INTRAVENOUS | Status: AC
  Administered 2016-11-28: 1000 mg via INTRAVENOUS
  Filled 2016-11-28: qty 10

## 2016-11-28 MED ORDER — MIDAZOLAM HCL 2 MG/2ML IJ SOLN
INTRAMUSCULAR | Status: AC
  Filled 2016-11-28: qty 2

## 2016-11-28 MED ORDER — MENTHOL 3 MG MT LOZG: 1.0000 | LOZENGE | OROMUCOSAL | Status: DC | PRN

## 2016-11-28 MED ORDER — ONDANSETRON HCL 4 MG/2ML IJ SOLN
INTRAMUSCULAR | Status: DC | PRN
  Administered 2016-11-28: 4 mg via INTRAVENOUS

## 2016-11-28 MED ORDER — FENTANYL CITRATE (PF) 100 MCG/2ML IJ SOLN
INTRAMUSCULAR | Status: AC
  Filled 2016-11-28: qty 2

## 2016-11-28 MED ORDER — PANTOPRAZOLE SODIUM 40 MG PO TBEC
40.0000 mg | DELAYED_RELEASE_TABLET | Freq: Every day | ORAL | Status: DC
  Administered 2016-11-29 – 2016-11-30 (×2): 40 mg via ORAL
  Filled 2016-11-28 (×3): qty 1

## 2016-11-28 MED ORDER — SODIUM CHLORIDE 0.9 % IJ SOLN
INTRAMUSCULAR | Status: DC | PRN
  Administered 2016-11-28: 50 mL

## 2016-11-28 MED ORDER — ACETAMINOPHEN 325 MG PO TABS: 650.0000 mg | ORAL_TABLET | Freq: Four times a day (QID) | ORAL | Status: DC | PRN

## 2016-11-28 MED ORDER — HYDROCORTISONE 1 % EX OINT: TOPICAL_OINTMENT | Freq: Two times a day (BID) | CUTANEOUS | Status: DC | PRN

## 2016-11-28 MED ORDER — MIDAZOLAM HCL 2 MG/2ML IJ SOLN
INTRAMUSCULAR | Status: AC
  Administered 2016-11-28: 1 mg via INTRAVENOUS
  Filled 2016-11-28: qty 2

## 2016-11-28 MED ORDER — ACETAMINOPHEN 500 MG PO TABS
ORAL_TABLET | ORAL | Status: AC
  Filled 2016-11-28: qty 2

## 2016-11-28 MED ORDER — CEFUROXIME SODIUM 1.5 G IV SOLR
INTRAVENOUS | Status: DC | PRN
  Administered 2016-11-28: 1.5 g via INTRAVENOUS

## 2016-11-28 MED ORDER — LISINOPRIL 2.5 MG PO TABS
2.5000 mg | ORAL_TABLET | Freq: Every day | ORAL | Status: DC
  Administered 2016-11-29 – 2016-11-30 (×2): 2.5 mg via ORAL
  Filled 2016-11-28 (×2): qty 1

## 2016-11-28 MED ORDER — HYDROMORPHONE HCL 1 MG/ML IJ SOLN: 0.5000 mg | INTRAMUSCULAR | Status: DC | PRN

## 2016-11-28 MED ORDER — PROPOFOL 10 MG/ML IV BOLUS
INTRAVENOUS | Status: DC | PRN
  Administered 2016-11-28: 140 mg via INTRAVENOUS

## 2016-11-28 MED ORDER — PHENYLEPHRINE HCL 10 MG/ML IJ SOLN
INTRAVENOUS | Status: DC | PRN
  Administered 2016-11-28: 40 ug/min via INTRAVENOUS
  Administered 2016-11-28: 20 ug/min via INTRAVENOUS

## 2016-11-28 MED ORDER — METHOCARBAMOL 500 MG PO TABS
500.0000 mg | ORAL_TABLET | Freq: Four times a day (QID) | ORAL | Status: DC | PRN
  Administered 2016-11-28 – 2016-11-29 (×2): 500 mg via ORAL
  Filled 2016-11-28 (×2): qty 1

## 2016-11-28 MED ORDER — FENTANYL CITRATE (PF) 100 MCG/2ML IJ SOLN
50.0000 ug | Freq: Once | INTRAMUSCULAR | Status: AC
  Administered 2016-11-28: 50 ug via INTRAVENOUS

## 2016-11-28 MED ORDER — CHLORHEXIDINE GLUCONATE 4 % EX LIQD: 60.0000 mL | Freq: Once | CUTANEOUS | Status: DC

## 2016-11-28 MED ORDER — MIDAZOLAM HCL 2 MG/2ML IJ SOLN
1.0000 mg | Freq: Once | INTRAMUSCULAR | Status: AC
  Administered 2016-11-28: 1 mg via INTRAVENOUS

## 2016-11-28 MED ORDER — SIMETHICONE 80 MG PO CHEW: 160.0000 mg | CHEWABLE_TABLET | Freq: Four times a day (QID) | ORAL | Status: DC | PRN

## 2016-11-28 MED ORDER — BUPIVACAINE-EPINEPHRINE (PF) 0.25% -1:200000 IJ SOLN
INTRAMUSCULAR | Status: DC | PRN
  Administered 2016-11-28: 50 mL

## 2016-11-28 MED ORDER — SODIUM CHLORIDE 0.9 % IJ SOLN
INTRAMUSCULAR | Status: AC
  Filled 2016-11-28: qty 10

## 2016-11-28 MED ORDER — LIDOCAINE HCL (CARDIAC) 20 MG/ML IV SOLN
INTRAVENOUS | Status: DC | PRN
  Administered 2016-11-28: 60 mg via INTRAVENOUS

## 2016-11-28 MED ORDER — METOCLOPRAMIDE HCL 5 MG/ML IJ SOLN: 5.0000 mg | Freq: Three times a day (TID) | INTRAMUSCULAR | Status: DC | PRN

## 2016-11-28 MED ORDER — ACETAMINOPHEN 650 MG RE SUPP: 650.0000 mg | Freq: Four times a day (QID) | RECTAL | Status: DC | PRN

## 2016-11-28 MED ORDER — ONDANSETRON HCL 4 MG PO TABS: 4.0000 mg | ORAL_TABLET | Freq: Four times a day (QID) | ORAL | Status: DC | PRN

## 2016-11-28 MED ORDER — FENTANYL CITRATE (PF) 100 MCG/2ML IJ SOLN
INTRAMUSCULAR | Status: AC
  Administered 2016-11-28: 50 ug via INTRAVENOUS
  Filled 2016-11-28: qty 2

## 2016-11-28 SURGICAL SUPPLY — 56 items
BANDAGE ESMARK 6X9 LF (GAUZE/BANDAGES/DRESSINGS) ×1 IMPLANT
BLADE SAG 18X100X1.27 (BLADE) ×3 IMPLANT
BLADE SAGITTAL 13X1.27X60 (BLADE) ×2 IMPLANT
BLADE SAGITTAL 13X1.27X60MM (BLADE) ×1
BLADE SAW SGTL 13X75X1.27 (BLADE) IMPLANT
BLADE SURG 10 STRL SS (BLADE) ×6 IMPLANT
BNDG ELASTIC 6X10 VLCR STRL LF (GAUZE/BANDAGES/DRESSINGS) ×3 IMPLANT
BNDG ESMARK 6X9 LF (GAUZE/BANDAGES/DRESSINGS) ×3
BOWL SMART MIX CTS (DISPOSABLE) ×3 IMPLANT
CAPT KNEE TOTAL 3 ATTUNE ×3 IMPLANT
CEMENT HV SMART SET (Cement) ×6 IMPLANT
COVER SURGICAL LIGHT HANDLE (MISCELLANEOUS) ×3 IMPLANT
CUFF TOURNIQUET SINGLE 34IN LL (TOURNIQUET CUFF) IMPLANT
CUFF TOURNIQUET SINGLE 44IN (TOURNIQUET CUFF) ×3 IMPLANT
DRAPE EXTREMITY T 121X128X90 (DRAPE) ×3 IMPLANT
DRAPE U-SHAPE 47X51 STRL (DRAPES) ×3 IMPLANT
DRSG AQUACEL AG ADV 3.5X10 (GAUZE/BANDAGES/DRESSINGS) IMPLANT
DURAPREP 26ML APPLICATOR (WOUND CARE) ×3 IMPLANT
ELECT REM PT RETURN 9FT ADLT (ELECTROSURGICAL) ×3
ELECTRODE REM PT RTRN 9FT ADLT (ELECTROSURGICAL) ×1 IMPLANT
GLOVE BIO SURGEON STRL SZ7.5 (GLOVE) ×3 IMPLANT
GLOVE BIO SURGEON STRL SZ8.5 (GLOVE) ×3 IMPLANT
GLOVE BIOGEL PI IND STRL 8 (GLOVE) ×1 IMPLANT
GLOVE BIOGEL PI IND STRL 9 (GLOVE) ×1 IMPLANT
GLOVE BIOGEL PI INDICATOR 8 (GLOVE) ×2
GLOVE BIOGEL PI INDICATOR 9 (GLOVE) ×2
GOWN STRL REUS W/ TWL LRG LVL3 (GOWN DISPOSABLE) ×3 IMPLANT
GOWN STRL REUS W/ TWL XL LVL3 (GOWN DISPOSABLE) ×2 IMPLANT
GOWN STRL REUS W/TWL LRG LVL3 (GOWN DISPOSABLE) ×6
GOWN STRL REUS W/TWL XL LVL3 (GOWN DISPOSABLE) ×4
HANDPIECE INTERPULSE COAX TIP (DISPOSABLE) ×2
HOOD PEEL AWAY FACE SHEILD DIS (HOOD) ×9 IMPLANT
KIT BASIN OR (CUSTOM PROCEDURE TRAY) ×3 IMPLANT
KIT ROOM TURNOVER OR (KITS) ×3 IMPLANT
MANIFOLD NEPTUNE II (INSTRUMENTS) ×6 IMPLANT
NEEDLE 22X1 1/2 (OR ONLY) (NEEDLE) ×6 IMPLANT
NS IRRIG 1000ML POUR BTL (IV SOLUTION) ×3 IMPLANT
PACK TOTAL JOINT (CUSTOM PROCEDURE TRAY) ×3 IMPLANT
PAD ARMBOARD 7.5X6 YLW CONV (MISCELLANEOUS) ×6 IMPLANT
SET HNDPC FAN SPRY TIP SCT (DISPOSABLE) ×1 IMPLANT
SUT VIC AB 0 CT1 27 (SUTURE) ×2
SUT VIC AB 0 CT1 27XBRD ANBCTR (SUTURE) ×1 IMPLANT
SUT VIC AB 1 CTX 36 (SUTURE) ×2
SUT VIC AB 1 CTX36XBRD ANBCTR (SUTURE) ×1 IMPLANT
SUT VIC AB 2-0 CT1 27 (SUTURE) ×2
SUT VIC AB 2-0 CT1 TAPERPNT 27 (SUTURE) ×1 IMPLANT
SUT VIC AB 3-0 CT1 27 (SUTURE) ×2
SUT VIC AB 3-0 CT1 TAPERPNT 27 (SUTURE) ×1 IMPLANT
SYR CONTROL 10ML LL (SYRINGE) ×6 IMPLANT
TOWEL GREEN STERILE (TOWEL DISPOSABLE) ×3 IMPLANT
TOWEL OR 17X24 6PK STRL BLUE (TOWEL DISPOSABLE) IMPLANT
TOWEL OR 17X26 10 PK STRL BLUE (TOWEL DISPOSABLE) IMPLANT
TRAY CATH 16FR W/PLASTIC CATH (SET/KITS/TRAYS/PACK) IMPLANT
TUBE CONNECTING 20'X1/4 (TUBING) ×1
TUBE CONNECTING 20X1/4 (TUBING) ×2 IMPLANT
YANKAUER SUCT BULB TIP NO VENT (SUCTIONS) ×3 IMPLANT

## 2016-11-28 NOTE — Op Note (Signed)
PATIENT ID:      Samantha Woodard  MRN:     [id number] DOB/AGE:    [birth year] / 76 y.o.       OPERATIVE REPORT    DATE OF PROCEDURE:  11/28/2016       PREOPERATIVE DIAGNOSIS:   LEFT KNEE OSTEOARTHRITIS      Estimated body mass index is 36.33 kg/m as calculated from the following:   Height as of this encounter: 5\' 9"  (1.753 m).   Weight as of this encounter: 111.6 kg (246 lb).                                                        POSTOPERATIVE DIAGNOSIS:   LEFT KNEE OSTEOARTHRITIS                                                                      PROCEDURE:  Procedure(s): TOTAL KNEE ARTHROPLASTY Using DepuyAttune RP implants #5L Femur, #6Tibia, 38 mm Attune RP bearing, 38 Patella     SURGEON: Chiquita Heckert J    ASSISTANT:   Eric K[employer]   (Present and scrubbed throughout the case, critical for assistance with exposure, retraction, instrumentation, and closure.)         ANESTHESIA: GET, 20cc Exparel, 50cc 0.25% Marcaine  EBL: 300  FLUID REPLACEMENT: 1500 crystalloid  TOURNIQUET TIME: 55min  Drains: None  Tranexamic Acid: 1gm IV, 2gm topical  COMPLICATIONS:  None         INDICATIONS FOR PROCEDURE: The patient has  LEFT KNEE OSTEOARTHRITIS, Val deformities, XR shows bone on bone arthritis, lateral subluxation of tibia. Patient has failed all conservative measures including anti-inflammatory medicines, narcotics, attempts at  exercise and weight loss, cortisone injections and viscosupplementation.  Risks and benefits of surgery have been discussed, questions answered.   DESCRIPTION OF PROCEDURE: The patient identified by armband, received  IV antibiotics, in the holding area at [facility]. Patient taken to the operating room, appropriate anesthetic  monitors were attached, and GET anesthesia was  induced. Tourniquet  applied high to the operative thigh. Lateral post and foot positioner  applied to the table, the lower extremity was then prepped and draped  in usual  sterile fashion from the toes to the tourniquet. Time-out procedure was performed. We began the operation, with the knee flexed 120 degrees, by making the anterior midline incision starting at handbreadth above the patella going over the patella 1 cm medial to and 4 cm distal to the tibial tubercle. Small bleeders in the skin and the  subcutaneous tissue identified and cauterized. Transverse retinaculum was incised and reflected medially and a medial parapatellar arthrotomy was accomplished. the patella was everted and theprepatellar fat pad resected. The superficial medial collateral  ligament was then elevated from anterior to posterior along the proximal  flare of the tibia and anterior half of the menisci resected. The knee was hyperflexed exposing bone on bone arthritis. Peripheral and notch osteophytes as well as the cruciate ligaments were then resected. We continued to  work our way around posteriorly along the proximal tibia, and  externally  rotated the tibia subluxing it out from underneath the femur. A McHale  retractor was placed through the notch and a lateral Hohmann retractor  placed, and we then drilled through the proximal tibia in line with the  axis of the tibia followed by an intramedullary guide rod and 2-degree  posterior slope cutting guide. The tibial cutting guide, 3 degree posterior sloped, was pinned into place allowing resection of 8 mm of bone medially and 0 mm of bone laterally. Satisfied with the tibial resection, we then  entered the distal femur 2 mm anterior to the PCL origin with the  intramedullary guide rod and applied the distal femoral cutting guide  set at 9 mm, with 5 degrees of valgus. This was pinned along the  epicondylar axis. At this point, the distal femoral cut was accomplished without difficulty. We then sized for a #5L femoral component and pinned the guide in 0 degrees of external rotation. The chamfer cutting guide was pinned into place. The anterior,  posterior, and chamfer cuts were accomplished without difficulty followed by  the Attune RP box cutting guide and the box cut. We also removed posterior osteophytes from the posterior femoral condyles. At this  time, the knee was brought into full extension. We checked our  extension and flexion gaps and found them symmetric for a 5 mm bearing. Distracting in extension with a lamina spreader, the posterior horns of the menisci were removed, and Exparel, diluted to 60 cc, with 20cc NS, and 20cc 0.5% Marcaine,was injected into the capsule and synovium of the knee. The posterior patella cut was accomplished with the 9.5 mm Attune cutting guide, sized for a 15mm dome, and the fixation pegs drilled.The knee  was then once again hyperflexed exposing the proximal tibia. We sized for a # 6 tibial base plate, applied the smokestack and the conical reamer followed by the the Delta fin keel punch. We then hammered into place the Attune RP trial femoral component, drilled the lugs, inserted a  5 mm trial bearing, trial patellar button, and took the knee through range of motion from 0-130 degrees. No thumb pressure was required for patellar Tracking. At this point, the limb was wrapped with an Esmarch bandage and the tourniquet inflated to 350 mmHg. All trial components were removed, mating surfaces irrigated with pulse lavage, and dried with suction and sponges. 10 cc of the Exparel solution was applied to the cancellus bone of the patella distal femur and proximal tibia.  After waiting 1 minute, the bony surfaces were again, dried with sponges. A double batch of DePuy HV cement with 1500 mg of Zinacef was mixed and applied to all bony metallic mating surfaces except for the posterior condyles of the femur itself. In order, we hammered into place the tibial tray and removed excess cement, the femoral component and removed excess cement. The final Attune RP bearing  was inserted, and the knee brought to full extension with  compression.  The patellar button was clamped into place, and excess cement  removed. While the cement cured the wound was irrigated out with normal saline solution pulse lavage. Ligament stability and patellar tracking were checked and found to be excellent. The parapatellar arthrotomy was closed with  running #1 Vicryl suture. The subcutaneous tissue with 0 and 2-0 undyed  Vicryl suture, and the skin with running 3-0 SQ vicryl. A dressing of Xeroform,  4 x 4, dressing sponges, Webril, and Ace wrap applied. The patient  awakened, and taken to  recovery room without difficulty.   Latunya Kissick J 11/28/2016, 10:31 AM

## 2016-11-28 NOTE — Evaluation (Signed)
Physical Therapy Evaluation Patient Details Name: Samantha Woodard MRN: [id number] DOB: August 14, 1940 Today's Date: 11/28/2016   History of Present Illness  Pt is a 76 y/o female s/p elective L TKA. PMH includes a fib, anxiety, depression, cerebrovascular diseases, HTN, MVP, OSA, PVD, peripheral neuropathy, CVA, spina bifida occulta.   Clinical Impression  Pt is s/p surgery above with deficits below. PTA, pt was using rollator for primary mobility. Upon evaluation, pt limited by post op pain and weakness, as well as, decreased balance. Pt requiring min guard to min A for safety during mobility. Pt reports husband can assist as needed upon d/c home. Pt will need DME below and reports she will be getting HHPT at d/c. Will continue to follow acutely to maximize functional mobility independence.     Follow Up Recommendations DC plan and follow up therapy as arranged by surgeon;Supervision/Assistance - 24 hour    Equipment Recommendations  Rolling walker with 5" wheels;3in1 (PT)    Recommendations for Other Services       Precautions / Restrictions Precautions Precautions: Knee Precaution Booklet Issued: Yes (comment) Precaution Comments: Reviewed supine ther ex with pt.  Restrictions Weight Bearing Restrictions: Yes LLE Weight Bearing: Weight bearing as tolerated      Mobility  Bed Mobility Overal bed mobility: Needs Assistance Bed Mobility: Sit to Supine       Sit to supine: Min guard   General bed mobility comments: Min guard for controlled descent. Pt asking questions about best way to enter/exit bed. Reviewed technique with pt. Pt in chair upon entry.   Transfers Overall transfer level: Needs assistance Equipment used: Rolling walker (2 wheeled) Transfers: Sit to/from Stand Sit to Stand: Min assist         General transfer comment: Min A for steadying upon standing. Verbal cues for hand placement.   Ambulation/Gait Ambulation/Gait assistance: Min guard Ambulation  Distance (Feet): 50 Feet Assistive device: Rolling walker (2 wheeled) Gait Pattern/deviations: Step-to pattern;Decreased step length - left;Decreased weight shift to left;Antalgic Gait velocity: Decreased Gait velocity interpretation: Below normal speed for age/gender General Gait Details: Slow, antalgic gait. Required standing rest breaks secondary to pain and fatigue. Verbal cues for upright posture throughout and appropriate sequencing with RW. Min guard for safety.   Stairs            Wheelchair Mobility    Modified Rankin (Stroke Patients Only)       Balance Overall balance assessment: Needs assistance Sitting-balance support: No upper extremity supported;Feet supported Sitting balance-Leahy Scale: Good     Standing balance support: Bilateral upper extremity supported;During functional activity Standing balance-Leahy Scale: Poor Standing balance comment: Reliant on RW for stability                              Pertinent Vitals/Pain Pain Assessment: Faces Faces Pain Scale: Hurts even more Pain Location: L knee Pain Descriptors / Indicators: Aching;Grimacing;Operative site guarding Pain Intervention(s): Limited activity within patient's tolerance;Monitored during session;Repositioned    Home Living Family/patient expects to be discharged to:: Private residence Living Arrangements: Spouse/significant other Available Help at Discharge: Family;Available 24 hours/day Type of Home: Other(Comment) (town home ) Home Access: Stairs to enter Entrance Stairs-Rails: None Technical brewer of Steps: 1 (threshold step ) Home Layout: One level Home Equipment: Environmental consultant - 4 wheels;Cane - single point      Prior Function Level of Independence: Independent with assistive device(s)         Comments: Used  rollator at baseline      Hand Dominance        Extremity/Trunk Assessment   Upper Extremity Assessment Upper Extremity Assessment: Defer to OT  evaluation    Lower Extremity Assessment Lower Extremity Assessment: LLE deficits/detail LLE Deficits / Details: Deficits consistent with post op pain and weakness. Able to perform ther ex below.     Cervical / Trunk Assessment Cervical / Trunk Assessment: Kyphotic  Communication   Communication: No difficulties  Cognition Arousal/Alertness: Awake/alert Behavior During Therapy: WFL for tasks assessed/performed Overall Cognitive Status: Within Functional Limits for tasks assessed                                        General Comments General comments (skin integrity, edema, etc.): Pt used BSC during session; notified RN.     Exercises Total Joint Exercises Ankle Circles/Pumps: AROM;Both;10 reps;Supine Quad Sets: AROM;Left;10 reps;Supine Towel Squeeze: AROM;Both;10 reps;Supine Short Arc Quad: AROM;Left;10 reps;Supine   Assessment/Plan    PT Assessment Patient needs continued PT services  PT Problem List Decreased strength;Decreased activity tolerance;Decreased range of motion;Decreased balance;Decreased mobility;Decreased knowledge of use of DME;Pain       PT Treatment Interventions DME instruction;Gait training;Stair training;Therapeutic activities;Functional mobility training;Therapeutic exercise;Balance training;Neuromuscular re-education;Patient/family education    PT Goals (Current goals can be found in the Care Plan section)  Acute Rehab PT Goals Patient Stated Goal: to get stronger PT Goal Formulation: With patient Time For Goal Achievement: 12/05/16 Potential to Achieve Goals: Good    Frequency 7X/week   Barriers to discharge        Co-evaluation               AM-PAC PT "6 Clicks" Daily Activity  Outcome Measure Difficulty turning over in bed (including adjusting bedclothes, sheets and blankets)?: A Little Difficulty moving from lying on back to sitting on the side of the bed? : A Little Difficulty sitting down on and standing up  from a chair with arms (e.g., wheelchair, bedside commode, etc,.)?: Total Help needed moving to and from a bed to chair (including a wheelchair)?: A Little Help needed walking in hospital room?: A Little Help needed climbing 3-5 steps with a railing? : A Lot 6 Click Score: 15    End of Session Equipment Utilized During Treatment: Gait belt Activity Tolerance: Patient tolerated treatment well Patient left: in bed;with call bell/phone within reach Nurse Communication: Mobility status PT Visit Diagnosis: Other abnormalities of gait and mobility (R26.89);Pain Pain - Right/Left: Left Pain - part of body: Knee    Time: 3154-0086 PT Time Calculation (min) (ACUTE ONLY): 45 min   Charges:   PT Evaluation $PT Eval Low Complexity: 1 Procedure PT Treatments $Gait Training: 8-22 mins   PT G Codes:        Leighton Ruff, PT, DPT  Acute Rehabilitation Services  Pager: [phone]   Rudean Hitt 11/28/2016, 6:28 PM

## 2016-11-28 NOTE — Discharge Instructions (Signed)

## 2016-11-28 NOTE — Anesthesia Procedure Notes (Signed)
Procedure Name: LMA Insertion Date/Time: 11/28/2016 9:05 AM Performed by: Carney Living Pre-anesthesia Checklist: Patient identified, Emergency Drugs available, Suction available, Patient being monitored and Timeout performed Patient Re-evaluated:Patient Re-evaluated prior to inductionOxygen Delivery Method: Circle system utilized Preoxygenation: Pre-oxygenation with 100% oxygen Intubation Type: IV induction Ventilation: Mask ventilation without difficulty LMA Size: 4.0 Number of attempts: 1 Placement Confirmation: positive ETCO2 and breath sounds checked- equal and bilateral Tube secured with: Tape Dental Injury: Teeth and Oropharynx as per pre-operative assessment

## 2016-11-28 NOTE — Transfer of Care (Signed)
Immediate Anesthesia Transfer of Care Note  Patient: Samantha Woodard  Procedure(s) Performed: Procedure(s): TOTAL KNEE ARTHROPLASTY (Left)  Patient Location: PACU  Anesthesia Type:General  Level of Consciousness: awake and patient cooperative  Airway & Oxygen Therapy: Patient Spontanous Breathing and Patient connected to nasal cannula oxygen  Post-op Assessment: Report given to RN, Post -op Vital signs reviewed and stable and Patient moving all extremities X 4  Post vital signs: Reviewed and stable  Last Vitals:  Vitals:   11/28/16 0811 11/28/16 1128  BP: (!) 161/72   Pulse: 74   Resp: 18   Temp: 36.7 C (P) 36.2 C    Last Pain:  Vitals:   11/28/16 0811  TempSrc: Oral  PainSc: 0-No pain      Patients Stated Pain Goal: 2 (14/78/29 5621)  Complications: No apparent anesthesia complications

## 2016-11-28 NOTE — Anesthesia Procedure Notes (Signed)
Anesthesia Regional Block: Adductor canal block   Pre-Anesthetic Checklist: ,, timeout performed, Correct Patient, Correct Site, Correct Laterality, Correct Procedure, Correct Position, site marked, Risks and benefits discussed, pre-op evaluation,  At surgeon's request and post-op pain management  Laterality: Left  Prep: Maximum Sterile Barrier Precautions used, chloraprep       Needles:  Injection technique: Single-shot  Needle Type: Echogenic Stimulator Needle     Needle Length: 9cm  Needle Gauge: 21     Additional Needles:   Procedures: ultrasound guided,,,,,,,,  Narrative:  Start time: 11/28/2016 8:28 AM End time: 11/28/2016 8:38 AM Injection made incrementally with aspirations every 5 mL. Anesthesiologist: Roderic Palau  Additional Notes: 2% Lidocaine skin wheel.

## 2016-11-28 NOTE — Interval H&P Note (Signed)
History and Physical Interval Note:  11/28/2016 8:50 AM  Samantha Woodard  has presented today for surgery, with the diagnosis of LEFT KNEE OSTEOARTHRITIS  The various methods of treatment have been discussed with the patient and family. After consideration of risks, benefits and other options for treatment, the patient has consented to  Procedure(s): TOTAL KNEE ARTHROPLASTY (Left) as a surgical intervention .  The patient's history has been reviewed, patient examined, no change in status, stable for surgery.  I have reviewed the patient's chart and labs.  Questions were answered to the patient's satisfaction.     Kerin Salen

## 2016-11-28 NOTE — Anesthesia Postprocedure Evaluation (Signed)
Anesthesia Post Note  Patient: Samantha Woodard  Procedure(s) Performed: Procedure(s) (LRB): TOTAL KNEE ARTHROPLASTY (Left)     Patient location during evaluation: PACU Anesthesia Type: General and Regional Level of consciousness: awake and alert Pain management: pain level controlled Vital Signs Assessment: post-procedure vital signs reviewed and stable Respiratory status: spontaneous breathing, nonlabored ventilation, respiratory function stable and patient connected to nasal cannula oxygen Cardiovascular status: blood pressure returned to baseline and stable Postop Assessment: no signs of nausea or vomiting Anesthetic complications: no    Last Vitals:  Vitals:   11/28/16 1258 11/28/16 1300  BP: 129/69   Pulse: 66 65  Resp: 15 12  Temp:  36.1 C    Last Pain:  Vitals:   11/28/16 1200  TempSrc:   PainSc: 6                  Karrah Mangini,W. EDMOND

## 2016-11-29 ENCOUNTER — Encounter (HOSPITAL_COMMUNITY): Payer: Self-pay | Admitting: Orthopedic Surgery

## 2016-11-29 LAB — CBC
HEMATOCRIT: 31.5 % — AB (ref 36.0–46.0)
Hemoglobin: 9.9 g/dL — ABNORMAL LOW (ref 12.0–15.0)
MCH: 28.4 pg (ref 26.0–34.0)
MCHC: 31.4 g/dL (ref 30.0–36.0)
MCV: 90.5 fL (ref 78.0–100.0)
PLATELETS: 288 10*3/uL (ref 150–400)
RBC: 3.48 MIL/uL — ABNORMAL LOW (ref 3.87–5.11)
RDW: 14.2 % (ref 11.5–15.5)
WBC: 10.2 10*3/uL (ref 4.0–10.5)

## 2016-11-29 LAB — BASIC METABOLIC PANEL
ANION GAP: 6 (ref 5–15)
BUN: 16 mg/dL (ref 6–20)
CALCIUM: 8.5 mg/dL — AB (ref 8.9–10.3)
CO2: 27 mmol/L (ref 22–32)
Chloride: 101 mmol/L (ref 101–111)
Creatinine, Ser: 1.01 mg/dL — ABNORMAL HIGH (ref 0.44–1.00)
GFR, EST NON AFRICAN AMERICAN: 52 mL/min — AB (ref >60)
Glucose, Bld: 126 mg/dL — ABNORMAL HIGH (ref 65–99)
Potassium: 3.9 mmol/L (ref 3.5–5.1)
SODIUM: 134 mmol/L — AB (ref 135–145)

## 2016-11-29 NOTE — Progress Notes (Signed)
Physical Therapy Treatment Patient Details Name: Samantha Woodard MRN: [id number] DOB: 1940/08/24 Today's Date: 11/29/2016    History of Present Illness Pt is a 76 y/o female s/p elective L TKA. PMH includes a fib, anxiety, depression, cerebrovascular diseases, HTN, MVP, OSA, PVD, peripheral neuropathy, CVA, spina bifida occulta.     PT Comments    Pt very pleasant, talkative and eager to return to walking. Pt educated for HEP, transfers, gait and progression with heel elevated end of session. Will continue to follow.    Follow Up Recommendations  DC plan and follow up therapy as arranged by surgeon;Supervision/Assistance - 24 hour     Equipment Recommendations  Rolling walker with 5" wheels;3in1 (PT)    Recommendations for Other Services       Precautions / Restrictions Precautions Precautions: Knee;Fall Restrictions LLE Weight Bearing: Weight bearing as tolerated    Mobility  Bed Mobility Overal bed mobility: Modified Independent Bed Mobility: Supine to Sit     Supine to sit: Modified independent (Device/Increase time)     General bed mobility comments: HOB 20 degrees with rail   Transfers Overall transfer level: Needs assistance   Transfers: Sit to/from Stand Sit to Stand: Min guard         General transfer comment: cues for hand placement and sequence, pt utilizing momentum from bed to stand  Ambulation/Gait Ambulation/Gait assistance: Min guard Ambulation Distance (Feet): 300 Feet Assistive device: Rolling walker (2 wheeled) Gait Pattern/deviations: Step-through pattern;Decreased stride length   Gait velocity interpretation: Below normal speed for age/gender General Gait Details: pt with great progression to step through pattern and able to perform heel strike bil, cues for position and posture with RW   Stairs Stairs: Yes   Stair Management: Step to pattern;Backwards;With walker Number of Stairs: 1 General stair comments: x2 trials with cues for  sequence  Wheelchair Mobility    Modified Rankin (Stroke Patients Only)       Balance Overall balance assessment: Needs assistance   Sitting balance-Leahy Scale: Good       Standing balance-Leahy Scale: Fair                              Cognition Arousal/Alertness: Awake/alert Behavior During Therapy: WFL for tasks assessed/performed Overall Cognitive Status: Within Functional Limits for tasks assessed                                        Exercises Total Joint Exercises Heel Slides: AROM;Left;Supine;10 reps Hip ABduction/ADduction: AROM;Left;Supine;10 reps Straight Leg Raises: AROM;Left;Supine;10 reps Long Arc Quad: AROM;Left;Seated;10 reps    General Comments        Pertinent Vitals/Pain Pain Assessment: No/denies pain    Home Living                      Prior Function            PT Goals (current goals can now be found in the care plan section) Progress towards PT goals: Progressing toward goals    Frequency           PT Plan Current plan remains appropriate    Co-evaluation              AM-PAC PT "6 Clicks" Daily Activity  Outcome Measure  Difficulty turning over in bed (including adjusting bedclothes, sheets and blankets)?: A  Little Difficulty moving from lying on back to sitting on the side of the bed? : Total Difficulty sitting down on and standing up from a chair with arms (e.g., wheelchair, bedside commode, etc,.)?: A Little Help needed moving to and from a bed to chair (including a wheelchair)?: A Little Help needed walking in hospital room?: A Little Help needed climbing 3-5 steps with a railing? : A Lot 6 Click Score: 15    End of Session Equipment Utilized During Treatment: Gait belt Activity Tolerance: Patient tolerated treatment well Patient left: in chair;with call bell/phone within reach Nurse Communication: Mobility status PT Visit Diagnosis: Other abnormalities of gait and  mobility (R26.89)     Time: 5189-8421 PT Time Calculation (min) (ACUTE ONLY): 44 min  Charges:  $Gait Training: 8-22 mins $Therapeutic Exercise: 8-22 mins $Therapeutic Activity: 8-22 mins                    G Codes:       Elwyn Reach, PT [phone]    [place] 11/29/2016, 9:39 AM

## 2016-11-29 NOTE — Progress Notes (Signed)
Physical Therapy Treatment Patient Details Name: Samantha Woodard MRN: [id number] DOB: November 21, 1940 Today's Date: 11/29/2016    History of Present Illness Pt is a 76 y/o female s/p elective L TKA. PMH includes a fib, anxiety, depression, cerebrovascular diseases, HTN, MVP, OSA, PVD, peripheral neuropathy, CVA, spina bifida occulta.     PT Comments    Pt seated in recliner upon arrival and eager to ambulate. Pt required some assistance for sit to stand transfer from chair for momentum. Pt with good sequencing with ambulation, requiring min VCs for direction and posturing using RW. Pt denies pain during session. Pt would benefit from continued acute therapy for LE strengthening, mobilization, and DME instruction for safe d/c.    Follow Up Recommendations  DC plan and follow up therapy as arranged by surgeon;Supervision/Assistance - 24 hour     Equipment Recommendations       Recommendations for Other Services       Precautions / Restrictions Precautions Precautions: Fall;Knee Restrictions Weight Bearing Restrictions: Yes LLE Weight Bearing: Weight bearing as tolerated    Mobility  Bed Mobility                  Transfers Overall transfer level: Needs assistance   Transfers: Sit to/from Stand Sit to Stand: Min assist         General transfer comment: Min A for lifting from chair   Ambulation/Gait Ambulation/Gait assistance: Min guard Ambulation Distance (Feet): 350 Feet Assistive device: Rolling walker (2 wheeled) Gait Pattern/deviations: Step-through pattern;Decreased stride length     General Gait Details: Pt demonstrates step through pattern and able to perform heel strike B, VCs for position and posture with RW   Stairs            Wheelchair Mobility    Modified Rankin (Stroke Patients Only)       Balance Overall balance assessment: Needs assistance         Standing balance support: Bilateral upper extremity supported;During functional  activity Standing balance-Leahy Scale: Fair                              Cognition Arousal/Alertness: Awake/alert Behavior During Therapy: WFL for tasks assessed/performed Overall Cognitive Status: Within Functional Limits for tasks assessed                                        Exercises Total Joint Exercises Heel Slides: AROM;Left;10 reps;Seated Hip ABduction/ADduction: AROM;Left;Seated;10 reps Straight Leg Raises: AROM;Left;Seated;10 reps Long Arc Quad: AROM;Left;10 reps;Seated    General Comments        Pertinent Vitals/Pain Pain Assessment: No/denies pain    Home Living                      Prior Function            PT Goals (current goals can now be found in the care plan section) Acute Rehab PT Goals Patient Stated Goal: To go home  PT Goal Formulation: With patient Time For Goal Achievement: 12/13/16 Potential to Achieve Goals: Good Progress towards PT goals: Progressing toward goals    Frequency    7X/week      PT Plan Current plan remains appropriate    Co-evaluation              AM-PAC PT "6 Clicks" Daily Activity  Outcome Measure  Difficulty turning over in bed (including adjusting bedclothes, sheets and blankets)?: A Little Difficulty moving from lying on back to sitting on the side of the bed? : Total Difficulty sitting down on and standing up from a chair with arms (e.g., wheelchair, bedside commode, etc,.)?: A Little Help needed moving to and from a bed to chair (including a wheelchair)?: A Little Help needed walking in hospital room?: A Little Help needed climbing 3-5 steps with a railing? : A Lot 6 Click Score: 15    End of Session Equipment Utilized During Treatment: Gait belt Activity Tolerance: Patient tolerated treatment well Patient left: in chair;with call bell/phone within reach;with family/visitor present   PT Visit Diagnosis: Other abnormalities of gait and mobility (R26.89) Pain  - Right/Left: Left Pain - part of body: Knee     Time: 1325-1356 PT Time Calculation (min) (ACUTE ONLY): 31 min  Charges:  $Gait Training: 8-22 mins $Therapeutic Exercise: 8-22 mins                    G Codes:       Elberta Leatherwood, SPT Acute Rehab [place] 11/29/2016, 2:13 PM

## 2016-11-29 NOTE — Progress Notes (Signed)
PATIENT ID: Samantha Woodard  MRN: [id number]  DOB/AGE:  [birth year] / 76 y.o.  1 Day Post-Op Procedure(s) (LRB): TOTAL KNEE ARTHROPLASTY (Left)    PROGRESS NOTE Subjective: Patient is alert, oriented, no Nausea, no Vomiting, yes passing gas. Taking PO well. Denies SOB, Chest or Calf Pain. Using Incentive Spirometer, PAS in place. Ambulate 60', Patient reports pain as 2/10 .    Objective: Vital signs in last 24 hours: Vitals:   11/28/16 1530 11/28/16 1642 11/28/16 2300 11/29/16 0500  BP:  (!) 122/95 115/64 (!) 122/51  Pulse: 66 68 70 70  Resp: 13  16 16   Temp: 97.5 F (36.4 C) 97.6 F (36.4 C) 98 F (36.7 C) 98 F (36.7 C)  TempSrc:  Oral Oral Oral  SpO2: 98% 98% 96% 99%  Weight:      Height:          Intake/Output from previous day: I/O last 3 completed shifts: In: 1600 [I.V.:1500; IV Piggyback:100] Out: 1050 [Urine:1050]   Intake/Output this shift: No intake/output data recorded.   LABORATORY DATA:  Recent Labs  11/29/16 0538  WBC 10.2  HGB 9.9*  HCT 31.5*  PLT 288  NA 134*  K 3.9  CL 101  CO2 27  BUN 16  CREATININE 1.01*  GLUCOSE 126*  CALCIUM 8.5*    Examination: Neurologically intact ABD soft Neurovascular intact Sensation intact distally Intact pulses distally Dorsiflexion/Plantar flexion intact Incision: dressing C/D/I No cellulitis present Compartment soft}  Assessment:   1 Day Post-Op Procedure(s) (LRB): TOTAL KNEE ARTHROPLASTY (Left) ADDITIONAL DIAGNOSIS: Expected Acute Blood Loss Anemia, CVD, neuropathy  Plan: PT/OT WBAT, AROM and PROM  DVT Prophylaxis:  SCDx72hrs, ASA 325 mg BID x 2 weeks DISCHARGE PLAN: Home DISCHARGE NEEDS: HHPT, Walker and 3-in-1 comode seat     Samantha Woodard J 11/29/2016, 7:23 AM Patient ID: Samantha Woodard, female   DOB: 1940-07-11, 76 y.o.   MRN: [id number]

## 2016-11-30 LAB — CBC
HEMATOCRIT: 30.3 % — AB (ref 36.0–46.0)
HEMOGLOBIN: 9.5 g/dL — AB (ref 12.0–15.0)
MCH: 28.8 pg (ref 26.0–34.0)
MCHC: 31.4 g/dL (ref 30.0–36.0)
MCV: 91.8 fL (ref 78.0–100.0)
Platelets: 281 10*3/uL (ref 150–400)
RBC: 3.3 MIL/uL — ABNORMAL LOW (ref 3.87–5.11)
RDW: 14.6 % (ref 11.5–15.5)
WBC: 9 10*3/uL (ref 4.0–10.5)

## 2016-11-30 NOTE — Discharge Summary (Signed)
Patient ID: Samantha Woodard MRN: [id number] DOB/AGE: [birth year] 76 y.o.  Admit date: 11/28/2016 Discharge date: 11/30/2016  Admission Diagnoses:  Principal Problem:   Primary osteoarthritis of left knee   Discharge Diagnoses:  Same  Past Medical History:  Diagnosis Date  . Anemia   . Anxiety and depression   . Arthritis, degenerative    scoliosis  . Atrial fibrillation ([place])    history of- pt. reports thats why she is on Plavix. But the afib is resolved.    . Cerebrovascular disease   . Chronic kidney disease   . Chronic low back pain   . Complication of anesthesia 1970's   post op- vertigo, odd sensation with " feeling like I was going to fall", also reports that the anesth. team remarked that she had a quick" response to anesth.    . Diverticulosis   . Dyslipidemia   . Fibromyalgia   . GERD (gastroesophageal reflux disease)   . HTN (hypertension)   . IBS (irritable bowel syndrome)   . Mild mitral valve prolapse   . Obesity   . OSA (obstructive sleep apnea)    tried CPAP, then Bipap, now on concentrator  . Peripheral neuropathy    feet  . Peripheral vascular disease ([place])   . Positional vertigo   . Scoliosis    lumbar 5  . Spina bifida occulta   . Stroke [facility])    pt. reports that she had TIA's in the past & has been on Plavix ever since.   . Varicose vein     Surgeries: Procedure(s): TOTAL KNEE ARTHROPLASTY on 11/28/2016   Consultants:   Discharged Condition: Improved  Hospital Course: Samantha Woodard is an 76 y.o. female who was admitted 11/28/2016 for operative treatment ofPrimary osteoarthritis of left knee. Patient has severe unremitting pain that affects sleep, daily activities, and work/hobbies. After pre-op clearance the patient was taken to the operating room on 11/28/2016 and underwent  Procedure(s): TOTAL KNEE ARTHROPLASTY.    Patient was given perioperative antibiotics: Anti-infectives    Start     Dose/Rate Route Frequency Ordered Stop   11/28/16  0950  cefUROXime (ZINACEF) injection  Status:  Discontinued       As needed 11/28/16 0951 11/28/16 1123   11/28/16 0830  ceFAZolin (ANCEF) IVPB 2g/100 mL premix     2 g 200 mL/hr over 30 Minutes Intravenous To [facility] 11/25/16 0846 11/28/16 0906       Patient was given sequential compression devices, early ambulation, and chemoprophylaxis to prevent DVT.  Patient benefited maximally from hospital stay and there were no complications.    Recent vital signs: Patient Vitals for the past 24 hrs:  BP Temp Temp src Pulse Resp SpO2  11/30/16 0503 112/69 97.7 F (36.5 C) Oral 75 16 99 %  11/29/16 2104 128/60 97.6 F (36.4 C) Oral 75 16 100 %  11/29/16 1143 126/64 - - 68 - -     Recent laboratory studies:  Recent Labs  11/29/16 0538 11/30/16 0450  WBC 10.2 9.0  HGB 9.9* 9.5*  HCT 31.5* 30.3*  PLT 288 281  NA 134*  --   K 3.9  --   CL 101  --   CO2 27  --   BUN 16  --   CREATININE 1.01*  --   GLUCOSE 126*  --   CALCIUM 8.5*  --      Discharge Medications:   Allergies as of 11/30/2016      Reactions   Iodine  Hives   IV CONTRAST   Neomycin Hives, Rash   Tape Hives, Rash      Medication List    STOP taking these medications   acetaminophen 500 MG tablet Commonly known as:  TYLENOL     TAKE these medications   ALIGN 4 MG Caps Take 4 mg by mouth daily with lunch.   aspirin EC 81 MG tablet Take 81 mg by mouth daily with supper.   BENEFIBER Powd Take 14.2 g by mouth 2 (two) times daily.   carisoprodol 350 MG tablet Commonly known as:  SOMA Take 175-350 mg by mouth 2 (two) times daily as needed for muscle spasms.   celecoxib 200 MG capsule Commonly known as:  CELEBREX Take 200 mg by mouth daily with lunch.   cetirizine 10 MG tablet Commonly known as:  ZYRTEC Take 10 mg by mouth at bedtime.   clopidogrel 75 MG tablet Commonly known as:  PLAVIX Take 0.5 tablets (37.5 mg total) by mouth daily. What changed:  when to take this   [id number]-60  EX Apply 1 application topically 2 (two) times daily as needed (for insect bites/itchy skin).   fluticasone 50 MCG/ACT nasal spray Commonly known as:  FLONASE Place 2 sprays into both nostrils at bedtime.   gabapentin 600 MG tablet Commonly known as:  NEURONTIN Take 1 tablet (600 mg total) by mouth 3 (three) times daily.   hydrochlorothiazide 12.5 MG tablet Commonly known as:  HYDRODIURIL Take 12.5 mg by mouth daily with lunch.   lisinopril 5 MG tablet Commonly known as:  PRINIVIL,ZESTRIL Take 2.5 mg by mouth daily with lunch.   multivitamin with minerals Tabs tablet Take 1 tablet by mouth daily with supper. WOMEN'S 50+   oxyCODONE-acetaminophen 5-325 MG tablet Commonly known as:  ROXICET Take 1-2 tablets by mouth every 4 (four) hours as needed.   pantoprazole 40 MG tablet Commonly known as:  PROTONIX Take 40 mg by mouth daily before breakfast. 30 MINS TO 1 HOUR BEFORE EATING.   PHAZYME 180 MG Caps Generic drug:  Simethicone Take 180 mg by mouth 2 (two) times daily as needed (FOR BLOATING/GAS).   simvastatin 20 MG tablet Commonly known as:  ZOCOR Take 20 mg by mouth every evening.   tetrahydrozoline-zinc 0.05-0.25 % ophthalmic solution Commonly known as:  VISINE-AC Place 2 drops into both eyes 3 (three) times daily as needed (for redness/itchy eyes.).            Durable Medical Equipment        Start     Ordered   11/28/16 1557  DME Walker rolling  Once    Question:  Patient needs a walker to treat with the following condition  Answer:  Status post total left knee replacement   11/28/16 1556   11/28/16 1557  DME 3 n 1  Once     11/28/16 1556   11/28/16 1557  DME Bedside commode  Once    Question:  Patient needs a bedside commode to treat with the following condition  Answer:  Status post total left knee replacement   11/28/16 1556      Diagnostic Studies: Dg Chest 2 View  Result Date: 11/16/2016 CLINICAL DATA:  Preoperative examination prior to left  total knee arthroplasty EXAM: CHEST  2 VIEW COMPARISON:  None in PACs FINDINGS: The right lung is adequately inflated. There is no focal infiltrate. There are coarse lung markings in the lingula. The heart and pulmonary vascularity are normal. The mediastinum is normal  in width. There is no pleural effusion. The bony thorax exhibits no acute abnormality. IMPRESSION: There is no acute cardiopulmonary abnormality. Electronically Signed   By: David  Martinique M.D.   On: 11/16/2016 14:26    Disposition: Final discharge disposition not confirmed  Discharge Instructions    Call MD / Call 911    Complete by:  As directed    If you experience chest pain or shortness of breath, CALL 911 and be transported to the hospital emergency room.  If you develope a fever above 101 F, pus (white drainage) or increased drainage or redness at the wound, or calf pain, call your surgeon's office.   Constipation Prevention    Complete by:  As directed    Drink plenty of fluids.  Prune juice may be helpful.  You may use a stool softener, such as Colace (over the counter) 100 mg twice a day.  Use MiraLax (over the counter) for constipation as needed.   Diet - low sodium heart healthy    Complete by:  As directed    Driving restrictions    Complete by:  As directed    No driving for 2 weeks   Increase activity slowly as tolerated    Complete by:  As directed    Patient may shower    Complete by:  As directed    You may shower without a dressing once there is no drainage.  Do not wash over the wound.  If drainage remains, cover wound with plastic wrap and then shower.      Follow-up Information    Frederik Pear, MD Follow up in 2 week(s).   Specialty:  Orthopedic Surgery Contact information: [place] [address] [phone]            Signed: Hardin Negus Rolondo Pierre R 11/30/2016, 8:12 AM

## 2016-11-30 NOTE — Progress Notes (Signed)
Physical Therapy Treatment Patient Details Name: Samantha Woodard MRN: [id number] DOB: 04-22-1941 Today's Date: 11/30/2016    History of Present Illness Pt is a 76 y/o female s/p elective L TKA. PMH includes a fib, anxiety, depression, cerebrovascular diseases, HTN, MVP, OSA, PVD, peripheral neuropathy, CVA, spina bifida occulta.     PT Comments    Pt pleasant and eager to get OOB to ambulate with PT. Pt did not require VCs for positioning for transfers; however needed min VCs for posturing and direction while using RW. Pt ambulating great distances and performing transfers safely; safe for return home once medically cleared. Pt will continue to benefit from acute therapy for LE strengthening and mobilization.    Follow Up Recommendations  DC plan and follow up therapy as arranged by surgeon;Supervision/Assistance - 24 hour     Equipment Recommendations       Recommendations for Other Services       Precautions / Restrictions Precautions Precautions: Fall;Knee Restrictions Weight Bearing Restrictions: Yes LLE Weight Bearing: Weight bearing as tolerated    Mobility  Bed Mobility Overal bed mobility: Modified Independent Bed Mobility: Supine to Sit     Supine to sit: Modified independent (Device/Increase time)     General bed mobility comments: HOB 20 degrees with rail   Transfers Overall transfer level: Needs assistance Equipment used: Rolling walker (2 wheeled) Transfers: Sit to/from Stand Sit to Stand: Min guard         General transfer comment: Pt demoed good technique   Ambulation/Gait Ambulation/Gait assistance: Min guard Ambulation Distance (Feet): 600 Feet Assistive device: Rolling walker (2 wheeled) Gait Pattern/deviations: Step-through pattern;Decreased stride length     General Gait Details: Pt demonstrates step through pattern and able to perform heel strike B, VCs for position and posture (relaxation of shoulders) with RW. Emphasis on decreased UE  WB and increased LE WB.    Stairs Stairs: Yes   Stair Management: Step to pattern;Backwards;With walker Number of Stairs: 1 General stair comments: x2 trials with cues for sequence  Wheelchair Mobility    Modified Rankin (Stroke Patients Only)       Balance Overall balance assessment: Needs assistance Sitting-balance support: No upper extremity supported;Feet supported Sitting balance-Leahy Scale: Good     Standing balance support: Bilateral upper extremity supported;During functional activity Standing balance-Leahy Scale: Fair Standing balance comment: Reliant on RW for ambulation. Pt fair for static standing balance without UE support.                             Cognition Arousal/Alertness: Awake/alert Behavior During Therapy: WFL for tasks assessed/performed Overall Cognitive Status: Within Functional Limits for tasks assessed                                        Exercises Total Joint Exercises Quad Sets: AROM;Left;10 reps Long Arc Quad: AROM;Left;10 reps Goniometric ROM: 0-75    General Comments        Pertinent Vitals/Pain Pain Assessment: No/denies pain    Home Living                      Prior Function            PT Goals (current goals can now be found in the care plan section) Progress towards PT goals: Progressing toward goals    Frequency  7X/week      PT Plan Current plan remains appropriate    Co-evaluation              AM-PAC PT "6 Clicks" Daily Activity  Outcome Measure  Difficulty turning over in bed (including adjusting bedclothes, sheets and blankets)?: A Little Difficulty moving from lying on back to sitting on the side of the bed? : A Little Difficulty sitting down on and standing up from a chair with arms (e.g., wheelchair, bedside commode, etc,.)?: A Little Help needed moving to and from a bed to chair (including a wheelchair)?: A Little Help needed walking in hospital  room?: A Little Help needed climbing 3-5 steps with a railing? : A Little 6 Click Score: 18    End of Session Equipment Utilized During Treatment: Gait belt Activity Tolerance: Patient tolerated treatment well Patient left: in chair;with call bell/phone within reach Nurse Communication: Mobility status PT Visit Diagnosis: Other abnormalities of gait and mobility (R26.89)     Time: 8638-1771 PT Time Calculation (min) (ACUTE ONLY): 40 min  Charges:  $Gait Training: 23-37 mins $Therapeutic Exercise: 8-22 mins                    G Codes:       Elberta Leatherwood, SPT Acute Re[facility] [place] 11/30/2016, 10:00 AM

## 2016-11-30 NOTE — Progress Notes (Signed)
PATIENT ID: Samantha Woodard  MRN: [id number]  DOB/AGE:  15-Apr-1941 / 76 y.o.  2 Days Post-Op Procedure(s) (LRB): TOTAL KNEE ARTHROPLASTY (Left)    PROGRESS NOTE Subjective: Patient is alert, oriented, no Nausea, no Vomiting, yes passing gas. Taking PO well. Denies SOB, Chest or Calf Pain. Using Incentive Spirometer, PAS in place. Ambulate WBAT with pt walking 350 ft, Patient reports pain as mild .    Objective: Vital signs in last 24 hours: Vitals:   11/29/16 0500 11/29/16 1143 11/29/16 2104 11/30/16 0503  BP: (!) 122/51 126/64 128/60 112/69  Pulse: 70 68 75 75  Resp: 16  16 16   Temp: 98 F (36.7 C)  97.6 F (36.4 C) 97.7 F (36.5 C)  TempSrc: Oral  Oral Oral  SpO2: 99%  100% 99%  Weight:      Height:          Intake/Output from previous day: I/O last 3 completed shifts: In: 5615 [P.O.:480; I.V.:3725] Out: -    Intake/Output this shift: No intake/output data recorded.   LABORATORY DATA:  Recent Labs  11/29/16 0538 11/30/16 0450  WBC 10.2 9.0  HGB 9.9* 9.5*  HCT 31.5* 30.3*  PLT 288 281  NA 134*  --   K 3.9  --   CL 101  --   CO2 27  --   BUN 16  --   CREATININE 1.01*  --   GLUCOSE 126*  --   CALCIUM 8.5*  --     Examination: Neurologically intact Neurovascular intact Sensation intact distally Intact pulses distally Dorsiflexion/Plantar flexion intact Incision: dressing C/D/I No cellulitis present Compartment soft}  Assessment:   2 Days Post-Op Procedure(s) (LRB): TOTAL KNEE ARTHROPLASTY (Left) ADDITIONAL DIAGNOSIS: Expected Acute Blood Loss Anemia, CVD, neuropathy  Plan: PT/OT WBAT, AROM and PROM  DVT Prophylaxis:  SCDx72hrs, ASA 325 mg BID x 2 weeks DISCHARGE PLAN: Home DISCHARGE NEEDS: HHPT, Walker and 3-in-1 comode seat     Keimani Laufer R 11/30/2016, 8:09 AM

## 2017-01-05 ENCOUNTER — Ambulatory Visit: Payer: Medicare Other | Admitting: Podiatry

## 2017-01-16 ENCOUNTER — Ambulatory Visit (INDEPENDENT_AMBULATORY_CARE_PROVIDER_SITE_OTHER): Payer: Medicare Other | Admitting: Podiatry

## 2017-01-16 DIAGNOSIS — M79676 Pain in unspecified toe(s): Secondary | ICD-10-CM | POA: Diagnosis not present

## 2017-01-16 DIAGNOSIS — B351 Tinea unguium: Secondary | ICD-10-CM

## 2017-01-16 NOTE — Progress Notes (Signed)
Complaint:  Visit Type: Patient returns to my office for continued preventative foot care services. Complaint: Patient states" my nails have grown long and thick and become painful to walk and wear shoes"  The patient presents for preventative foot care services. No changes to ROS  Podiatric Exam: Vascular: dorsalis pedis and posterior tibial pulses are palpable bilateral. Capillary return is immediate. Temperature gradient is WNL. Skin turgor WNL  Sensorium: Normal Semmes Weinstein monofilament test. Normal tactile sensation bilaterally. Nail Exam: Pt has thick disfigured discolored nails with subungual debris noted bilateral entire nail hallux through fifth toenails Ulcer Exam: There is no evidence of ulcer or pre-ulcerative changes or infection. Orthopedic Exam: Muscle tone and strength are WNL. No limitations in general ROM. No crepitus or effusions noted. Foot type and digits show no abnormalities. Forefoot equinus foot type with hammer toes 2-5  B/L Skin: No Porokeratosis. No infection or ulcers.  Dry heels.  Diagnosis:  Onychomycosis, , Pain in right toe, pain in left toes  Treatment & Plan Procedures and Treatment: Consent by patient was obtained for treatment procedures. The patient understood the discussion of treatment and procedures well. All questions were answered thoroughly reviewed. Debridement of mycotic and hypertrophic toenails, 1 through 5 bilateral and clearing of subungual debris. No ulceration, no infection noted.  Told to use vaseline and chapstick.  Return Visit-Office Procedure: Patient instructed to return to the office for a follow up visit 3 months for continued evaluation and treatment.    Gardiner Barefoot DPM

## 2017-02-13 ENCOUNTER — Other Ambulatory Visit: Payer: Self-pay | Admitting: Obstetrics and Gynecology

## 2017-02-13 DIAGNOSIS — Z1231 Encounter for screening mammogram for malignant neoplasm of breast: Secondary | ICD-10-CM

## 2017-03-06 ENCOUNTER — Ambulatory Visit (INDEPENDENT_AMBULATORY_CARE_PROVIDER_SITE_OTHER): Payer: Medicare Other | Admitting: Obstetrics and Gynecology

## 2017-03-06 ENCOUNTER — Encounter: Payer: Self-pay | Admitting: Obstetrics and Gynecology

## 2017-03-06 VITALS — BP 128/80 | HR 100 | Ht 69.0 in | Wt 245.0 lb

## 2017-03-06 DIAGNOSIS — Z124 Encounter for screening for malignant neoplasm of cervix: Secondary | ICD-10-CM | POA: Diagnosis not present

## 2017-03-06 DIAGNOSIS — Z01419 Encounter for gynecological examination (general) (routine) without abnormal findings: Secondary | ICD-10-CM

## 2017-03-06 NOTE — Progress Notes (Signed)
Patient ID: Samantha Woodard, female   DOB: Feb 28, 1941, 76 y.o.   MRN: [id number]     Gynecology Annual Exam  PCP: Antony Contras, MD  Chief Complaint:  Chief Complaint  Patient presents with  . Gynecologic Exam    History of Present Illness:Patient is a 76 y.o. No obstetric history on file. presents for annual exam. The patient has no complaints today.   LMP: No LMP recorded. Patient has had a hysterectomy.  The patient is not sexually active.  The patient does perform self breast exams.  There is notable family history of breast or ovarian cancer in her family.  The patient wears seatbelts: yes.   The patient has regular exercise: not asked.   Underwent knee replacement this year.    The patient denies current symptoms of depression.     Review of Systems: Review of Systems  Constitutional: Negative for chills and fever.  HENT: Negative for congestion.   Respiratory: Negative for cough and shortness of breath.   Cardiovascular: Negative for chest pain and palpitations.  Gastrointestinal: Negative for abdominal pain, constipation, diarrhea, heartburn, nausea and vomiting.  Genitourinary: Negative for dysuria, frequency and urgency.  Skin: Negative for itching and rash.  Neurological: Negative for dizziness and headaches.  Endo/Heme/Allergies: Negative for polydipsia.  Psychiatric/Behavioral: Negative for depression.    Past Medical History:  Past Medical History:  Diagnosis Date  . Anemia   . Anxiety and depression   . Arthritis, degenerative    scoliosis  . Atrial fibrillation ([place])    history of- pt. reports thats why she is on Plavix. But the afib is resolved.    . Cerebrovascular disease   . Chronic kidney disease   . Chronic low back pain   . Complication of anesthesia 1970's   post op- vertigo, odd sensation with " feeling like I was going to fall", also reports that the anesth. team remarked that she had a quick" response to anesth.    . Diverticulosis   .  Dyslipidemia   . Fibromyalgia   . GERD (gastroesophageal reflux disease)   . HTN (hypertension)   . IBS (irritable bowel syndrome)   . Mild mitral valve prolapse   . Obesity   . OSA (obstructive sleep apnea)    tried CPAP, then Bipap, now on concentrator  . Peripheral neuropathy    feet  . Peripheral vascular disease ([place])   . Positional vertigo   . Scoliosis    lumbar 5  . Spina bifida occulta   . Stroke [facility])    pt. reports that she had TIA's in the past & has been on Plavix ever since.   . Varicose vein     Past Surgical History:  Past Surgical History:  Procedure Laterality Date  . ABDOMINAL HYSTERECTOMY    . CARDIAC CATHETERIZATION  1990's   no blockage   . CHOLECYSTECTOMY    . ENDOVENOUS ABLATION SAPHENOUS VEIN W/ LASER  10/22/2007  . gallbladder resection    . MINOR HEMORRHOIDECTOMY    . TONSILLECTOMY    . TOTAL KNEE ARTHROPLASTY Left 11/28/2016   Procedure: TOTAL KNEE ARTHROPLASTY;  Surgeon: Frederik Pear, MD;  Location: [place];  Service: Orthopedics;  Laterality: Left;    Gynecologic History:  No LMP recorded. Patient has had a hysterectomy. Last Pap: Results were: 1 year ago  no abnormalities  Last mammogram: 1 year ago   Obstetric History: No obstetric history on file.  Family History:  Family History  Problem Relation Age of  Onset  . Cancer Mother   . Hypertension Mother   . Varicose Veins Mother   . Heart attack Mother   . Heart attack Father   . Cancer Father   . Heart disease Father   . Hypertension Father   . Diabetes Sister   . Hypertension Sister   . Hyperlipidemia Sister   . Varicose Veins Sister   . Hypertension Brother   . Hyperlipidemia Brother   . Cerebrovascular Disease Maternal Grandmother   . Stroke Paternal Grandmother   . Hypertension Sister   . Hyperlipidemia Sister   . Varicose Veins Sister   . Carpal tunnel syndrome Sister     Social History:  Social History   Social History  . Marital status: Married    Spouse name:  N/A  . Number of children: 2  . Years of education: college 2   Occupational History  . Retired    Social History Main Topics  . Smoking status: Never Smoker  . Smokeless tobacco: Never Used  . Alcohol use No     Comment: on occasion  . Drug use: No  . Sexual activity: Not on file   Other Topics Concern  . Not on file   Social History Narrative   Patient is right handed.   Patient does not drink caffeine.    Allergies:  Allergies  Allergen Reactions  . Iodine Hives    IV CONTRAST  . Neomycin Hives and Rash  . Tape Hives and Rash    Medications: Prior to Admission medications   Medication Sig Start Date End Date Taking? Authorizing Provider  aspirin EC 81 MG tablet Take 81 mg by mouth daily with supper.   Yes [provider]  celecoxib (CELEBREX) 200 MG capsule Take 200 mg by mouth daily with lunch.  10/04/13  Yes [provider]  cetirizine (ZYRTEC) 10 MG tablet Take 10 mg by mouth at bedtime.   Yes [provider]  clopidogrel (PLAVIX) 75 MG tablet Take 0.5 tablets (37.5 mg total) by mouth daily. Patient taking differently: Take 37.5 mg by mouth daily with supper.  10/28/16  Yes Kathrynn Ducking, MD  fluticasone [facility]) 50 MCG/ACT nasal spray Place 2 sprays into both nostrils at bedtime.  08/16/13  Yes [provider]  gabapentin (NEURONTIN) 600 MG tablet Take 1 tablet (600 mg total) by mouth 3 (three) times daily. 11/21/16  Yes Kathrynn Ducking, MD  hydrochlorothiazide (HYDRODIURIL) 12.5 MG tablet Take 12.5 mg by mouth daily with lunch.  07/18/13  Yes [provider]  Hydrocortisone (MHDQQIWLN-98 EX) Apply 1 application topically 2 (two) times daily as needed (for insect bites/itchy skin).   Yes [provider]  lisinopril (PRINIVIL,ZESTRIL) 5 MG tablet Take 2.5 mg by mouth daily with lunch.  07/17/13  Yes [provider]  Multiple Vitamin (MULTIVITAMIN WITH MINERALS) TABS tablet Take 1 tablet by mouth daily  with supper. WOMEN'S 50+   Yes [provider]  pantoprazole (PROTONIX) 40 MG tablet Take 40 mg by mouth daily before breakfast. 30 MINS TO 1 HOUR BEFORE EATING. 07/26/13  Yes [provider]  Probiotic Product (ALIGN) 4 MG CAPS Take 4 mg by mouth daily with lunch.   Yes [provider]  Simethicone (PHAZYME) 180 MG CAPS Take 180 mg by mouth 2 (two) times daily as needed (FOR BLOATING/GAS).   Yes [provider]  simvastatin (ZOCOR) 20 MG tablet Take 20 mg by mouth every evening.  09/03/13  Yes [provider]  tetrahydrozoline-zinc (VISINE-AC) 0.05-0.25 % ophthalmic solution Place 2 drops into both eyes 3 (three) times daily as needed (for redness/itchy eyes.).   Yes [provider]  Wheat Dextrin (BENEFIBER) POWD Take 14.2 g by mouth 2 (two) times daily.    Yes [provider]    Physical Exam Vitals: Blood pressure 128/80, pulse 100, height 5\' 9"  (1.753 m), weight 245 lb (111.1 kg).  General: NAD HEENT: normocephalic, anicteric Thyroid: no enlargement, no palpable nodules Pulmonary: No increased work of breathing, CTAB Cardiovascular: RRR, distal pulses 2+ Breast: Breast symmetrical, no tenderness, no palpable nodules or masses, no skin or nipple retraction present, no nipple discharge.  No axillary or supraclavicular lymphadenopathy. Abdomen: NABS, soft, non-tender, non-distended.  Umbilicus without lesions.  No hepatomegaly, splenomegaly or masses palpable. No evidence of hernia  Genitourinary:  External: Normal external female genitalia.  Normal urethral meatus, normal  Bartholin's and Skene's glands.    Vagina: Surgically absent   Cervix: Surgically absent  Uterus: Non-enlarged, mobile, normal contour.  No CMT  Adnexa: ovaries non-enlarged, no adnexal masses  Rectal: deferred  Lymphatic: no evidence of inguinal lymphadenopathy Extremities: no edema, erythema, or tenderness Neurologic: Grossly intact Psychiatric: mood  appropriate, affect full  Female chaperone present for pelvic and breast  portions of the physical exam     Assessment: 76 y.o. No obstetric history on file. routine annual exam  Plan: Problem List Items Addressed This Visit    None    Visit Diagnoses    Cervical cancer screening    -  Primary   Relevant Orders   PapIG, HPV, rfx 16/18   Encounter for annual routine gynecological examination          1) Mammogram - recommend yearly screening mammogram.  Mammogram already scheduled end of October  2) STI screening was not offered  3) ASCCP guidelines and rational discussed.  Patient opts for yearly screening interval.  Despite date that we can discontinue pap patient adamant on continuing yearly pap  4) Routine healthcare maintenance including cholesterol, diabetes screening discussed managed by PCP  5) Colonoscopy up to date 2017  6) Follow up 1 year for routine annual

## 2017-03-08 LAB — PAPIG, HPV, RFX 16/18
HPV, HIGH-RISK: NEGATIVE
PAP Smear Comment: 0

## 2017-03-23 ENCOUNTER — Encounter: Payer: Self-pay | Admitting: Podiatry

## 2017-03-23 ENCOUNTER — Ambulatory Visit (INDEPENDENT_AMBULATORY_CARE_PROVIDER_SITE_OTHER): Payer: Medicare Other | Admitting: Podiatry

## 2017-03-23 DIAGNOSIS — M79676 Pain in unspecified toe(s): Secondary | ICD-10-CM | POA: Diagnosis not present

## 2017-03-23 DIAGNOSIS — D689 Coagulation defect, unspecified: Secondary | ICD-10-CM

## 2017-03-23 DIAGNOSIS — B351 Tinea unguium: Secondary | ICD-10-CM

## 2017-03-23 NOTE — Progress Notes (Signed)
Complaint:  Visit Type: Patient returns to my office for continued preventative foot care services. Complaint: Patient states" my nails have grown long and thick and become painful to walk and wear shoes"  The patient presents for preventative foot care services. No changes to ROS.  Patient is taking plavix.  Podiatric Exam: Vascular: dorsalis pedis and posterior tibial pulses are palpable bilateral. Capillary return is immediate. Temperature gradient is WNL. Skin turgor WNL  Sensorium: Normal Semmes Weinstein monofilament test. Normal tactile sensation bilaterally. Nail Exam: Pt has thick disfigured discolored nails with subungual debris noted bilateral entire nail hallux through fifth toenails Ulcer Exam: There is no evidence of ulcer or pre-ulcerative changes or infection. Orthopedic Exam: Muscle tone and strength are WNL. No limitations in general ROM. No crepitus or effusions noted. Foot type and digits show no abnormalities. Forefoot equinus foot type with hammer toes 2-5  B/L Skin: No Porokeratosis. No infection or ulcers.  Dry heels but better  Diagnosis:  Onychomycosis, , Pain in right toe, pain in left toes  Treatment & Plan Procedures and Treatment: Consent by patient was obtained for treatment procedures. The patient understood the discussion of treatment and procedures well. All questions were answered thoroughly reviewed. Debridement of mycotic and hypertrophic toenails, 1 through 5 bilateral and clearing of subungual debris. No ulceration, no infection noted.    Return Visit-Office Procedure: Patient instructed to return to the office for a follow up visit 3 months for continued evaluation and treatment.    Gardiner Barefoot DPM

## 2017-03-28 ENCOUNTER — Ambulatory Visit
Admission: RE | Admit: 2017-03-28 | Discharge: 2017-03-28 | Disposition: A | Discharge status: Home / Self Care | Payer: Medicare Other | Source: Ambulatory Visit | Attending: Obstetrics and Gynecology | Admitting: Obstetrics and Gynecology

## 2017-03-28 DIAGNOSIS — Z1231 Encounter for screening mammogram for malignant neoplasm of breast: Secondary | ICD-10-CM | POA: Diagnosis not present

## 2017-04-17 ENCOUNTER — Encounter: Payer: Self-pay | Admitting: Adult Health

## 2017-04-17 ENCOUNTER — Ambulatory Visit: Payer: Medicare Other | Admitting: Adult Health

## 2017-04-17 VITALS — BP 147/80 | HR 78 | Wt 244.4 lb

## 2017-04-17 DIAGNOSIS — G63 Polyneuropathy in diseases classified elsewhere: Secondary | ICD-10-CM | POA: Diagnosis not present

## 2017-04-17 NOTE — Progress Notes (Signed)
I have read the note, and I agree with the clinical assessment and plan.  Samantha Woodard   

## 2017-04-17 NOTE — Progress Notes (Signed)
PATIENT: Samantha Woodard DOB: September 01, 1940  REASON FOR VISIT: follow up HISTORY FROM: patient  HISTORY OF PRESENT ILLNESS: Today 04/17/17 Samantha Woodard 76 year old female with a history of peripheral neuropathy.  She returns today for follow-up.  She reports that her gabapentin continues to help with her neuropathy.  She states on occasion she will have sharp shooting pains in the toes but this is infrequent.  She states that she did have an epidural steroid injection with Dr. Jacelyn Grip.  She states this was beneficial for her right-sided back pain that radiated down the right leg.  She states that she also had knee surgery June 25.  She states that this went well.  She denies any recent falls.  Denies any changes with her gait or balance.  She does not use a cane or walker.  She returns today for an evaluation.  HISTORY Samantha Woodard is a 76 year old right-handed white female with a history of a peripheral neuropathy. The patient has been on gabapentin for this with some benefit. The patient still has some discomfort in the balls of the feet bilaterally. She has begun to have episodes of right-sided sciatica pain that starts in her right buttock and goes all the way down to the foot mainly into the right great toe. The patient will have sensation of weakness at the ankle when the pain is severe. She has been followed by Dr. Mina Marble for pain control, they are considering an injection for the pain. The patient also has occasional events where she feels a warm sensation down both legs with some transient weakness of the legs that may occur for several minutes. These episodes have occurred over several years, usually occurring once or twice a year, but the frequency has increased some recently occurring 2 or 3 times a year. The patient is unable to ambulate during these events. The patient denies any recent falls, she may use a cane for ambulation. She has bilateral knee arthritis, she will be going in for a left total  knee replacement in June 2018. Eventually, the patient may need surgery on the right knee as well. She returns to the office today for an evaluation. She brings in a disc that includes an x-ray of the low back and MRI evaluation of the low back. The patient has scoliosis of the spine, and a prominent A/P curvature of the low back. The spinal canal is not severely compromised, no severe neuroforaminal stenosis is seen by my review.   REVIEW OF SYSTEMS: Out of a complete 14 system review of symptoms, the patient complains only of the following symptoms, and all other reviewed systems are negative.   Diarrhea, apnea, incontinence of bowels, urgency, joint pain, joint swelling, back pain, aching muscles, numbness, bruise/bleed easily  ALLERGIES: Allergies  Allergen Reactions  . Iodine Hives    IV CONTRAST  . Neomycin Hives and Rash  . Tape Hives and Rash    HOME MEDICATIONS: Outpatient Medications Prior to Visit  Medication Sig Dispense Refill  . acetaminophen (TYLENOL) 500 MG tablet Take 500 mg by mouth.    Marland Kitchen aspirin EC 81 MG tablet Take 81 mg by mouth daily with supper.    . carisoprodol (SOMA) 350 MG tablet Take 350 mg by mouth. Take 1/2 - 1 as needed    . celecoxib (CELEBREX) 200 MG capsule Take 200 mg by mouth daily with lunch.     . cetirizine (ZYRTEC) 10 MG tablet Take 10 mg by mouth at bedtime.    Marland Kitchen  clopidogrel (PLAVIX) 75 MG tablet Take 0.5 tablets (37.5 mg total) by mouth daily. (Patient taking differently: Take 37.5 mg by mouth daily with supper. ) 45 tablet 3  . fluticasone (FLONASE) 50 MCG/ACT nasal spray Place 2 sprays into both nostrils at bedtime.     . gabapentin (NEURONTIN) 600 MG tablet Take 1 tablet (600 mg total) by mouth 3 (three) times daily. 270 tablet 3  . hydrochlorothiazide (HYDRODIURIL) 12.5 MG tablet Take 12.5 mg by mouth daily with lunch.     . Hydrocortisone (BZJIRCVEL-38 EX) Apply 1 application topically 2 (two) times daily as needed (for insect bites/itchy  skin).    Marland Kitchen lisinopril (PRINIVIL,ZESTRIL) 5 MG tablet Take 2.5 mg by mouth daily with lunch.     . Multiple Vitamin (MULTIVITAMIN WITH MINERALS) TABS tablet Take 1 tablet by mouth daily with supper. WOMEN'S 50+    . pantoprazole (PROTONIX) 40 MG tablet Take 40 mg by mouth daily before breakfast. 30 MINS TO 1 HOUR BEFORE EATING.    . Probiotic Product (ALIGN) 4 MG CAPS Take 4 mg by mouth daily with lunch.    . Simethicone (PHAZYME) 180 MG CAPS Take 180 mg by mouth 2 (two) times daily as needed (FOR BLOATING/GAS).    Marland Kitchen simvastatin (ZOCOR) 20 MG tablet Take 20 mg by mouth every evening.     Marland Kitchen tetrahydrozoline-zinc (VISINE-AC) 0.05-0.25 % ophthalmic solution Place 2 drops into both eyes 3 (three) times daily as needed (for redness/itchy eyes.).    Marland Kitchen Wheat Dextrin (BENEFIBER) POWD Take 14.2 g by mouth 2 (two) times daily.      No facility-administered medications prior to visit.     PAST MEDICAL HISTORY: Past Medical History:  Diagnosis Date  . Anemia   . Anxiety and depression   . Arthritis, degenerative    scoliosis  . Atrial fibrillation ([place])    history of- pt. reports thats why she is on Plavix. But the afib is resolved.    . Cerebrovascular disease   . Chronic kidney disease   . Chronic low back pain   . Complication of anesthesia 1970's   post op- vertigo, odd sensation with " feeling like I was going to fall", also reports that the anesth. team remarked that she had a quick" response to anesth.    . Diverticulosis   . Dyslipidemia   . Fibromyalgia   . GERD (gastroesophageal reflux disease)   . HTN (hypertension)   . IBS (irritable bowel syndrome)   . Mild mitral valve prolapse   . Obesity   . OSA (obstructive sleep apnea)    tried CPAP, then Bipap, now on concentrator  . Peripheral neuropathy    feet  . Peripheral vascular disease ([place])   . Positional vertigo   . Scoliosis    lumbar 5  . Spina bifida occulta   . Stroke [facility])    pt. reports that she had TIA's in the  past & has been on Plavix ever since.   . Varicose vein     PAST SURGICAL HISTORY: Past Surgical History:  Procedure Laterality Date  . ABDOMINAL HYSTERECTOMY    . CARDIAC CATHETERIZATION  1990's   no blockage   . CHOLECYSTECTOMY    . ENDOVENOUS ABLATION SAPHENOUS VEIN W/ LASER  10/22/2007  . gallbladder resection    . MINOR HEMORRHOIDECTOMY    . TONSILLECTOMY      FAMILY HISTORY: Family History  Problem Relation Age of Onset  . Cancer Mother   . Hypertension Mother   .  Varicose Veins Mother   . Heart attack Mother   . Heart attack Father   . Cancer Father   . Heart disease Father   . Hypertension Father   . Diabetes Sister   . Hypertension Sister   . Hyperlipidemia Sister   . Varicose Veins Sister   . Hypertension Brother   . Hyperlipidemia Brother   . Cerebrovascular Disease Maternal Grandmother   . Stroke Paternal Grandmother   . Hypertension Sister   . Hyperlipidemia Sister   . Varicose Veins Sister   . Carpal tunnel syndrome Sister     SOCIAL HISTORY: Social History   Socioeconomic History  . Marital status: Married    Spouse name: Not on file  . Number of children: 2  . Years of education: college 2  . Highest education level: Not on file  Social Needs  . Financial resource strain: Not on file  . Food insecurity - worry: Not on file  . Food insecurity - inability: Not on file  . Transportation needs - medical: Not on file  . Transportation needs - non-medical: Not on file  Occupational History  . Occupation: Retired  Tobacco Use  . Smoking status: Never Smoker  . Smokeless tobacco: Never Used  Substance and Sexual Activity  . Alcohol use: No    Comment: on occasion  . Drug use: No  . Sexual activity: Not on file  Other Topics Concern  . Not on file  Social History Narrative   Patient is right handed.   Patient does not drink caffeine.      PHYSICAL EXAM  Vitals:   04/17/17 1349  BP: (!) 147/80  Pulse: 78  Weight: 244 lb 6.4 oz  (110.9 kg)   Body mass index is 36.09 kg/m.  Generalized: Well developed, in no acute distress   Neurological examination  Mentation: Alert oriented to time, place, history taking. Follows all commands speech and language fluent Cranial nerve II-XII: Pupils were equal round reactive to light. Extraocular movements were full, visual field were full on confrontational test. Facial sensation and strength were normal. Uvula tongue midline. Head turning and shoulder shrug  were normal and symmetric. Motor: The motor testing reveals 5 over 5 strength of all 4 extremities. Good symmetric motor tone is noted throughout.  Sensory: Sensory testing is intact to soft touch on all 4 extremities. No evidence of extinction is noted.  Coordination: Cerebellar testing reveals good finger-nose-finger and heel-to-shin bilaterally.  Gait and station: Gait is normal.  Reflexes: Deep tendon reflexes are symmetric and normal bilaterally.   DIAGNOSTIC DATA (LABS, IMAGING, TESTING) - I reviewed patient records, labs, notes, testing and imaging myself where available.  Lab Results  Component Value Date   WBC 9.0 11/30/2016   HGB 9.5 (L) 11/30/2016   HCT 30.3 (L) 11/30/2016   MCV 91.8 11/30/2016   PLT 281 11/30/2016      Component Value Date/Time   NA 134 (L) 11/29/2016 0538   K 3.9 11/29/2016 0538   CL 101 11/29/2016 0538   CO2 27 11/29/2016 0538   GLUCOSE 126 (H) 11/29/2016 0538   BUN 16 11/29/2016 0538   CREATININE 1.01 (H) 11/29/2016 0538   CALCIUM 8.5 (L) 11/29/2016 0538   GFRNONAA 52 (L) 11/29/2016 0538   GFRAA >60 11/29/2016 0538     ASSESSMENT AND PLAN 76 y.o. year old female  has a past medical history of Anemia, Anxiety and depression, Arthritis, degenerative, Atrial fibrillation ([place]), Cerebrovascular disease, Chronic kidney disease, Chronic low  back pain, Complication of anesthesia (1970's), Diverticulosis, Dyslipidemia, Fibromyalgia, GERD (gastroesophageal reflux disease), HTN  (hypertension), IBS (irritable bowel syndrome), Mild mitral valve prolapse, Obesity, OSA (obstructive sleep apnea), Peripheral neuropathy, Peripheral vascular disease ([place]), Positional vertigo, Scoliosis, Spina bifida occulta, Stroke ([place]), and Varicose vein. here with:  1.  Peripheral neuropathy  Overall the patient has remained stable.  She will continue on gabapentin.  I have advised that if her symptoms worsen or she develops new symptoms she should let us know.  She will follow-up in 6 months or sooner if needed.  I spent 15 minutes with the patient. 50% of this time was spent discussing her medication.   Ward Givens, MSN, NP-C 04/17/2017, 2:11 PM [facility] [address], [place], [place] [address] [phone]

## 2017-04-17 NOTE — Patient Instructions (Signed)
Your Plan:  Continue gabapentin  If your symptoms worsen or you develop new symptoms please let us know.    Thank you for coming to see us at [facility]. I hope we have been able to provide you high quality care today.  You may receive a patient satisfaction survey over the next few weeks. We would appreciate your feedback and comments so that we may continue to improve ourselves and the health of our patients.  

## 2017-04-24 ENCOUNTER — Ambulatory Visit: Payer: Medicare Other | Admitting: Podiatry

## 2017-06-01 ENCOUNTER — Ambulatory Visit: Payer: Medicare Other | Admitting: Podiatry

## 2017-06-01 ENCOUNTER — Encounter: Payer: Self-pay | Admitting: Podiatry

## 2017-06-01 DIAGNOSIS — D689 Coagulation defect, unspecified: Secondary | ICD-10-CM

## 2017-06-01 DIAGNOSIS — B351 Tinea unguium: Secondary | ICD-10-CM | POA: Diagnosis not present

## 2017-06-01 DIAGNOSIS — M79676 Pain in unspecified toe(s): Secondary | ICD-10-CM

## 2017-06-01 NOTE — Progress Notes (Signed)
Complaint:  Visit Type: Patient returns to my office for continued preventative foot care services. Complaint: Patient states" my nails have grown long and thick and become painful to walk and wear shoes"  The patient presents for preventative foot care services. No changes to ROS.  Patient is taking plavix. Patient says her second toe right caused her big toe to bleed and asks it be looked at. Podiatric Exam: Vascular: dorsalis pedis and posterior tibial pulses are palpable bilateral. Capillary return is immediate. Temperature gradient is WNL. Skin turgor WNL  Sensorium: Normal Semmes Weinstein monofilament test. Normal tactile sensation bilaterally. Nail Exam: Pt has thick disfigured discolored nails with subungual debris noted bilateral entire nail hallux through fifth toenails Ulcer Exam: There is no evidence of ulcer or pre-ulcerative changes or infection. Orthopedic Exam: Muscle tone and strength are WNL. No limitations in general ROM. No crepitus or effusions noted. Foot type and digits show no abnormalities. Forefoot equinus foot type with hammer toes 2-5  B/L Skin: No Porokeratosis. No infection or ulcers.    Diagnosis:  Onychomycosis, , Pain in right toe, pain in left toes  Treatment & Plan Procedures and Treatment: Consent by patient was obtained for treatment procedures. The patient understood the discussion of treatment and procedures well. All questions were answered thoroughly reviewed. Debridement of mycotic and hypertrophic toenails, 1 through 5 bilateral and clearing of subungual debris. No ulceration, no infection noted.    Return Visit-Office Procedure: Patient instructed to return to the office for a follow up visit 3 months for continued evaluation and treatment.    Gardiner Barefoot DPM

## 2017-07-24 ENCOUNTER — Telehealth: Payer: Self-pay

## 2017-07-24 NOTE — Telephone Encounter (Signed)
Per pt's PCP, Dr. Marijo File c [facility] in [place], pt needs bone density done.  Her last one was 6-7 yrs ago ordered by [facility].  Please send order in to [place].  [phone]

## 2017-07-24 NOTE — Telephone Encounter (Signed)
Please advise 

## 2017-07-26 ENCOUNTER — Other Ambulatory Visit: Payer: Self-pay | Admitting: Obstetrics and Gynecology

## 2017-07-26 DIAGNOSIS — Z780 Asymptomatic menopausal state: Secondary | ICD-10-CM

## 2017-07-26 DIAGNOSIS — Z13820 Encounter for screening for osteoporosis: Secondary | ICD-10-CM

## 2017-07-26 NOTE — Telephone Encounter (Signed)
Pt's husband aware...

## 2017-07-26 NOTE — Telephone Encounter (Signed)
Order in.

## 2017-08-29 ENCOUNTER — Ambulatory Visit
Admission: RE | Admit: 2017-08-29 | Discharge: 2017-08-29 | Disposition: A | Discharge status: Home / Self Care | Payer: Medicare Other | Source: Ambulatory Visit | Attending: Obstetrics and Gynecology | Admitting: Obstetrics and Gynecology

## 2017-08-29 DIAGNOSIS — Z780 Asymptomatic menopausal state: Secondary | ICD-10-CM | POA: Diagnosis not present

## 2017-08-29 DIAGNOSIS — Z13820 Encounter for screening for osteoporosis: Secondary | ICD-10-CM | POA: Insufficient documentation

## 2017-08-31 ENCOUNTER — Encounter: Payer: Self-pay | Admitting: Podiatry

## 2017-08-31 ENCOUNTER — Ambulatory Visit: Payer: Medicare Other | Admitting: Podiatry

## 2017-08-31 DIAGNOSIS — B351 Tinea unguium: Secondary | ICD-10-CM

## 2017-08-31 DIAGNOSIS — M79676 Pain in unspecified toe(s): Secondary | ICD-10-CM

## 2017-08-31 DIAGNOSIS — D689 Coagulation defect, unspecified: Secondary | ICD-10-CM | POA: Diagnosis not present

## 2017-08-31 NOTE — Progress Notes (Signed)
Complaint:  Visit Type: Patient returns to my office for continued preventative foot care services. Complaint: Patient states" my nails have grown long and thick and become painful to walk and wear shoes"  The patient presents for preventative foot care services. No changes to ROS.  Patient is taking plavix.  Podiatric Exam: Vascular: dorsalis pedis and posterior tibial pulses are palpable bilateral. Capillary return is immediate. Temperature gradient is WNL. Skin turgor WNL  Sensorium: Normal Semmes Weinstein monofilament test. Normal tactile sensation bilaterally. Nail Exam: Pt has thick disfigured discolored nails with subungual debris noted bilateral entire nail hallux through fifth toenails Ulcer Exam: There is no evidence of ulcer or pre-ulcerative changes or infection. Orthopedic Exam: Muscle tone and strength are WNL. No limitations in general ROM. No crepitus or effusions noted. Foot type and digits show no abnormalities. Forefoot equinus foot type with hammer toes 2-5  B/L Skin: No Porokeratosis. No infection or ulcers.    Diagnosis:  Onychomycosis, , Pain in right toe, pain in left toes  Treatment & Plan Procedures and Treatment: Consent by patient was obtained for treatment procedures. The patient understood the discussion of treatment and procedures well. All questions were answered thoroughly reviewed. Debridement of mycotic and hypertrophic toenails, 1 through 5 bilateral and clearing of subungual debris. No ulceration, no infection noted.    Return Visit-Office Procedure: Patient instructed to return to the office for a follow up visit 3 months for continued evaluation and treatment.    Anjel Perfetti DPM 

## 2017-09-26 ENCOUNTER — Telehealth: Payer: Self-pay

## 2017-09-26 NOTE — Telephone Encounter (Signed)
Please review when you get back.

## 2017-09-26 NOTE — Telephone Encounter (Signed)
Pt calling wanting results from bone density report and also wants copy of results sent to PCP. [facility]MS pt

## 2017-09-27 NOTE — Telephone Encounter (Signed)
Pt calling for results of bone density done 3/26.  Also would like results of bone density sent to her PCP, Dr. Marijo File.  Pt's # [phone]

## 2017-10-02 NOTE — Telephone Encounter (Signed)
Bone density was normal, Dr. Rockwell Germany should be able to see it on Epic if not she'll need to sign a release I believe

## 2017-10-02 NOTE — Telephone Encounter (Signed)
Lm  c female to have pt call me.

## 2017-10-02 NOTE — Telephone Encounter (Signed)
Spoke c pt.  Will fax Dexa to Dr. Rockwell Germany at pt's request.

## 2017-10-16 ENCOUNTER — Ambulatory Visit: Payer: Medicare Other | Admitting: Adult Health

## 2017-10-16 ENCOUNTER — Encounter: Payer: Self-pay | Admitting: Adult Health

## 2017-10-16 VITALS — BP 116/80 | HR 72 | Ht 69.0 in | Wt 231.0 lb

## 2017-10-16 DIAGNOSIS — G63 Polyneuropathy in diseases classified elsewhere: Secondary | ICD-10-CM

## 2017-10-16 NOTE — Progress Notes (Signed)
I have read the note, and I agree with the clinical assessment and plan.  Misty Rago K Cassidie Veiga   

## 2017-10-16 NOTE — Progress Notes (Signed)
PATIENT: Samantha [place] DOB: 05-May-1941  REASON FOR VISIT: follow up HISTORY FROM: patient  HISTORY OF PRESENT ILLNESS: Today 10/16/17 Samantha [place] is a 77 year old female with a history of peripheral neuropathy.  She returns today for follow-up.  She remains on gabapentin 600 mg 3 times a day.  She reports that this continues to be beneficial for her neuropathy.  She denies any significant changes with her gait or balance.  Denies any falls.  She reports that she is no longer having sharp shooting pains in the toes.  She states that she did have a fall in February.  She states that this jarred her back and knee.  She states that a couple weeks ago she was reaching to lift a plant and injuring the right side of her back.  She states that Dr. Verl Blalock  gave her an epidural steroid injection and she has found that very beneficial.  She states that she has been walking normally after the steroid injection.  Overall she feels that she is doing well.  She returns today for evaluation.  HISTORY 04/17/17 (Copied from Previous note): 04/17/17 Samantha [place] 77 year old female with a history of peripheral neuropathy.  She returns today for follow-up.  She reports that her gabapentin continues to help with her neuropathy.  She states on occasion she will have sharp shooting pains in the toes but this is infrequent.  She states that she did have an epidural steroid injection with Dr. Jacelyn Grip.  She states this was beneficial for her right-sided back pain that radiated down the right leg.  She states that she also had knee surgery June 25.  She states that this went well.  She denies any recent falls.  Denies any changes with her gait or balance.  She does not use a cane or walker.  She returns today for an evaluation.   REVIEW OF SYSTEMS: Out of a complete 14 system review of symptoms, the patient complains only of the following symptoms, and all other reviewed systems are negative.  Frequency of urination, urgency,  walking difficulty,/bleed easily, and allergies  ALLERGIES: Allergies  Allergen Reactions  . Iodine Hives    IV CONTRAST  . Neomycin Hives and Rash  . Tape Hives and Rash    HOME MEDICATIONS: Outpatient Medications Prior to Visit  Medication Sig Dispense Refill  . acetaminophen (TYLENOL) 500 MG tablet Take 500 mg by mouth.    Marland Kitchen aspirin EC 81 MG tablet Take 81 mg by mouth daily with supper.    . carisoprodol (SOMA) 350 MG tablet Take 350 mg by mouth. Take 1/2 - 1 as needed    . celecoxib (CELEBREX) 200 MG capsule Take 200 mg by mouth daily with lunch.     . cetirizine (ZYRTEC) 10 MG tablet Take 10 mg by mouth at bedtime.    . clopidogrel (PLAVIX) 75 MG tablet Take 0.5 tablets (37.5 mg total) by mouth daily. (Patient taking differently: Take 37.5 mg by mouth daily with supper. ) 45 tablet 3  . fluticasone (FLONASE) 50 MCG/ACT nasal spray Place 2 sprays into both nostrils at bedtime.     . gabapentin (NEURONTIN) 600 MG tablet Take 1 tablet (600 mg total) by mouth 3 (three) times daily. 270 tablet 3  . hydrochlorothiazide (HYDRODIURIL) 12.5 MG tablet Take 12.5 mg by mouth daily with lunch.     . Hydrocortisone (IRWERXVQM-08 EX) Apply 1 application topically 2 (two) times daily as needed (for insect bites/itchy skin).    Marland Kitchen  lisinopril (PRINIVIL,ZESTRIL) 5 MG tablet Take 2.5 mg by mouth daily with lunch.     . Multiple Vitamin (MULTIVITAMIN WITH MINERALS) TABS tablet Take 1 tablet by mouth daily with supper. WOMEN'S 50+    . pantoprazole (PROTONIX) 40 MG tablet Take 40 mg by mouth daily before breakfast. 30 MINS TO 1 HOUR BEFORE EATING.    . Probiotic Product (ALIGN) 4 MG CAPS Take 4 mg by mouth daily with lunch.    Marland Kitchen SHINGRIX injection     . Simethicone (PHAZYME) 180 MG CAPS Take 180 mg by mouth 2 (two) times daily as needed (FOR BLOATING/GAS).    Marland Kitchen simvastatin (ZOCOR) 20 MG tablet Take 20 mg by mouth every evening.     Marland Kitchen tetrahydrozoline-zinc (VISINE-AC) 0.05-0.25 % ophthalmic solution  Place 2 drops into both eyes 3 (three) times daily as needed (for redness/itchy eyes.).    Marland Kitchen Wheat Dextrin (BENEFIBER) POWD Take 14.2 g by mouth 2 (two) times daily.      No facility-administered medications prior to visit.     PAST MEDICAL HISTORY: Past Medical History:  Diagnosis Date  . Anemia   . Anxiety and depression   . Arthritis, degenerative    scoliosis  . Atrial fibrillation ([place])    history of- pt. reports thats why she is on Plavix. But the afib is resolved.    . Cerebrovascular disease   . Chronic kidney disease   . Chronic low back pain   . Complication of anesthesia 1970's   post op- vertigo, odd sensation with " feeling like I was going to fall", also reports that the anesth. team remarked that she had a quick" response to anesth.    . Diverticulosis   . Dyslipidemia   . Fibromyalgia   . GERD (gastroesophageal reflux disease)   . HTN (hypertension)   . IBS (irritable bowel syndrome)   . Mild mitral valve prolapse   . Obesity   . OSA (obstructive sleep apnea)    tried CPAP, then Bipap, now on concentrator  . Peripheral neuropathy    feet  . Peripheral vascular disease ([place])   . Positional vertigo   . Scoliosis    lumbar 5  . Spina bifida occulta   . Stroke [facility])    pt. reports that she had TIA's in the past & has been on Plavix ever since.   . Varicose vein     PAST SURGICAL HISTORY: Past Surgical History:  Procedure Laterality Date  . ABDOMINAL HYSTERECTOMY    . CARDIAC CATHETERIZATION  1990's   no blockage   . CHOLECYSTECTOMY    . ENDOVENOUS ABLATION SAPHENOUS VEIN W/ LASER  10/22/2007  . gallbladder resection    . MINOR HEMORRHOIDECTOMY    . TONSILLECTOMY    . 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 Cancer Mother   . Hypertension Mother   .  Varicose Veins Mother   . Heart attack Mother   . Heart attack Father   . Cancer Father   . Heart disease Father   . Hypertension Father   . Diabetes Sister   . Hypertension Sister   . Hyperlipidemia Sister   . Varicose Veins Sister   . Hypertension Brother   . Hyperlipidemia Brother   . Cerebrovascular Disease Maternal Grandmother   . Stroke  Paternal Grandmother   . Hypertension Sister   . Hyperlipidemia Sister   . Varicose Veins Sister   . Carpal tunnel syndrome Sister     SOCIAL HISTORY: Social History   Socioeconomic History  . Marital status: Married    Spouse name: Not on file  . Number of children: 2  . Years of education: college 2  . Highest education level: Not on file  Occupational History  . Occupation: Retired  Scientific laboratory technician  . Financial resource strain: Not on file  . Food insecurity:    Worry: Not on file    Inability: Not on file  . Transportation needs:    Medical: Not on file    Non-medical: Not on file  Tobacco Use  . Smoking status: Never Smoker  . Smokeless tobacco: Never Used  Substance and Sexual Activity  . Alcohol use: No    Comment: on occasion  . Drug use: No  . Sexual activity: Not on file  Lifestyle  . Physical activity:    Days per week: Not on file    Minutes per session: Not on file  . Stress: Not on file  Relationships  . Social connections:    Talks on phone: Not on file    Gets together: Not on file    Attends religious service: Not on file    Active member of club or organization: Not on file    Attends meetings of clubs or organizations: Not on file    Relationship status: Not on file  . Intimate partner violence:    Fear of current or ex partner: Not on file    Emotionally abused: Not on file    Physically abused: Not on file    Forced sexual activity: Not on file  Other Topics Concern  . Not on file  Social History Narrative   Patient is right handed.   Patient does not drink caffeine.      PHYSICAL  EXAM  Vitals:   10/16/17 1314  BP: 116/80  Pulse: 72  Weight: 231 lb (104.8 kg)  Height: 5\' 9"  (1.753 m)   Body mass index is 34.11 kg/m.  Generalized: Well developed, in no acute distress   Neurological examination  Mentation: Alert oriented to time, place, history taking. Follows all commands speech and language fluent Cranial nerve II-XII: Pupils were equal round reactive to light. Extraocular movements were full, visual field were full on confrontational test. Facial sensation and strength were normal. Uvula tongue midline. Head turning and shoulder shrug  were normal and symmetric. Motor: The motor testing reveals 5 over 5 strength of all 4 extremities. Good symmetric motor tone is noted throughout.  Sensory: Sensory testing is intact to soft touch on all 4 extremities. No evidence of extinction is noted.  Coordination: Cerebellar testing reveals good finger-nose-finger and heel-to-shin bilaterally.  Gait and station: Gait is normal. Tandem gait is normal. Romberg is negative. No drift is seen.  Reflexes: Deep tendon reflexes are symmetric and normal bilaterally.   DIAGNOSTIC DATA (LABS, IMAGING, TESTING) - I reviewed patient records, labs, notes, testing and imaging myself where available.  Lab Results  Component Value Date   WBC 9.0 11/30/2016   HGB 9.5 (L) 11/30/2016   HCT 30.3 (L) 11/30/2016   MCV 91.8 11/30/2016   PLT 281 11/30/2016      Component Value Date/Time   NA 134 (L) 11/29/2016 0538   K 3.9 11/29/2016 0538   CL 101 11/29/2016 0538   CO2 27  11/29/2016 0538   GLUCOSE 126 (H) 11/29/2016 0538   BUN 16 11/29/2016 0538   CREATININE 1.01 (H) 11/29/2016 0538   CALCIUM 8.5 (L) 11/29/2016 0538   GFRNONAA 52 (L) 11/29/2016 0538   GFRAA >60 11/29/2016 0538      ASSESSMENT AND PLAN 77 y.o. year old female  has a past medical history of Anemia, Anxiety and depression, Arthritis, degenerative, Atrial fibrillation ([place]), Cerebrovascular disease, Chronic kidney  disease, Chronic low back pain, Complication of anesthesia (1970's), Diverticulosis, Dyslipidemia, Fibromyalgia, GERD (gastroesophageal reflux disease), HTN (hypertension), IBS (irritable bowel syndrome), Mild mitral valve prolapse, Obesity, OSA (obstructive sleep apnea), Peripheral neuropathy, Peripheral vascular disease ([place]), Positional vertigo, Scoliosis, Spina bifida occulta, Stroke ([place]), and Varicose vein. here with :  1.  Peripheral neuropathy  Overall the patient is doing well.  She will continue on gabapentin 600 mg 3 times a day.  She is advised that if her symptoms worsen or she develops new symptoms she should let us know.  She will follow-up in 1 year or sooner if needed.   I spent 15 minutes with the patient. 50% of this time was spent discussing her symptoms and medication.   Ward Givens, MSN, NP-C 10/16/2017, 1:01 PM [facility] [address] [place], [place] [address] [phone]

## 2017-10-16 NOTE — Patient Instructions (Signed)
Your Plan:  Continue gabapentin  If your symptoms worsen or you develop new symptoms please let us know.    Thank you for coming to see us at [facility]. I hope we have been able to provide you high quality care today.  You may receive a patient satisfaction survey over the next few weeks. We would appreciate your feedback and comments so that we may continue to improve ourselves and the health of our patients.  

## 2017-11-16 ENCOUNTER — Encounter: Payer: Self-pay | Admitting: Podiatry

## 2017-11-16 ENCOUNTER — Ambulatory Visit: Payer: Medicare Other | Admitting: Podiatry

## 2017-11-16 DIAGNOSIS — B351 Tinea unguium: Secondary | ICD-10-CM

## 2017-11-16 DIAGNOSIS — D689 Coagulation defect, unspecified: Secondary | ICD-10-CM

## 2017-11-16 DIAGNOSIS — M79676 Pain in unspecified toe(s): Secondary | ICD-10-CM | POA: Diagnosis not present

## 2017-11-16 NOTE — Progress Notes (Signed)
Complaint:  Visit Type: Patient returns to my office for continued preventative foot care services. Complaint: Patient states" my nails have grown long and thick and become painful to walk and wear shoes"  The patient presents for preventative foot care services. No changes to ROS.  Patient is taking plavix.  Podiatric Exam: Vascular: dorsalis pedis and posterior tibial pulses are palpable bilateral. Capillary return is immediate. Temperature gradient is WNL. Skin turgor WNL  Sensorium: Normal Semmes Weinstein monofilament test. Normal tactile sensation bilaterally. Nail Exam: Pt has thick disfigured discolored nails with subungual debris noted bilateral entire nail hallux through fifth toenails Ulcer Exam: There is no evidence of ulcer or pre-ulcerative changes or infection. Orthopedic Exam: Muscle tone and strength are WNL. No limitations in general ROM. No crepitus or effusions noted. Foot type and digits show no abnormalities. Forefoot equinus foot type with hammer toes 2-5  B/L Skin: No Porokeratosis. No infection or ulcers.    Diagnosis:  Onychomycosis, , Pain in right toe, pain in left toes  Treatment & Plan Procedures and Treatment: Consent by patient was obtained for treatment procedures. The patient understood the discussion of treatment and procedures well. All questions were answered thoroughly reviewed. Debridement of mycotic and hypertrophic toenails, 1 through 5 bilateral and clearing of subungual debris. No ulceration, no infection noted.    Return Visit-Office Procedure: Patient instructed to return to the office for a follow up visit 3 months for continued evaluation and treatment.    Makye Radle DPM 

## 2017-11-21 ENCOUNTER — Telehealth: Payer: Self-pay | Admitting: Adult Health

## 2017-11-21 NOTE — Telephone Encounter (Signed)
Pt has called for a refill on her gabapentin (NEURONTIN) 600 MG tablet and her clopidogrel (PLAVIX) 75 MG tablet Please send to  [facility] - [place]ia Buff, [place] - [address] [phone] (Phone) [phone] (Fax)

## 2017-11-22 MED ORDER — GABAPENTIN 600 MG PO TABS: 600.0000 mg | ORAL_TABLET | Freq: Three times a day (TID) | ORAL | 3 refills | Status: DC

## 2017-11-22 MED ORDER — CLOPIDOGREL BISULFATE 75 MG PO TABS: 37.5000 mg | ORAL_TABLET | Freq: Every day | ORAL | 3 refills | Status: DC

## 2017-11-22 NOTE — Addendum Note (Signed)
Addended by: Brandon Melnick on: 11/22/2017 08:48 AM   Modules accepted: Orders

## 2018-02-01 ENCOUNTER — Ambulatory Visit: Payer: Medicare Other | Admitting: Podiatry

## 2018-02-01 ENCOUNTER — Encounter: Payer: Self-pay | Admitting: Podiatry

## 2018-02-01 DIAGNOSIS — D689 Coagulation defect, unspecified: Secondary | ICD-10-CM

## 2018-02-01 DIAGNOSIS — M79676 Pain in unspecified toe(s): Secondary | ICD-10-CM | POA: Diagnosis not present

## 2018-02-01 DIAGNOSIS — B351 Tinea unguium: Secondary | ICD-10-CM | POA: Diagnosis not present

## 2018-02-01 NOTE — Progress Notes (Signed)
Complaint:  Visit Type: Patient returns to my office for continued preventative foot care services. Complaint: Patient states" my nails have grown long and thick and become painful to walk and wear shoes"  The patient presents for preventative foot care services. No changes to ROS.  Patient is taking plavix.  Podiatric Exam: Vascular: dorsalis pedis and posterior tibial pulses are palpable bilateral. Capillary return is immediate. Temperature gradient is WNL. Skin turgor WNL  Sensorium: Normal Semmes Weinstein monofilament test. Normal tactile sensation bilaterally. Nail Exam: Pt has thick disfigured discolored nails with subungual debris noted bilateral entire nail hallux through fifth toenails Ulcer Exam: There is no evidence of ulcer or pre-ulcerative changes or infection. Orthopedic Exam: Muscle tone and strength are WNL. No limitations in general ROM. No crepitus or effusions noted. Foot type and digits show no abnormalities. Forefoot equinus foot type with hammer toes 2-5  B/L Skin: No Porokeratosis. No infection or ulcers.    Diagnosis:  Onychomycosis, , Pain in right toe, pain in left toes  Treatment & Plan Procedures and Treatment: Consent by patient was obtained for treatment procedures. The patient understood the discussion of treatment and procedures well. All questions were answered thoroughly reviewed. Debridement of mycotic and hypertrophic toenails, 1 through 5 bilateral and clearing of subungual debris. No ulceration, no infection noted.    Return Visit-Office Procedure: Patient instructed to return to the office for a follow up visit 3 months for continued evaluation and treatment.    Gardiner Barefoot DPM

## 2018-02-22 ENCOUNTER — Ambulatory Visit: Payer: Medicare Other | Admitting: Podiatry

## 2018-02-26 ENCOUNTER — Other Ambulatory Visit: Payer: Self-pay | Admitting: Obstetrics and Gynecology

## 2018-02-26 DIAGNOSIS — Z1231 Encounter for screening mammogram for malignant neoplasm of breast: Secondary | ICD-10-CM

## 2018-03-16 ENCOUNTER — Encounter: Payer: Self-pay | Admitting: Obstetrics and Gynecology

## 2018-03-16 ENCOUNTER — Ambulatory Visit (INDEPENDENT_AMBULATORY_CARE_PROVIDER_SITE_OTHER): Payer: Medicare Other | Admitting: Obstetrics and Gynecology

## 2018-03-16 VITALS — BP 122/70 | HR 86 | Ht 68.0 in | Wt 230.0 lb

## 2018-03-16 DIAGNOSIS — Z01419 Encounter for gynecological examination (general) (routine) without abnormal findings: Secondary | ICD-10-CM | POA: Diagnosis not present

## 2018-03-16 DIAGNOSIS — Z1239 Encounter for other screening for malignant neoplasm of breast: Secondary | ICD-10-CM | POA: Diagnosis not present

## 2018-03-16 NOTE — Progress Notes (Signed)
Gynecology Annual Exam  PCP: Antony Contras, MD  Chief Complaint:  Chief Complaint  Patient presents with  . Gynecologic Exam    History of Present Illness:Patient is a 77 y.o. No obstetric history on file. presents for annual exam. The patient has no complaints today.   LMP: No LMP recorded. Patient has had a hysterectomy.  The patient is not sexually active. She does dyspareunia.  The patient does perform self breast exams.  There is notable family history of breast or ovarian cancer in her family.  The patient wears seatbelts: yes.   The patient has regular exercise: not asked.    The patient denies current symptoms of depression.     Review of Systems: Review of Systems  Constitutional: Negative for chills and fever.  HENT: Negative for congestion.   Respiratory: Negative for cough and shortness of breath.   Cardiovascular: Negative for chest pain and palpitations.  Gastrointestinal: Positive for diarrhea and nausea. Negative for abdominal pain, constipation, heartburn and vomiting.  Genitourinary: Negative for dysuria, frequency and urgency.  Musculoskeletal: Positive for back pain, joint pain and myalgias. Negative for falls and neck pain.  Skin: Negative for itching and rash.  Neurological: Negative for dizziness and headaches.  Endo/Heme/Allergies: Positive for environmental allergies. Negative for polydipsia. Bruises/bleeds easily.  Psychiatric/Behavioral: Negative for depression.    Past Medical History:  Past Medical History:  Diagnosis Date  . Anemia   . Anxiety and depression   . Arthritis, degenerative    scoliosis  . Atrial fibrillation ([place])    history of- pt. reports thats why she is on Plavix. But the afib is resolved.    . Cerebrovascular disease   . Chronic kidney disease   . Chronic low back pain   . Complication of anesthesia 1970's   post op- vertigo, odd sensation with " feeling like I was going to fall", also reports that the anesth. team  remarked that she had a quick" response to anesth.    . Diverticulosis   . Dyslipidemia   . Fibromyalgia   . GERD (gastroesophageal reflux disease)   . HTN (hypertension)   . IBS (irritable bowel syndrome)   . Mild mitral valve prolapse   . Obesity   . OSA (obstructive sleep apnea)    tried CPAP, then Bipap, now on concentrator  . Peripheral neuropathy    feet  . Peripheral vascular disease ([place])   . Positional vertigo   . Scoliosis    lumbar 5  . Spina bifida occulta   . Stroke [facility])    pt. reports that she had TIA's in the past & has been on Plavix ever since.   . Varicose vein     Past Surgical History:  Past Surgical History:  Procedure Laterality Date  . ABDOMINAL HYSTERECTOMY    . CARDIAC CATHETERIZATION  1990's   no blockage   . CHOLECYSTECTOMY    . ENDOVENOUS ABLATION SAPHENOUS VEIN W/ LASER  10/22/2007  . gallbladder resection    . MINOR HEMORRHOIDECTOMY    . TONSILLECTOMY    . TOTAL KNEE ARTHROPLASTY Left 11/28/2016   Procedure: TOTAL KNEE ARTHROPLASTY;  Surgeon: Frederik Pear, MD;  Location: [place];  Service: Orthopedics;  Laterality: Left;    Gynecologic History:  No LMP recorded. Patient has had a hysterectomy. Pap: 03/08/2017 NIL HPV negative  Obstetric History: No obstetric history on file.  Family History:  Family History  Problem Relation Age of Onset  . Cancer Mother   .  Hypertension Mother   . Varicose Veins Mother   . Heart attack Mother   . Heart attack Father   . Cancer Father   . Heart disease Father   . Hypertension Father   . Diabetes Sister   . Hypertension Sister   . Hyperlipidemia Sister   . Varicose Veins Sister   . Hypertension Brother   . Hyperlipidemia Brother   . Cerebrovascular Disease Maternal Grandmother   . Stroke Paternal Grandmother   . Hypertension Sister   . Hyperlipidemia Sister   . Varicose Veins Sister   . Carpal tunnel syndrome Sister     Social History:  Social History   Socioeconomic History  .  Marital status: Married    Spouse name: Not on file  . Number of children: 2  . Years of education: college 2  . Highest education level: Not on file  Occupational History  . Occupation: Retired  Scientific laboratory technician  . Financial resource strain: Not on file  . Food insecurity:    Worry: Not on file    Inability: Not on file  . Transportation needs:    Medical: Not on file    Non-medical: Not on file  Tobacco Use  . Smoking status: Never Smoker  . Smokeless tobacco: Never Used  Substance and Sexual Activity  . Alcohol use: No    Comment: on occasion  . Drug use: No  . Sexual activity: Not on file  Lifestyle  . Physical activity:    Days per week: Not on file    Minutes per session: Not on file  . Stress: Not on file  Relationships  . Social connections:    Talks on phone: Not on file    Gets together: Not on file    Attends religious service: Not on file    Active member of club or organization: Not on file    Attends meetings of clubs or organizations: Not on file    Relationship status: Not on file  . Intimate partner violence:    Fear of current or ex partner: Not on file    Emotionally abused: Not on file    Physically abused: Not on file    Forced sexual activity: Not on file  Other Topics Concern  . Not on file  Social History Narrative   Patient is right handed.   Patient does not drink caffeine.    Allergies:  Allergies  Allergen Reactions  . Iodine Hives    IV CONTRAST  . Neomycin Hives and Rash  . Tape Hives and Rash    Medications: Prior to Admission medications   Medication Sig Start Date End Date Taking? Authorizing Provider  acetaminophen (TYLENOL) 500 MG tablet Take 500 mg by mouth.    [provider]  aspirin EC 81 MG tablet Take 81 mg by mouth daily with supper.    [provider]  carisoprodol (SOMA) 350 MG tablet Take 350 mg by mouth. Take 1/2 - 1 as needed    [provider]  celecoxib (CELEBREX) 200 MG capsule Take  200 mg by mouth daily with lunch.  10/04/13   [provider]  cetirizine (ZYRTEC) 10 MG tablet Take 10 mg by mouth at bedtime.    [provider]  clopidogrel (PLAVIX) 75 MG tablet Take 0.5 tablets (37.5 mg total) by mouth daily. 11/22/17   Ward Givens, NP  dicyclomine (BENTYL) 10 MG capsule  10/04/13   [provider]  fluticasone (FLONASE) 50 MCG/ACT nasal  spray Place 2 sprays into both nostrils at bedtime.  08/16/13   [provider]  gabapentin (NEURONTIN) 600 MG tablet Take 1 tablet (600 mg total) by mouth 3 (three) times daily. 11/22/17   Ward Givens, NP  hydrochlorothiazide (HYDRODIURIL) 12.5 MG tablet Take 12.5 mg by mouth daily with lunch.  07/18/13   [provider]  Hydrocortisone (CHYIFOYDX-41 EX) Apply 1 application topically 2 (two) times daily as needed (for insect bites/itchy skin).    [provider]  lisinopril (PRINIVIL,ZESTRIL) 5 MG tablet Take 2.5 mg by mouth daily with lunch.  07/17/13   [provider]  Multiple Vitamin (MULTIVITAMIN WITH MINERALS) TABS tablet Take 1 tablet by mouth daily with supper. [place] 50+    [provider]  pantoprazole (PROTONIX) 40 MG tablet Take 40 mg by mouth daily before breakfast. 30 MINS TO 1 HOUR BEFORE EATING. 07/26/13   [provider]  Probiotic Product (ALIGN) 4 MG CAPS Take 4 mg by mouth daily with lunch.    [provider]  sertraline (ZOLOFT) 100 MG tablet  10/17/13   [provider]  Simethicone (PHAZYME) 180 MG CAPS Take 180 mg by mouth 2 (two) times daily as needed (FOR BLOATING/GAS).    [provider]  simvastatin (ZOCOR) 20 MG tablet Take 20 mg by mouth every evening.  09/03/13   [provider]  tetrahydrozoline-zinc (VISINE-AC) 0.05-0.25 % ophthalmic solution Place 2 drops into both eyes 3 (three) times daily as needed (for redness/itchy eyes.).    [provider]  Wheat Dextrin (BENEFIBER) POWD Take 14.2 g  by mouth 2 (two) times daily.     [provider]    Physical Exam Vitals: Blood pressure 122/70, pulse 86, height 5\' 8"  (1.727 m), weight 230 lb (104.3 kg).   General: NAD HEENT: normocephalic, anicteric Thyroid: no enlargement, no palpable nodules Pulmonary: No increased work of breathing, CTAB Cardiovascular: RRR, distal pulses 2+ Breast: Breast symmetrical, no tenderness, no palpable nodules or masses, no skin or nipple retraction present, no nipple discharge.  No axillary or supraclavicular lymphadenopathy. Abdomen: NABS, soft, non-tender, non-distended.  Umbilicus without lesions.  No hepatomegaly, splenomegaly or masses palpable. No evidence of hernia  Genitourinary:  External: Normal external female genitalia.  Moderate urethral prolapse, normal Bartholin's and Skene's glands.    Vagina: Normal vaginal mucosa, no evidence of prolapse.    Cervix: surgically absent  Uterus: surgically absent  Adnexa: ovaries non-enlarged, no adnexal masses  Rectal: deferred  Lymphatic: no evidence of inguinal lymphadenopathy Extremities: no edema, erythema, or tenderness Neurologic: Grossly intact Psychiatric: mood appropriate, affect full  Female chaperone present for pelvic and breast  portions of the physical exam     Assessment: 77 y.o. No obstetric history on file. routine annual exam  Plan: Problem List Items Addressed This Visit    None    Visit Diagnoses    Encounter for gynecological examination without abnormal finding    -  Primary   Breast screening       Relevant Orders   MM 3D SCREEN BREAST BILATERAL      1) Mammogram - recommend yearly screening mammogram.  Mammogram Was ordered today  2) STI screening  was notoffered and therefore not obtained  3) ASCCP guidelines and rational discussed.  Patient opts for discontinue secondary to prior hysterectomy screening interval.  Patient opts to still continuing screening.  Will obtain repeat pap next year given  medicaid only covers q2year paps  4) Osteoporosis  - Dexa  scan 08/29/2017 Femur neck 0.932g/cm^2 with T-score of -0.8.   - FRAX 10 year major fracture risk 9.9%,  10 year hip fracture risk 1.6%  Consider FDA-approved medical therapies in postmenopausal women and men aged 12 years and older, based on the following: a) A hip or vertebral (clinical or morphometric) fracture b) T-score ? -2.5 at the femoral neck or spine after appropriate evaluation to exclude secondary causes C) Low bone mass (T-score between -1.0 and -2.5 at the femoral neck or spine) and a 10-year probability of a hip fracture ? 3% or a 10-year probability of a major osteoporosis-related fracture ? 20% based on the [place]-adapted WHO algorithm  5) Routine healthcare maintenance including cholesterol, diabetes screening discussed managed by PCP  6) Colonoscopy   - 04/13/2009 repeat in 5 years reports having repeat in 2017  7) Return in about 1 year (around 03/17/2019) for annual.    Malachy Mood, MD [place], [facility] 03/16/2018, 3:02 PM

## 2018-03-16 NOTE — Patient Instructions (Signed)
[facility] [address] [place] [address]  [facility] [place]  [address] [place] [address]  Phone: [phone]  

## 2018-03-29 ENCOUNTER — Ambulatory Visit
Admission: RE | Admit: 2018-03-29 | Discharge: 2018-03-29 | Disposition: A | Discharge status: Home / Self Care | Payer: Medicare Other | Source: Ambulatory Visit | Attending: Obstetrics and Gynecology | Admitting: Obstetrics and Gynecology

## 2018-03-29 DIAGNOSIS — Z1231 Encounter for screening mammogram for malignant neoplasm of breast: Secondary | ICD-10-CM | POA: Diagnosis not present

## 2018-04-09 ENCOUNTER — Encounter: Payer: Self-pay | Admitting: Podiatry

## 2018-04-09 ENCOUNTER — Ambulatory Visit (INDEPENDENT_AMBULATORY_CARE_PROVIDER_SITE_OTHER): Payer: Medicare Other | Admitting: Podiatry

## 2018-04-09 DIAGNOSIS — D689 Coagulation defect, unspecified: Secondary | ICD-10-CM

## 2018-04-09 DIAGNOSIS — B351 Tinea unguium: Secondary | ICD-10-CM

## 2018-04-09 DIAGNOSIS — M79676 Pain in unspecified toe(s): Secondary | ICD-10-CM

## 2018-04-09 NOTE — Progress Notes (Signed)
Complaint:  Visit Type: Patient returns to my office for continued preventative foot care services. Complaint: Patient states" my nails have grown long and thick and become painful to walk and wear shoes"  The patient presents for preventative foot care services. No changes to ROS.  Patient is taking plavix.  Podiatric Exam: Vascular: dorsalis pedis and posterior tibial pulses are palpable bilateral. Capillary return is immediate. Temperature gradient is WNL. Skin turgor WNL  Sensorium: Normal Semmes Weinstein monofilament test. Normal tactile sensation bilaterally. Nail Exam: Pt has thick disfigured discolored nails with subungual debris noted bilateral entire nail hallux through fifth toenails except hallux nails  B/L Ulcer Exam: There is no evidence of ulcer or pre-ulcerative changes or infection. Orthopedic Exam: Muscle tone and strength are WNL. No limitations in general ROM. No crepitus or effusions noted. Foot type and digits show no abnormalities. Forefoot equinus foot type with hammer toes 2-5  B/L Skin: No Porokeratosis. No infection or ulcers.    Diagnosis:  Onychomycosis, , Pain in right toe, pain in left toes  Treatment & Plan Procedures and Treatment: Consent by patient was obtained for treatment procedures. The patient understood the discussion of treatment and procedures well. All questions were answered thoroughly reviewed. Debridement of mycotic and hypertrophic toenails, 1 through 5 bilateral and clearing of subungual debris. No ulceration, no infection noted.    Return Visit-Office Procedure: Patient instructed to return to the office for a follow up visit 3 months for continued evaluation and treatment.    Gardiner Barefoot DPM

## 2018-06-11 ENCOUNTER — Encounter: Payer: Self-pay | Admitting: Podiatry

## 2018-06-11 ENCOUNTER — Ambulatory Visit: Payer: Medicare Other | Admitting: Podiatry

## 2018-06-11 DIAGNOSIS — B351 Tinea unguium: Secondary | ICD-10-CM

## 2018-06-11 DIAGNOSIS — M79676 Pain in unspecified toe(s): Secondary | ICD-10-CM | POA: Diagnosis not present

## 2018-06-11 DIAGNOSIS — D689 Coagulation defect, unspecified: Secondary | ICD-10-CM | POA: Diagnosis not present

## 2018-06-11 NOTE — Progress Notes (Signed)
Complaint:  Visit Type: Patient returns to my office for continued preventative foot care services. Complaint: Patient states" my nails have grown long and thick and become painful to walk and wear shoes"  The patient presents for preventative foot care services. No changes to ROS.  Patient is taking plavix.  Podiatric Exam: Vascular: dorsalis pedis and posterior tibial pulses are palpable bilateral. Capillary return is immediate. Temperature gradient is WNL. Skin turgor WNL  Sensorium: Normal Semmes Weinstein monofilament test. Normal tactile sensation bilaterally. Nail Exam: Pt has thick disfigured discolored nails with subungual debris noted bilateral entire nail hallux through fifth toenails except hallux nails  B/L Ulcer Exam: There is no evidence of ulcer or pre-ulcerative changes or infection. Orthopedic Exam: Muscle tone and strength are WNL. No limitations in general ROM. No crepitus or effusions noted. Foot type and digits show no abnormalities. Forefoot equinus foot type with hammer toes 2-5  B/L Skin: No Porokeratosis. No infection or ulcers.    Diagnosis:  Onychomycosis, , Pain in right toe, pain in left toes  Treatment & Plan Procedures and Treatment: Consent by patient was obtained for treatment procedures. The patient understood the discussion of treatment and procedures well. All questions were answered thoroughly reviewed. Debridement of mycotic and hypertrophic toenails, 1 through 5 bilateral and clearing of subungual debris. No ulceration, no infection noted.    Return Visit-Office Procedure: Patient instructed to return to the office for a follow up visit 9 weeks  for continued evaluation and treatment.    Gardiner Barefoot DPM

## 2018-06-18 ENCOUNTER — Telehealth: Payer: Self-pay

## 2018-06-18 NOTE — Telephone Encounter (Signed)
Received form from [place] requesting if it would be ok for the pt to hold plavix 7 days before selective root nerve block. Dr. [name] signed ok to hold Plavix per request and form faxed back to # [phone]. Confirmation received.

## 2018-07-23 ENCOUNTER — Encounter: Payer: Self-pay | Admitting: Cardiovascular Disease

## 2018-07-23 NOTE — Progress Notes (Signed)
Cardiology Office Note  Date:  07/24/2018   ID:  Samantha Woodard, DOB: 22-Jan-1941, MRN: [id number]  PCP:  Antony Contras, MD   Chief Complaint  Patient presents with  . other    Chest pain with exertion. Meds reviewed verbally with pt.    HPI:  Samantha Woodard is a 78 y.o. female with a PMHx of: Paroxysmal tachycardia Anemia CKD HTN Dyslipidemia Varicose veins of lower extremities  TIA, on aspirin Plavix GERD Spina bifida occulta, Scoliosis  Mild mitral valve prolapse OSA Left knee replacement Obesity Degenerative Arthiritis Family history of heart disease.  Non smoker Referred by Dr. Antony Contras [facility] [place]) for exertional chest pain  INTERVAL HISTORY: The patient reports today for an initial consultation.   About one month ago she experienced mid sternal chest pain that radiates across her chest to her arms and neck. Adding associated SOB. This was not the first time she has experienced these symptoms. Typically they occur after eating, while she is moving around, this time she was in her kitchen. Samantha Woodard adds her first episode was about 9 years ago, stating she may go a year or two in between.   She is not sure if her pain is something to be worried about or if it is related to her hiatal hernia, GERD or scoliosis. Normally when her pain is related to her scoliosis, she feels more pain around her spine.   She regularly walks around her neighborhood, adding depending on how she is feeling and how her left knee is doing, she may challenge herself by walking up inclines in her neighborhood verses walking on more level ground. Adding she has three different hills she can walk up.   She hasn't noticed any tachycardic symptoms recently, reporting at times the flutter she is experiencing due to gas.   Last stress test was about 5 years ago.  Last TIA was in 1999 and she is on aspirin and Plavix since.  She does not remember the doctor's name, or when it was  done, but she had a left carotid doppler down, showing very slight bruits.  Mylanta is the only thing that helps when she is experiencing acid reflux.   At this time she would like to wait on NTG until after getting the results of the lexiscan myoview.   Due to her father's history of heart disease she would like to rule out any severe issues.   Today's Blood pressure 114/75 Total Chol 166/ LDL 78  CR 1.08 Glucose 87  EKG personally reviewed by myself on todays visit Shows Normal sinus rhythm. 78 bpm. Low voltage QRS   OTHER PAST MEDICAL HISTORY REVIEWED BY ME FOR TODAY'S VISIT:   PMH:   has a past medical history of Anemia, Anxiety and depression, Arthritis, degenerative, Atrial fibrillation ([place]), Cerebrovascular disease, Chronic kidney disease, Chronic low back pain, Complication of anesthesia (1970's), Diverticulosis, Dyslipidemia, Fibromyalgia, GERD (gastroesophageal reflux disease), HTN (hypertension), IBS (irritable bowel syndrome), Mild mitral valve prolapse, Obesity, OSA (obstructive sleep apnea), Peripheral neuropathy, Peripheral vascular disease ([place]), Positional vertigo, Scoliosis, Spina bifida occulta, Stroke ([place]), and Varicose vein.  PSH:    Past Surgical History:  Procedure Laterality Date  . ABDOMINAL HYSTERECTOMY    . CARDIAC CATHETERIZATION  1990's   no blockage   . CHOLECYSTECTOMY    . ENDOVENOUS ABLATION SAPHENOUS VEIN W/ LASER  10/22/2007  . gallbladder resection    . MINOR HEMORRHOIDECTOMY    . TONSILLECTOMY    . TOTAL KNEE  ARTHROPLASTY Left 11/28/2016   Procedure: TOTAL KNEE ARTHROPLASTY;  Surgeon: Frederik Pear, MD;  Location: [place];  Service: Orthopedics;  Laterality: Left;    Current Outpatient Medications  Medication Sig Dispense Refill  . acetaminophen (TYLENOL) 500 MG tablet Take 500 mg by mouth every 6 (six) hours as needed.     Marland Kitchen aspirin EC 81 MG tablet Take 81 mg by mouth daily with supper.    . carisoprodol (SOMA) 350 MG tablet Take 350 mg by  mouth. Take 1/2 - 1 as needed    . celecoxib (CELEBREX) 200 MG capsule Take 200 mg by mouth daily with lunch.     . cetirizine (ZYRTEC) 10 MG tablet Take 10 mg by mouth at bedtime.    . clopidogrel (PLAVIX) 75 MG tablet Take 0.5 tablets (37.5 mg total) by mouth daily. 45 tablet 3  . fluticasone (FLONASE) 50 MCG/ACT nasal spray Place 2 sprays into both nostrils at bedtime.     . gabapentin (NEURONTIN) 600 MG tablet Take 1 tablet (600 mg total) by mouth 3 (three) times daily. 270 tablet 3  . hydrochlorothiazide (HYDRODIURIL) 12.5 MG tablet Take 12.5 mg by mouth daily with lunch.     . Hydrocortisone (DXAJOINOM-76 EX) Apply 1 application topically 2 (two) times daily as needed (for insect bites/itchy skin).    Marland Kitchen lisinopril (PRINIVIL,ZESTRIL) 5 MG tablet Take 2.5 mg by mouth daily with lunch.     . Multiple Vitamin (MULTIVITAMIN WITH MINERALS) TABS tablet Take 1 tablet by mouth daily with supper. WOMEN'S 50+    . pantoprazole (PROTONIX) 40 MG tablet Take 40 mg by mouth daily before breakfast. 30 MINS TO 1 HOUR BEFORE EATING.    . Probiotic Product (ALIGN) 4 MG CAPS Take 4 mg by mouth daily with lunch.    . Simethicone (PHAZYME) 180 MG CAPS Take 180 mg by mouth 2 (two) times daily as needed (FOR BLOATING/GAS).    Marland Kitchen simvastatin (ZOCOR) 20 MG tablet Take 20 mg by mouth every evening.     Marland Kitchen tetrahydrozoline-zinc (VISINE-AC) 0.05-0.25 % ophthalmic solution Place 2 drops into both eyes 3 (three) times daily as needed (for redness/itchy eyes.).    Marland Kitchen Wheat Dextrin (BENEFIBER) POWD Take 14.2 g by mouth 2 (two) times daily.      No current facility-administered medications for this visit.      ALLERGIES:   Iodine; Neomycin; and Tape   SOCIAL HISTORY:  The patient  reports that she has never smoked. She has never used smokeless tobacco. She reports that she does not drink alcohol or use drugs.   FAMILY HISTORY:   family history includes Cancer in her father and mother; Carpal tunnel syndrome in her sister;  Cerebrovascular Disease in her maternal grandmother; Diabetes in her sister; Heart attack in her father and mother; Heart disease in her father; Hyperlipidemia in her brother, sister, and sister; Hypertension in her brother, father, mother, sister, and sister; Stroke in her paternal grandmother; Varicose Veins in her mother, sister, and sister.    REVIEW OF SYSTEMS: Review of Systems  Constitutional: Negative.   Eyes: Negative.   Respiratory: Positive for shortness of breath.   Cardiovascular: Positive for chest pain.  Gastrointestinal: Negative.   Genitourinary: Negative.   Musculoskeletal: Negative.   Neurological: Negative.   Psychiatric/Behavioral: Negative.   All other systems reviewed and are negative.    PHYSICAL EXAM: VS:  BP 114/75 (BP Location: Right Arm, Patient Position: Sitting, Cuff Size: Large)   Pulse 78   Ht  5' 7.5" (1.715 m)   Wt 234 lb (106.1 kg)   BMI 36.11 kg/m  , BMI Body mass index is 36.11 kg/m.  GEN: Well nourished, well developed, in no acute distress, obese HEENT: normal Neck: no JVD, carotid bruits, or masses Cardiac: RRR; 1/6 aortic valve murmur, rubs, or gallops, no edema  Respiratory:  clear to auscultation bilaterally, normal work of breathing GI: soft, nontender, nondistended, + BS MS: no deformity or atrophy Skin: warm and dry, no rash Neuro:  Strength and sensation are intact Psych: euthymic mood, full affect  RECENT LABS: No results found for requested labs within last 8760 hours.    LIPID PANEL: No results found for: CHOL, HDL, LDLCALC, TRIG    WEIGHT: Wt Readings from Last 3 Encounters:  07/24/18 234 lb (106.1 kg)  03/16/18 230 lb (104.3 kg)  10/16/17 231 lb (104.8 kg)       ASSESSMENT AND PLAN:  Chest pain with moderate risk for cardiac etiology  Plan: EKG 12-Lead Some atypical features, present at rest while she was in her kitchen Otherwise has been exerting herself without reproducible chest pain symptoms She is  concerned about strong family history of coronary disease, father She does have several risk factors including hyperlipidemia Unable to treadmill given chronic knee pain back pain Lexiscan Myoview has been ordered for anginal pain If this is low risk, no further testing needed  Morbid obesity We have encouraged continued exercise, careful diet management in an effort to lose weight.  Hyperlipidemia Currently on a statin, numbers relatively well controlled  TIAs Details unavailable, on aspirin Plavix Reports having minimal carotid disease on prior study  Stage II chronic kidney disease Recommend she stay hydrated  GERD Reports having GERD and hiatal hernia symptoms On PPI  Chronic back pain Thoracic midline, if symptoms get worse recommend she follow-up with primary care   Disposition:   F/U  PRN  Long discussion with her concerning differential for back pain, chest pain Discussed chronic kidney disease, causes, treatment Discussed lab work in detail Total encounter time more than 60 minutes. Greater than 50% was spent in counseling and coordination of care with the patient.    Orders Placed This Encounter  Procedures  . NM Myocar Multi W/Spect W/Wall Motion / EF  . EKG 12-Lead    Margit Banda is acting as a scribe for Ida Rogue, M.D., Ph.D.   I have reviewed the above documentation for accuracy and completeness, and I agree with the above.   Signed, Esmond Plants, M.D., Ph.D. 07/24/2018  [place], [place]

## 2018-07-24 ENCOUNTER — Encounter: Payer: Self-pay | Admitting: Cardiovascular Disease

## 2018-07-24 ENCOUNTER — Ambulatory Visit: Payer: Medicare Other | Admitting: Cardiovascular Disease

## 2018-07-24 VITALS — BP 114/75 | HR 78 | Ht 67.5 in | Wt 234.0 lb

## 2018-07-24 DIAGNOSIS — G8929 Other chronic pain: Secondary | ICD-10-CM

## 2018-07-24 DIAGNOSIS — R079 Chest pain, unspecified: Secondary | ICD-10-CM | POA: Diagnosis not present

## 2018-07-24 DIAGNOSIS — N182 Chronic kidney disease, stage 2 (mild): Secondary | ICD-10-CM

## 2018-07-24 DIAGNOSIS — G459 Transient cerebral ischemic attack, unspecified: Secondary | ICD-10-CM

## 2018-07-24 DIAGNOSIS — E782 Mixed hyperlipidemia: Secondary | ICD-10-CM | POA: Diagnosis not present

## 2018-07-24 DIAGNOSIS — K219 Gastro-esophageal reflux disease without esophagitis: Secondary | ICD-10-CM

## 2018-07-24 DIAGNOSIS — K449 Diaphragmatic hernia without obstruction or gangrene: Secondary | ICD-10-CM

## 2018-07-24 DIAGNOSIS — M546 Pain in thoracic spine: Secondary | ICD-10-CM

## 2018-07-24 NOTE — Patient Instructions (Addendum)
Medication Instructions:  No changes  If you need a refill on your cardiac medications before your next appointment, please call your pharmacy.    Lab work: No new labs needed   If you have labs (blood work) drawn today and your tests are completely normal, you will receive your results only by: Marland Kitchen MyChart Message (if you have MyChart) OR . A paper copy in the mail If you have any lab test that is abnormal or we need to change your treatment, we will call you to review the results.   Testing/Procedures:  We will schedule a lexiscan myoview for anginal pain [place] caregiver has ordered a Stress Test with nuclear imaging. The purpose of this test is to evaluate the blood supply to your heart muscle. This procedure is referred to as a "Non-Invasive Stress Test." This is because other than having an IV started in your vein, nothing is inserted or "invades" your body. Cardiac stress tests are done to find areas of poor blood flow to the heart by determining the extent of coronary artery disease (CAD). Some patients exercise on a treadmill, which naturally increases the blood flow to your heart, while others who are  unable to walk on a treadmill due to physical limitations have a pharmacologic/chemical stress agent called Lexiscan . This medicine will mimic walking on a treadmill by temporarily increasing your coronary blood flow.   Please note: these test may take anywhere between 2-4 hours to complete  PLEASE REPORT TO [facility] AT THE FIRST DESK WILL DIRECT YOU WHERE TO GO  Date of Procedure:_____________________________________  Arrival Time for Procedure:______________________________    PLEASE NOTIFY THE OFFICE AT LEAST 24 HOURS IN ADVANCE IF YOU ARE UNABLE TO KEEP YOUR APPOINTMENT.  [phone] AND  PLEASE NOTIFY NUCLEAR MEDICINE AT [facility] AT LEAST 24 HOURS IN ADVANCE IF YOU ARE UNABLE TO KEEP YOUR APPOINTMENT. [phone]  How to  prepare for your Myoview test:  1. Do not eat or drink after midnight 2. No caffeine for 24 hours prior to test 3. No smoking 24 hours prior to test. 4. Your medication may be taken with water.  If your doctor stopped a medication because of this test, do not take that medication. 5. Ladies, please do not wear dresses.  Skirts or pants are appropriate. Please wear a short sleeve shirt. 6. No perfume, cologne or lotion. 7. Wear comfortable walking shoes. No heels!   Follow-Up: At [facility], you and your health needs are our priority.  As part of our continuing mission to provide you with exceptional heart care, we have created designated Provider Care Teams.  These Care Teams include your primary Cardiologist (physician) and Advanced Practice Providers (APPs -  Physician Assistants and Nurse Practitioners) who all work together to provide you with the care you need, when you need it.  . You will need a follow up appointment as needed  . Providers on your designated Care Team:   . Murray Hodgkins, NP . Christell Faith, PA-C . Marrianne Mood, PA-C  Any Other Special Instructions Will Be Listed Below (If Applicable).  For educational health videos Log in to : [email or web address] Or : [email or web address], password : [email or web address]

## 2018-08-01 ENCOUNTER — Ambulatory Visit
Admission: RE | Admit: 2018-08-01 | Discharge: 2018-08-01 | Disposition: A | Discharge status: Home / Self Care | Payer: Medicare Other | Source: Ambulatory Visit | Attending: Cardiovascular Disease | Admitting: Cardiovascular Disease

## 2018-08-01 DIAGNOSIS — R079 Chest pain, unspecified: Secondary | ICD-10-CM | POA: Diagnosis not present

## 2018-08-01 LAB — NM MYOCAR MULTI W/SPECT W/WALL MOTION / EF
CHL CUP NUCLEAR SSS: 7
CHL CUP RESTING HR STRESS: 62 {beats}/min
CSEPED: 0 min
CSEPEDS: 0 s
CSEPHR: 57 %
CSEPPHR: 81 {beats}/min
Estimated workload: 1 METS
LV dias vol: 85 mL (ref 46–106)
LVSYSVOL: 29 mL
MPHR: 140 {beats}/min
SDS: 0
SRS: 9
TID: 0.95

## 2018-08-01 MED ORDER — TECHNETIUM TC 99M TETROFOSMIN IV KIT
10.2890 | PACK | Freq: Once | INTRAVENOUS | Status: AC | PRN
  Administered 2018-08-01: 10.289 via INTRAVENOUS

## 2018-08-01 MED ORDER — TECHNETIUM TC 99M TETROFOSMIN IV KIT
29.8500 | PACK | Freq: Once | INTRAVENOUS | Status: AC | PRN
  Administered 2018-08-01: 29.85 via INTRAVENOUS

## 2018-08-01 MED ORDER — REGADENOSON 0.4 MG/5ML IV SOLN
0.4000 mg | Freq: Once | INTRAVENOUS | Status: AC
  Administered 2018-08-01: 0.4 mg via INTRAVENOUS

## 2018-08-06 ENCOUNTER — Telehealth: Payer: Self-pay | Admitting: *Deleted

## 2018-08-06 NOTE — Telephone Encounter (Signed)
-----   Message from Minna Merritts, MD sent at 08/05/2018 12:36 PM EST ----- Did she get these results? Low risk myoview

## 2018-08-06 NOTE — Telephone Encounter (Signed)
Left voicemail message to call back for results.  

## 2018-08-07 ENCOUNTER — Other Ambulatory Visit: Payer: Medicare Other

## 2018-08-07 NOTE — Telephone Encounter (Signed)
Patient verbalized understanding of results and plan of care to follow up on an as needed basis. She was very Patent attorney.

## 2018-08-07 NOTE — Telephone Encounter (Signed)
Patient returning call  Patient gave verbal consent to give results to spouse Samantha Woodard

## 2018-08-13 ENCOUNTER — Ambulatory Visit: Payer: Medicare Other | Admitting: Podiatry

## 2018-08-13 ENCOUNTER — Encounter: Payer: Self-pay | Admitting: Podiatry

## 2018-08-13 DIAGNOSIS — M79676 Pain in unspecified toe(s): Secondary | ICD-10-CM | POA: Diagnosis not present

## 2018-08-13 DIAGNOSIS — D689 Coagulation defect, unspecified: Secondary | ICD-10-CM | POA: Diagnosis not present

## 2018-08-13 DIAGNOSIS — B351 Tinea unguium: Secondary | ICD-10-CM

## 2018-08-13 NOTE — Progress Notes (Signed)
Complaint:  Visit Type: Patient returns to my office for continued preventative foot care services. Complaint: Patient states" my nails have grown long and thick and become painful to walk and wear shoes"  The patient presents for preventative foot care services. No changes to ROS.  Patient is taking plavix.  Podiatric Exam: Vascular: dorsalis pedis and posterior tibial pulses are palpable bilateral. Capillary return is immediate. Temperature gradient is WNL. Skin turgor WNL  Sensorium: Normal Semmes Weinstein monofilament test. Normal tactile sensation bilaterally. Nail Exam: Pt has thick disfigured discolored nails with subungual debris noted bilateral entire nail hallux through fifth toenails except hallux nails  B/L Ulcer Exam: There is no evidence of ulcer or pre-ulcerative changes or infection. Orthopedic Exam: Muscle tone and strength are WNL. No limitations in general ROM. No crepitus or effusions noted. Foot type and digits show no abnormalities. Forefoot equinus foot type with hammer toes 2-5  B/L Skin: No Porokeratosis. No infection or ulcers.    Diagnosis:  Onychomycosis, , Pain in right toe, pain in left toes  Treatment & Plan Procedures and Treatment: Consent by patient was obtained for treatment procedures. The patient understood the discussion of treatment and procedures well. All questions were answered thoroughly reviewed. Debridement of mycotic and hypertrophic toenails, 2 through 5 bilateral and clearing of subungual debris. No ulceration, no infection noted.    Return Visit-Office Procedure: Patient instructed to return to the office for a follow up visit 9 weeks  for continued evaluation and treatment.    Alva Kuenzel DPM 

## 2018-09-03 IMAGING — CR DG CHEST 2V
2 series · 2 of 2 positions shown · non-contrast
Comparison: None in PACs

CLINICAL DATA: Preoperative examination prior to left total knee
arthroplasty

EXAM:
CHEST  2 VIEW

[w chest pa]
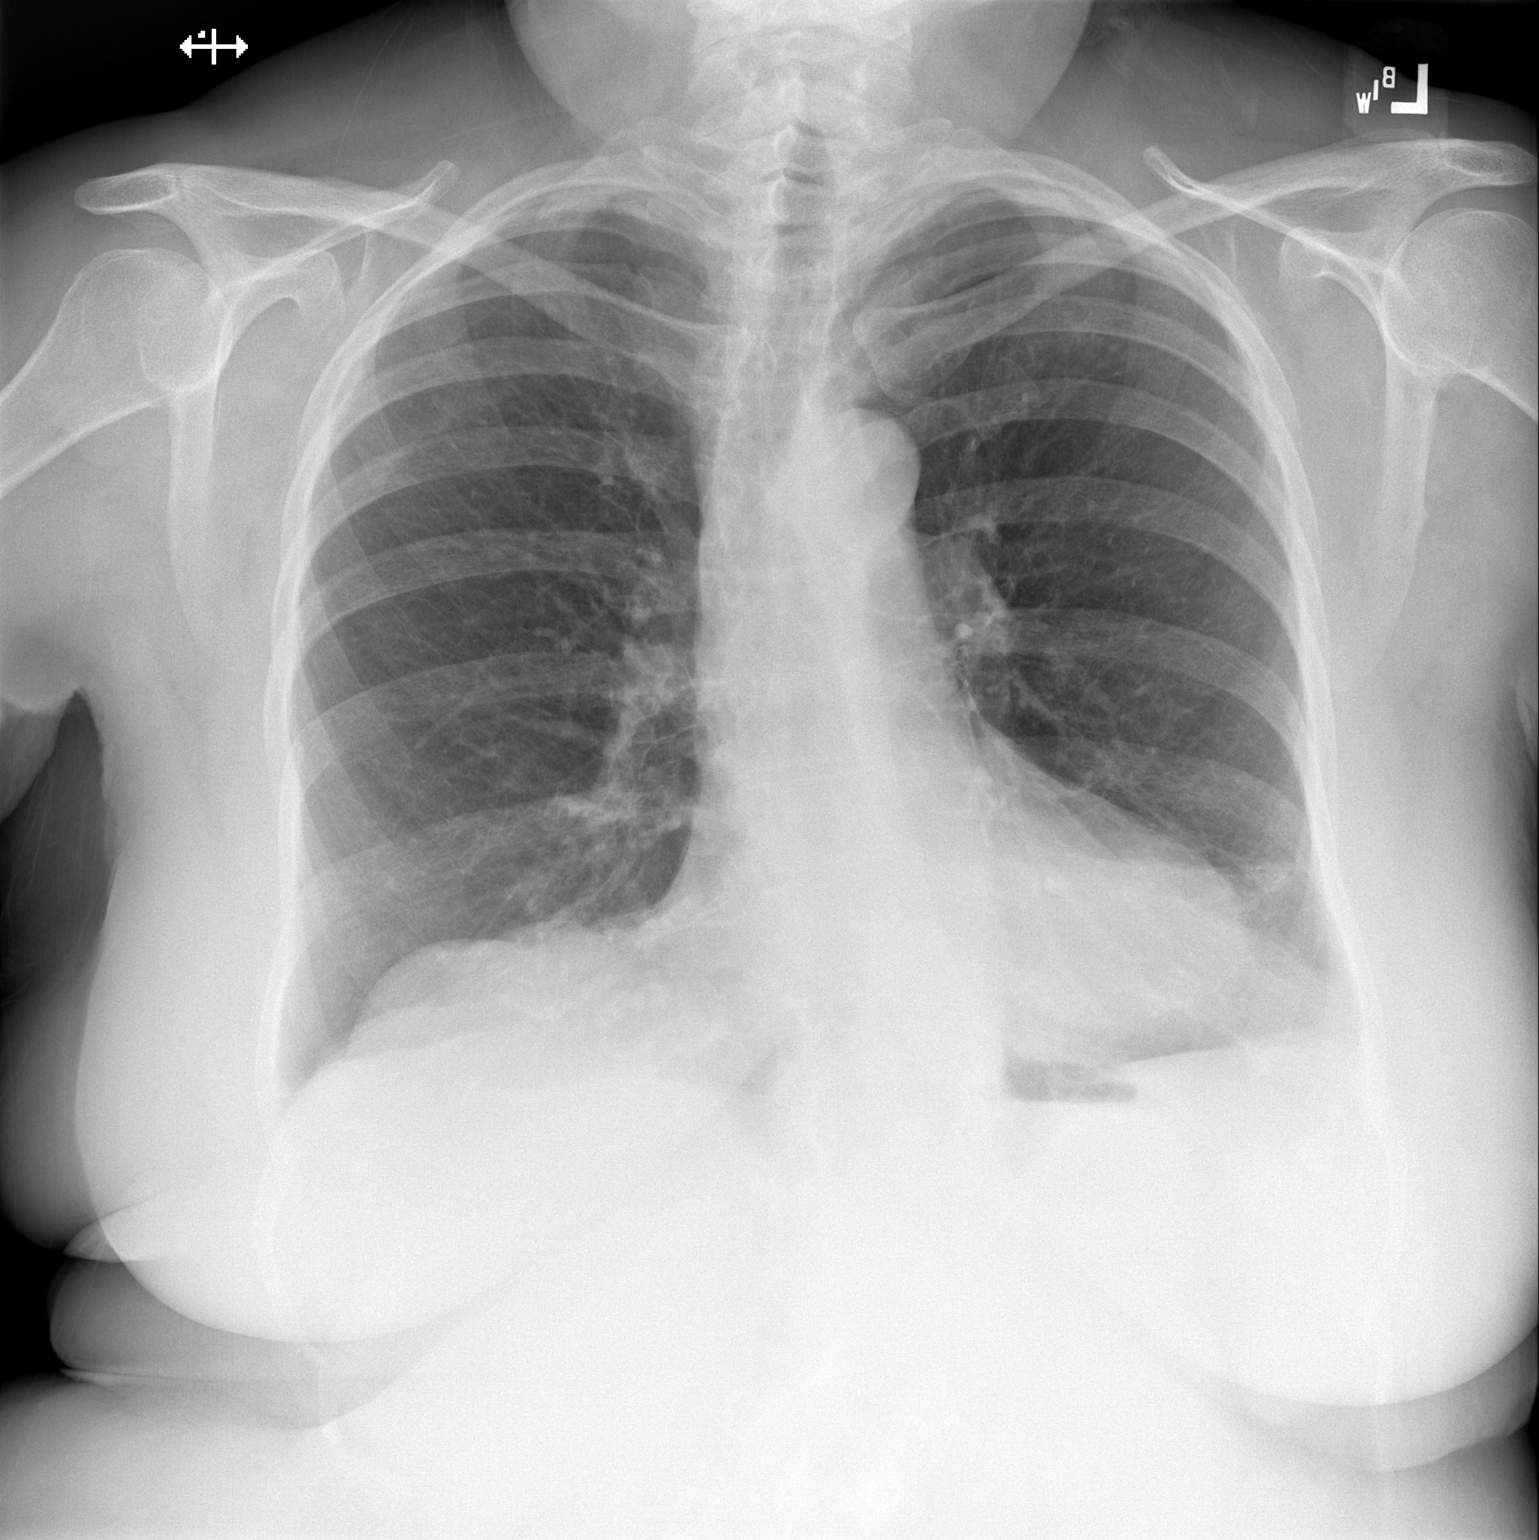

[w chest lat]
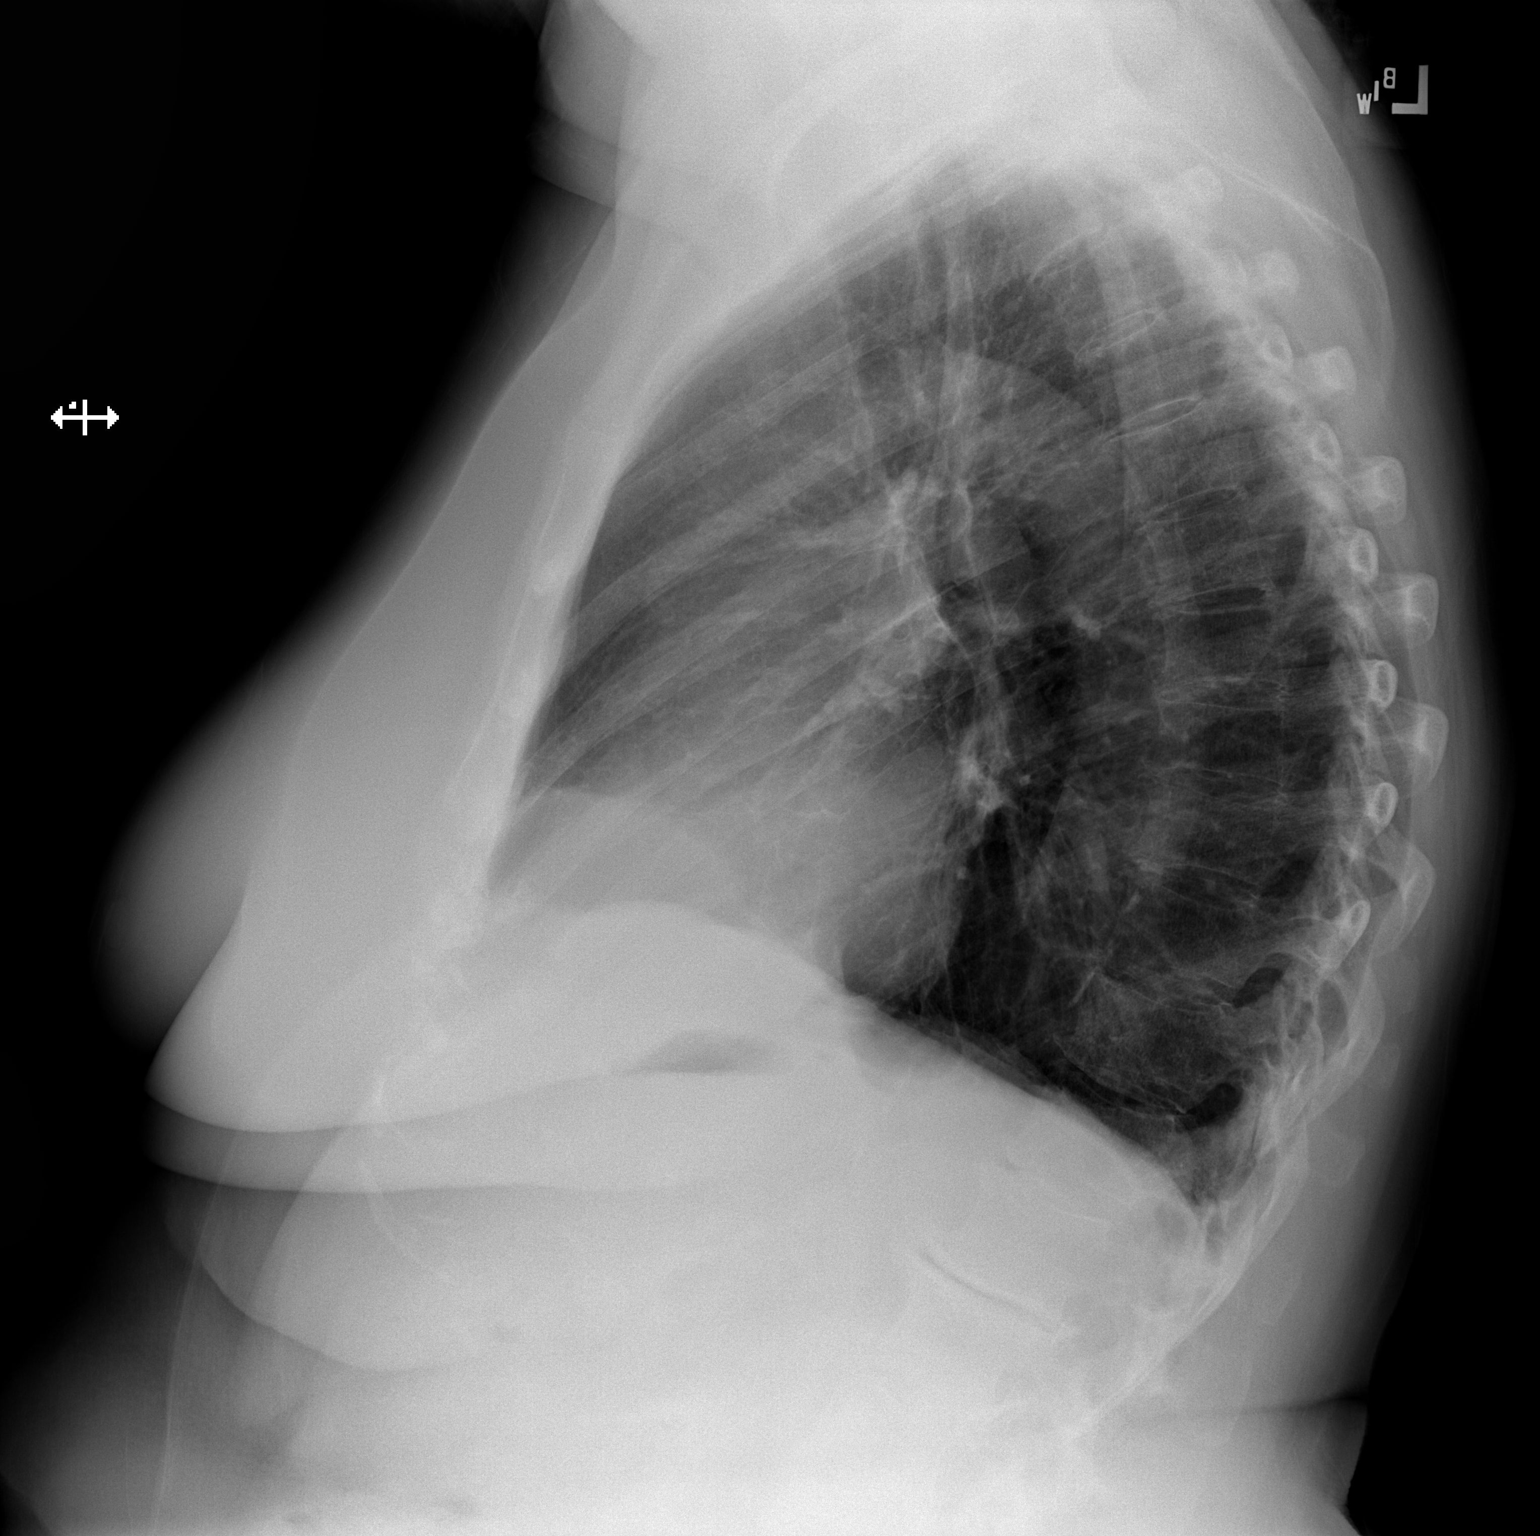

[2 of 2 positions shown; findings below may reference images not displayed]

FINDINGS: The right lung is adequately inflated. There is no focal infiltrate.
There are coarse lung markings in the lingula. The heart and
pulmonary vascularity are normal. The mediastinum is normal in
width. There is no pleural effusion. The bony thorax exhibits no
acute abnormality.
IMPRESSION: There is no acute cardiopulmonary abnormality.

## 2018-10-15 ENCOUNTER — Ambulatory Visit: Payer: Medicare Other | Admitting: Podiatry

## 2018-10-22 ENCOUNTER — Ambulatory Visit: Payer: Medicare Other | Admitting: Adult Health

## 2018-10-26 ENCOUNTER — Telehealth: Payer: Self-pay | Admitting: Diagnostic Neuroimaging

## 2018-10-26 MED ORDER — CLOPIDOGREL BISULFATE 75 MG PO TABS: 37.5000 mg | ORAL_TABLET | Freq: Every day | ORAL | 0 refills | Status: DC

## 2018-10-26 MED ORDER — GABAPENTIN 600 MG PO TABS: 600.0000 mg | ORAL_TABLET | Freq: Three times a day (TID) | ORAL | 0 refills | Status: DC

## 2018-10-26 NOTE — Telephone Encounter (Signed)
,   Meds ordered this encounter  Medications  . clopidogrel (PLAVIX) 75 MG tablet    Sig: Take 0.5 tablets (37.5 mg total) by mouth daily.    Dispense:  45 tablet    Refill:  0    Supervising Physician: Kathrynn Ducking, MD SRP:[id number] [place]: [id number]  . gabapentin (NEURONTIN) 600 MG tablet    Sig: Take 1 tablet (600 mg total) by mouth 3 (three) times daily.    Dispense:  270 tablet    Refill:  0    Supervising Physician: Kathrynn Ducking, MD RRN:[id number] DEA: [id number]    Penni Bombard, MD 11/10/43, 9:97 PM Certified in Neurology, Neurophysiology and Neuroimaging  [facility] [address], [place], [place] [address] [phone]

## 2018-11-05 ENCOUNTER — Telehealth: Payer: Self-pay | Admitting: *Deleted

## 2018-11-05 NOTE — Telephone Encounter (Signed)
Called pt. Due to current COVID 19 pandemic, our office is severely reducing in office visits until further notice, in order to minimize the risk to our patients and healthcare providers. Unable to get in contact with the patient to convert their office visit with [facility] on 6/4//2020 into a doxy.me visit. I left a voicemail asking the patient to return my call. Office number was provided.     If patient calls back please convert their office visit into a doxy.me visit.

## 2018-11-06 NOTE — Telephone Encounter (Signed)
11-06-18 Pt has called, she gave verbal consent to file insurance for doxy.me vv  Cell phone carrier [employer] [phone]  Pt understands that although there may be some limitations with this type of visit, we will take all precautions to reduce any security or privacy concerns.  Pt understands that this will be treated like an in office visit and we will file with pt's insurance, and there may be a patient responsible charge related to this service. *text message being sent *

## 2018-11-06 NOTE — Telephone Encounter (Signed)
Noted  

## 2018-11-08 ENCOUNTER — Other Ambulatory Visit: Payer: Self-pay

## 2018-11-08 ENCOUNTER — Encounter: Payer: Self-pay | Admitting: Adult Health

## 2018-11-08 ENCOUNTER — Ambulatory Visit (INDEPENDENT_AMBULATORY_CARE_PROVIDER_SITE_OTHER): Payer: Medicare Other | Admitting: Adult Health

## 2018-11-08 DIAGNOSIS — G63 Polyneuropathy in diseases classified elsewhere: Secondary | ICD-10-CM | POA: Diagnosis not present

## 2018-11-08 NOTE — Progress Notes (Signed)
PATIENT: Samantha Woodard DOB: 14-Mar-1941  REASON FOR VISIT: follow up HISTORY FROM: patient  Virtual Visit via Video Note  I connected with Samantha Woodard on 11/08/18 at  2:00 PM EDT by a video enabled telemedicine application located remotely at [facility] and verified that I am speaking with the correct person using two identifiers who was located at their own home.   I discussed the limitations of evaluation and management by telemedicine and the availability of in person appointments. The patient expressed understanding and agreed to proceed.   PATIENT: Samantha Woodard DOB: May 28, 1941  REASON FOR VISIT: follow up HISTORY FROM: patient  HISTORY OF PRESENT ILLNESS: Today 11/08/18:  [name] is an 78 year old female with a history of neuropathy.  She joins me today for virtual visit.  She reports overall her neuropathy has remained stable.  She does feel the left foot is more numb.  On occasion she may have sharp shooting pains in the feet but this is relatively stable.  She also noticed that the right index finger is numb at times.  She continues on gabapentin 300 mg 3 times a day and reports that this is working well for her.  Denies any significant changes with her gait or balance.  She joins me today for virtual visit.  HISTORY 10/16/17 [name] is a 78 year old female with a history of peripheral neuropathy.  She returns today for follow-up.  She remains on gabapentin 600 mg 3 times a day.  She reports that this continues to be beneficial for her neuropathy.  She denies any significant changes with her gait or balance.  Denies any falls.  She reports that she is no longer having sharp shooting pains in the toes.  She states that she did have a fall in February.  She states that this jarred her back and knee.  She states that a couple weeks ago she was reaching to lift a plant and injuring the right side of her back.  She states that Dr. Verl Blalock  gave her an  epidural steroid injection and she has found that very beneficial.  She states that she has been walking normally after the steroid injection.  Overall she feels that she is doing well.  She returns today for evaluation.  REVIEW OF SYSTEMS: Out of a complete 14 system review of symptoms, the patient complains only of the following symptoms, and all other reviewed systems are negative.  See HPI  ALLERGIES: Allergies  Allergen Reactions  . Iodine Hives    IV CONTRAST  . Neomycin Hives and Rash  . Tape Hives and Rash    HOME MEDICATIONS: Outpatient Medications Prior to Visit  Medication Sig Dispense Refill  . acetaminophen (TYLENOL) 500 MG tablet Take 500 mg by mouth every 6 (six) hours as needed.     Marland Kitchen aspirin EC 81 MG tablet Take 81 mg by mouth daily with supper.    . carisoprodol (SOMA) 350 MG tablet Take 350 mg by mouth. Take 1/2 - 1 as needed    . celecoxib (CELEBREX) 200 MG capsule Take 200 mg by mouth daily with lunch.     . cetirizine (ZYRTEC) 10 MG tablet Take 10 mg by mouth at bedtime.    . clopidogrel (PLAVIX) 75 MG tablet Take 0.5 tablets (37.5 mg total) by mouth daily. 45 tablet 0  . fluticasone (FLONASE) 50 MCG/ACT nasal spray Place 2 sprays into both nostrils at bedtime.     . gabapentin (NEURONTIN) 600  MG tablet Take 1 tablet (600 mg total) by mouth 3 (three) times daily. 270 tablet 0  . hydrochlorothiazide (HYDRODIURIL) 12.5 MG tablet Take 12.5 mg by mouth daily with lunch.     . Hydrocortisone (TDDUKGURK-27 EX) Apply 1 application topically 2 (two) times daily as needed (for insect bites/itchy skin).    Marland Kitchen lisinopril (PRINIVIL,ZESTRIL) 5 MG tablet Take 2.5 mg by mouth daily with lunch.     . Multiple Vitamin (MULTIVITAMIN WITH MINERALS) TABS tablet Take 1 tablet by mouth daily with supper. WOMEN'S 50+    . pantoprazole (PROTONIX) 40 MG tablet Take 40 mg by mouth daily before breakfast. 30 MINS TO 1 HOUR BEFORE EATING.    . Probiotic Product (ALIGN) 4 MG CAPS Take 4 mg by  mouth daily with lunch.    . Simethicone (PHAZYME) 180 MG CAPS Take 180 mg by mouth 2 (two) times daily as needed (FOR BLOATING/GAS).    Marland Kitchen simvastatin (ZOCOR) 20 MG tablet Take 20 mg by mouth every evening.     Marland Kitchen tetrahydrozoline-zinc (VISINE-AC) 0.05-0.25 % ophthalmic solution Place 2 drops into both eyes 3 (three) times daily as needed (for redness/itchy eyes.).    Marland Kitchen Wheat Dextrin (BENEFIBER) POWD Take 14.2 g by mouth 2 (two) times daily.      No facility-administered medications prior to visit.     PAST MEDICAL HISTORY: Past Medical History:  Diagnosis Date  . Anemia   . Anxiety and depression   . Arthritis, degenerative    scoliosis  . Atrial fibrillation ([place])    history of- pt. reports thats why she is on Plavix. But the afib is resolved.    . Cerebrovascular disease   . Chronic kidney disease   . Chronic low back pain   . Complication of anesthesia 1970's   post op- vertigo, odd sensation with " feeling like I was going to fall", also reports that the anesth. team remarked that she had a quick" response to anesth.    . Diverticulosis   . Dyslipidemia   . Fibromyalgia   . GERD (gastroesophageal reflux disease)   . HTN (hypertension)   . IBS (irritable bowel syndrome)   . Mild mitral valve prolapse   . Obesity   . OSA (obstructive sleep apnea)    tried CPAP, then Bipap, now on concentrator  . Peripheral neuropathy    feet  . Peripheral vascular disease ([place])   . Positional vertigo   . Scoliosis    lumbar 5  . Spina bifida occulta   . Stroke [facility])    pt. reports that she had TIA's in the past & has been on Plavix ever since.   . Varicose vein     PAST SURGICAL HISTORY: Past Surgical History:  Procedure Laterality Date  . ABDOMINAL HYSTERECTOMY    . CARDIAC CATHETERIZATION  1990's   no blockage   . CHOLECYSTECTOMY    . ENDOVENOUS ABLATION SAPHENOUS VEIN W/ LASER  10/22/2007  . gallbladder resection    . MINOR HEMORRHOIDECTOMY    . TONSILLECTOMY    . TOTAL  KNEE ARTHROPLASTY Left 11/28/2016   Procedure: TOTAL KNEE ARTHROPLASTY;  Surgeon: Frederik Pear, MD;  Location: [facility];  Service: Orthopedics;  Laterality: Left;    FAMILY HISTORY: Family History  Problem Relation Age of Onset  . Cancer Mother   . Hypertension Mother   . Varicose Veins Mother   . Heart attack Mother   . Heart attack Father   . Cancer Father   .  Heart disease Father   . Hypertension Father   . Diabetes Sister   . Hypertension Sister   . Hyperlipidemia Sister   . Varicose Veins Sister   . Hypertension Brother   . Hyperlipidemia Brother   . Cerebrovascular Disease Maternal Grandmother   . Stroke Paternal Grandmother   . Hypertension Sister   . Hyperlipidemia Sister   . Varicose Veins Sister   . Carpal tunnel syndrome Sister   . Breast cancer Neg Hx     SOCIAL HISTORY: Social History   Socioeconomic History  . Marital status: Married    Spouse name: Not on file  . Number of children: 2  . Years of education: college 2  . Highest education level: Not on file  Occupational History  . Occupation: Retired  Scientific laboratory technician  . Financial resource strain: Not on file  . Food insecurity:    Worry: Not on file    Inability: Not on file  . Transportation needs:    Medical: Not on file    Non-medical: Not on file  Tobacco Use  . Smoking status: Never Smoker  . Smokeless tobacco: Never Used  Substance and Sexual Activity  . Alcohol use: No    Comment: on occasion  . Drug use: No  . Sexual activity: Not on file  Lifestyle  . Physical activity:    Days per week: Not on file    Minutes per session: Not on file  . Stress: Not on file  Relationships  . Social connections:    Talks on phone: Not on file    Gets together: Not on file    Attends religious service: Not on file    Active member of club or organization: Not on file    Attends meetings of clubs or organizations: Not on file    Relationship status: Not on file  . Intimate partner violence:    Fear  of current or ex partner: Not on file    Emotionally abused: Not on file    Physically abused: Not on file    Forced sexual activity: Not on file  Other Topics Concern  . Not on file  Social History Narrative   Patient is right handed.   Patient does not drink caffeine.      PHYSICAL EXAM Generalized: Well developed, in no acute distress   Neurological examination  Mentation: Alert oriented to time, place, history taking. Follows all commands speech and language fluent Cranial nerve II-XII:Extraocular movements were full. Facial symmetry noted. uvula tongue midline. Head turning and shoulder shrug  were normal and symmetric. Motor: Good strength throughout subjectively per patient Sensory: Sensory testing is intact to soft touch on all 4 extremities subjectively per patient Coordination: Cerebellar testing reveals good finger-nose-finger  Gait and station: Patient is able to stand from a seated position. gait is normal.  Reflexes: UTA  DIAGNOSTIC DATA (LABS, IMAGING, TESTING) - I reviewed patient records, labs, notes, testing and imaging myself where available.  Lab Results  Component Value Date   WBC 9.0 11/30/2016   HGB 9.5 (L) 11/30/2016   HCT 30.3 (L) 11/30/2016   MCV 91.8 11/30/2016   PLT 281 11/30/2016      Component Value Date/Time   NA 134 (L) 11/29/2016 0538   K 3.9 11/29/2016 0538   CL 101 11/29/2016 0538   CO2 27 11/29/2016 0538   GLUCOSE 126 (H) 11/29/2016 0538   BUN 16 11/29/2016 0538   CREATININE 1.01 (H) 11/29/2016 0538   CALCIUM  8.5 (L) 11/29/2016 0538   GFRNONAA 52 (L) 11/29/2016 0538   GFRAA >60 11/29/2016 0538   No results found for: CHOL, HDL, LDLCALC, LDLDIRECT, TRIG, CHOLHDL No results found for: HGBA1C No results found for: VITAMINB12 No results found for: TSH    ASSESSMENT AND PLAN 78 y.o. year old female  has a past medical history of Anemia, Anxiety and depression, Arthritis, degenerative, Atrial fibrillation ([place]), Cerebrovascular  disease, Chronic kidney disease, Chronic low back pain, Complication of anesthesia (1970's), Diverticulosis, Dyslipidemia, Fibromyalgia, GERD (gastroesophageal reflux disease), HTN (hypertension), IBS (irritable bowel syndrome), Mild mitral valve prolapse, Obesity, OSA (obstructive sleep apnea), Peripheral neuropathy, Peripheral vascular disease ([place]), Positional vertigo, Scoliosis, Spina bifida occulta, Stroke ([place]), and Varicose vein. here with:  1. Peripheral neuropathy  Overall the patient is doing well. She will continue on gabapentin.  I have advised that if her symptoms worsen or she develops new symptoms she should let us know she will follow-up in 6 months or sooner as needed.   I spent 15 minutes with the patient I spent his time reviewing her chart prior to the visit, discussing her symptoms and plan of care.   Ward Givens, MSN, NP-C 11/08/2018, 2:15 PM [facility] [address] [place], [place] [address] [phone]

## 2018-11-12 ENCOUNTER — Encounter: Payer: Self-pay | Admitting: Podiatry

## 2018-11-12 ENCOUNTER — Other Ambulatory Visit: Payer: Self-pay

## 2018-11-12 ENCOUNTER — Ambulatory Visit: Payer: Medicare Other | Admitting: Podiatry

## 2018-11-12 VITALS — Temp 98.3°F

## 2018-11-12 DIAGNOSIS — M79676 Pain in unspecified toe(s): Secondary | ICD-10-CM | POA: Diagnosis not present

## 2018-11-12 DIAGNOSIS — B351 Tinea unguium: Secondary | ICD-10-CM

## 2018-11-12 DIAGNOSIS — D689 Coagulation defect, unspecified: Secondary | ICD-10-CM

## 2018-11-12 NOTE — Progress Notes (Signed)
Complaint:  Visit Type: Patient returns to my office for continued preventative foot care services. Complaint: Patient states" my nails have grown long and thick and become painful to walk and wear shoes"  The patient presents for preventative foot care services. No changes to ROS.  Patient is taking plavix.  Podiatric Exam: Vascular: dorsalis pedis and posterior tibial pulses are palpable bilateral. Capillary return is immediate. Temperature gradient is WNL. Skin turgor WNL  Sensorium: Normal Semmes Weinstein monofilament test. Normal tactile sensation bilaterally. Nail Exam: Pt has thick disfigured discolored nails with subungual debris noted bilateral entire nail hallux through fifth toenails except hallux nails  B/L Ulcer Exam: There is no evidence of ulcer or pre-ulcerative changes or infection. Orthopedic Exam: Muscle tone and strength are WNL. No limitations in general ROM. No crepitus or effusions noted. Foot type and digits show no abnormalities. Forefoot equinus foot type with hammer toes 2-5  B/L Skin: No Porokeratosis. No infection or ulcers.    Diagnosis:  Onychomycosis, , Pain in right toe, pain in left toes  Treatment & Plan Procedures and Treatment: Consent by patient was obtained for treatment procedures. The patient understood the discussion of treatment and procedures well. All questions were answered thoroughly reviewed. Debridement of mycotic and hypertrophic toenails, 2 through 5 bilateral and clearing of subungual debris. No ulceration, no infection noted.    Return Visit-Office Procedure: Patient instructed to return to the office for a follow up visit 9 weeks  for continued evaluation and treatment.    Gardiner Barefoot DPM

## 2018-11-19 ENCOUNTER — Telehealth: Payer: Self-pay | Admitting: *Deleted

## 2018-11-19 NOTE — Telephone Encounter (Signed)
LMVM for pt that MM/NP trying to get lab (renal funtion) results from other providers and if not done recently would like to have done.  Please return call.

## 2018-11-20 ENCOUNTER — Encounter: Payer: Self-pay | Admitting: *Deleted

## 2018-11-20 NOTE — Telephone Encounter (Signed)
Opened in error

## 2018-11-20 NOTE — Telephone Encounter (Signed)
LMVM for pt that trying to get lab results if done previously by pcp, or if not to have done by [place].  (2nd call).

## 2018-11-21 ENCOUNTER — Encounter: Payer: Self-pay | Admitting: Adult Health

## 2018-11-22 NOTE — Telephone Encounter (Signed)
Received my chart message from patient with the results. This was forwarded to [facility], NP, and I replied to her my chart message. She has read it and replied back.

## 2018-11-22 NOTE — Telephone Encounter (Signed)
Yes I need her labs

## 2019-01-14 ENCOUNTER — Ambulatory Visit (INDEPENDENT_AMBULATORY_CARE_PROVIDER_SITE_OTHER): Payer: Medicare Other | Admitting: Podiatry

## 2019-01-14 ENCOUNTER — Other Ambulatory Visit: Payer: Self-pay

## 2019-01-14 ENCOUNTER — Encounter: Payer: Self-pay | Admitting: Podiatry

## 2019-01-14 VITALS — Temp 97.9°F

## 2019-01-14 DIAGNOSIS — B351 Tinea unguium: Secondary | ICD-10-CM | POA: Diagnosis not present

## 2019-01-14 DIAGNOSIS — D689 Coagulation defect, unspecified: Secondary | ICD-10-CM | POA: Diagnosis not present

## 2019-01-14 DIAGNOSIS — M79676 Pain in unspecified toe(s): Secondary | ICD-10-CM | POA: Diagnosis not present

## 2019-01-14 NOTE — Progress Notes (Signed)
Complaint:  Visit Type: Patient returns to my office for continued preventative foot care services. Complaint: Patient states" my nails have grown long and thick and become painful to walk and wear shoes"  The patient presents for preventative foot care services. No changes to ROS.  Patient is taking plavix.  Podiatric Exam: Vascular: dorsalis pedis and posterior tibial pulses are palpable bilateral. Capillary return is immediate. Temperature gradient is WNL. Skin turgor WNL  Sensorium: Normal Semmes Weinstein monofilament test. Normal tactile sensation bilaterally. Nail Exam: Pt has thick disfigured discolored nails with subungual debris noted bilateral entire nail hallux through fifth toenails except hallux nails  B/L Ulcer Exam: There is no evidence of ulcer or pre-ulcerative changes or infection. Orthopedic Exam: Muscle tone and strength are WNL. No limitations in general ROM. No crepitus or effusions noted. Foot type and digits show no abnormalities. Forefoot equinus foot type with hammer toes 2-5  B/L Skin: No Porokeratosis. No infection or ulcers.    Diagnosis:  Onychomycosis, , Pain in right toe, pain in left toes  Treatment & Plan Procedures and Treatment: Consent by patient was obtained for treatment procedures. The patient understood the discussion of treatment and procedures well. All questions were answered thoroughly reviewed. Debridement of mycotic and hypertrophic toenails, 2 through 5 bilateral and clearing of subungual debris. No ulceration, no infection noted.    Return Visit-Office Procedure: Patient instructed to return to the office for a follow up visit 9 weeks  for continued evaluation and treatment.    Gardiner Barefoot DPM

## 2019-02-08 ENCOUNTER — Other Ambulatory Visit: Payer: Self-pay | Admitting: Obstetrics and Gynecology

## 2019-02-08 DIAGNOSIS — Z1231 Encounter for screening mammogram for malignant neoplasm of breast: Secondary | ICD-10-CM

## 2019-02-15 ENCOUNTER — Encounter: Payer: Self-pay | Admitting: Adult Health

## 2019-02-25 ENCOUNTER — Other Ambulatory Visit: Payer: Self-pay | Admitting: Adult Health

## 2019-02-25 NOTE — Telephone Encounter (Signed)
Pt is needing a refill oh her gabapentin (NEURONTIN) 600 MG tablet and her clopidogrel (PLAVIX) 75 MG tablet [facility]

## 2019-02-26 MED ORDER — CLOPIDOGREL BISULFATE 75 MG PO TABS: 37.5000 mg | ORAL_TABLET | Freq: Every day | ORAL | 1 refills | Status: DC

## 2019-02-26 MED ORDER — GABAPENTIN 600 MG PO TABS: 600.0000 mg | ORAL_TABLET | Freq: Three times a day (TID) | ORAL | 1 refills | Status: DC

## 2019-03-13 ENCOUNTER — Encounter: Payer: Self-pay | Admitting: Adult Health

## 2019-03-14 ENCOUNTER — Other Ambulatory Visit: Payer: Self-pay | Admitting: *Deleted

## 2019-03-14 MED ORDER — CLOPIDOGREL BISULFATE 75 MG PO TABS: 37.5000 mg | ORAL_TABLET | Freq: Every day | ORAL | 1 refills | Status: DC

## 2019-03-14 MED ORDER — GABAPENTIN 600 MG PO TABS: 600.0000 mg | ORAL_TABLET | Freq: Three times a day (TID) | ORAL | 1 refills | Status: DC

## 2019-03-18 ENCOUNTER — Encounter: Payer: Self-pay | Admitting: Podiatry

## 2019-03-18 ENCOUNTER — Other Ambulatory Visit: Payer: Self-pay

## 2019-03-18 ENCOUNTER — Ambulatory Visit: Payer: Medicare Other | Admitting: Podiatry

## 2019-03-18 DIAGNOSIS — B351 Tinea unguium: Secondary | ICD-10-CM

## 2019-03-18 DIAGNOSIS — D689 Coagulation defect, unspecified: Secondary | ICD-10-CM | POA: Diagnosis not present

## 2019-03-18 DIAGNOSIS — M79676 Pain in unspecified toe(s): Secondary | ICD-10-CM | POA: Diagnosis not present

## 2019-03-18 NOTE — Progress Notes (Signed)
Complaint:  Visit Type: Patient returns to my office for continued preventative foot care services. Complaint: Patient states" my nails have grown long and thick and become painful to walk and wear shoes"  The patient presents for preventative foot care services. No changes to ROS.  Patient is taking plavix.  Podiatric Exam: Vascular: dorsalis pedis and posterior tibial pulses are palpable bilateral. Capillary return is immediate. Temperature gradient is WNL. Skin turgor WNL  Sensorium: Normal Semmes Weinstein monofilament test. Normal tactile sensation bilaterally. Nail Exam: Pt has thick disfigured discolored nails with subungual debris noted bilateral entire nail hallux through fifth toenails except hallux nails  B/L Ulcer Exam: There is no evidence of ulcer or pre-ulcerative changes or infection. Orthopedic Exam: Muscle tone and strength are WNL. No limitations in general ROM. No crepitus or effusions noted. Foot type and digits show no abnormalities. Forefoot equinus foot type with hammer toes 2-5  B/L Skin: No Porokeratosis. No infection or ulcers.    Diagnosis:  Onychomycosis, , Pain in right toe, pain in left toes  Treatment & Plan Procedures and Treatment: Consent by patient was obtained for treatment procedures. The patient understood the discussion of treatment and procedures well. All questions were answered thoroughly reviewed. Debridement of mycotic and hypertrophic toenails, 2 through 5 bilateral and clearing of subungual debris. No ulceration, no infection noted.    Return Visit-Office Procedure: Patient instructed to return to the office for a follow up visit 9 weeks  for continued evaluation and treatment.    Henrick Mcgue DPM 

## 2019-03-25 ENCOUNTER — Ambulatory Visit (INDEPENDENT_AMBULATORY_CARE_PROVIDER_SITE_OTHER): Payer: Medicare Other | Admitting: Obstetrics and Gynecology

## 2019-03-25 ENCOUNTER — Encounter: Payer: Self-pay | Admitting: Obstetrics and Gynecology

## 2019-03-25 ENCOUNTER — Other Ambulatory Visit (HOSPITAL_COMMUNITY)
Admission: RE | Admit: 2019-03-25 | Discharge: 2019-03-25 | Disposition: A | Discharge status: Home / Self Care | Payer: Medicare Other | Source: Ambulatory Visit | Attending: Obstetrics and Gynecology | Admitting: Obstetrics and Gynecology

## 2019-03-25 ENCOUNTER — Other Ambulatory Visit: Payer: Self-pay

## 2019-03-25 VITALS — BP 112/68 | HR 81 | Ht 68.0 in | Wt 226.0 lb

## 2019-03-25 DIAGNOSIS — Z1239 Encounter for other screening for malignant neoplasm of breast: Secondary | ICD-10-CM

## 2019-03-25 DIAGNOSIS — Z124 Encounter for screening for malignant neoplasm of cervix: Secondary | ICD-10-CM

## 2019-03-25 DIAGNOSIS — Z01419 Encounter for gynecological examination (general) (routine) without abnormal findings: Secondary | ICD-10-CM | POA: Diagnosis not present

## 2019-03-25 NOTE — Patient Instructions (Signed)
[facility] [address] [place] [address]  [facility] [place]  [address] [place] [address]  Phone: [phone]  

## 2019-03-25 NOTE — Progress Notes (Signed)
Gynecology Annual Exam  PCP: Antony Contras, MD  Chief Complaint:  Chief Complaint  Patient presents with  . Gynecologic Exam    History of Present Illness:Patient is a 78 y.o. No obstetric history on file. presents for annual exam. The patient has no complaints today.   LMP: No LMP recorded. Patient has had a hysterectomy.  The patient does perform self breast exams.  There is notable family history of breast or ovarian cancer in her family.  The patient wears seatbelts: yes.   The patient has regular exercise: not asked.    The patient denies current symptoms of depression.     Review of Systems: Review of Systems  Constitutional: Negative for chills and fever.  HENT: Negative for congestion.   Respiratory: Negative for cough and shortness of breath.   Cardiovascular: Negative for chest pain and palpitations.  Gastrointestinal: Negative for abdominal pain, constipation, diarrhea, heartburn, nausea and vomiting.  Genitourinary: Negative for dysuria, frequency and urgency.  Skin: Negative for itching and rash.  Neurological: Negative for dizziness and headaches.  Endo/Heme/Allergies: Negative for polydipsia.  Psychiatric/Behavioral: Negative for depression.    Past Medical History:  Past Medical History:  Diagnosis Date  . Anemia   . Anxiety and depression   . Arthritis, degenerative    scoliosis  . Atrial fibrillation ([place])    history of- pt. reports thats why she is on Plavix. But the afib is resolved.    . Cerebrovascular disease   . Chronic kidney disease   . Chronic low back pain   . Complication of anesthesia 1970's   post op- vertigo, odd sensation with " feeling like I was going to fall", also reports that the anesth. team remarked that she had a quick" response to anesth.    . Diverticulosis   . Dyslipidemia   . Fibromyalgia   . GERD (gastroesophageal reflux disease)   . HTN (hypertension)   . IBS (irritable bowel syndrome)   . Mild mitral valve  prolapse   . Obesity   . OSA (obstructive sleep apnea)    tried CPAP, then Bipap, now on concentrator  . Peripheral neuropathy    feet  . Peripheral vascular disease ([place])   . Positional vertigo   . Scoliosis    lumbar 5  . Spina bifida occulta   . Stroke [facility])    pt. reports that she had TIA's in the past & has been on Plavix ever since.   . Varicose vein     Past Surgical History:  Past Surgical History:  Procedure Laterality Date  . ABDOMINAL HYSTERECTOMY    . CARDIAC CATHETERIZATION  1990's   no blockage   . CHOLECYSTECTOMY    . ENDOVENOUS ABLATION SAPHENOUS VEIN W/ LASER  10/22/2007  . gallbladder resection    . MINOR HEMORRHOIDECTOMY    . TONSILLECTOMY    . TOTAL KNEE ARTHROPLASTY Left 11/28/2016   Procedure: TOTAL KNEE ARTHROPLASTY;  Surgeon: Frederik Pear, MD;  Location: [facility];  Service: Orthopedics;  Laterality: Left;    Gynecologic History:  No LMP recorded. Patient has had a hysterectomy. Obstetric History: No obstetric history on file.  Family History:  Family History  Problem Relation Age of Onset  . Cancer Mother   . Hypertension Mother   . Varicose Veins Mother   . Heart attack Mother   . Heart attack Father   . Cancer Father   . Heart disease Father   . Hypertension Father   . Diabetes  Sister   . Hypertension Sister   . Hyperlipidemia Sister   . Varicose Veins Sister   . Hypertension Brother   . Hyperlipidemia Brother   . Cerebrovascular Disease Maternal Grandmother   . Stroke Paternal Grandmother   . Hypertension Sister   . Hyperlipidemia Sister   . Varicose Veins Sister   . Carpal tunnel syndrome Sister   . Breast cancer Neg Hx     Social History:  Social History   Socioeconomic History  . Marital status: Married    Spouse name: Not on file  . Number of children: 2  . Years of education: college 2  . Highest education level: Not on file  Occupational History  . Occupation: Retired  Scientific laboratory technician  . Financial resource strain: Not  on file  . Food insecurity    Worry: Not on file    Inability: Not on file  . Transportation needs    Medical: Not on file    Non-medical: Not on file  Tobacco Use  . Smoking status: Never Smoker  . Smokeless tobacco: Never Used  Substance and Sexual Activity  . Alcohol use: No    Comment: on occasion  . Drug use: No  . Sexual activity: Not on file  Lifestyle  . Physical activity    Days per week: Not on file    Minutes per session: Not on file  . Stress: Not on file  Relationships  . Social Herbalist on phone: Not on file    Gets together: Not on file    Attends religious service: Not on file    Active member of club or organization: Not on file    Attends meetings of clubs or organizations: Not on file    Relationship status: Not on file  . Intimate partner violence    Fear of current or ex partner: Not on file    Emotionally abused: Not on file    Physically abused: Not on file    Forced sexual activity: Not on file  Other Topics Concern  . Not on file  Social History Narrative   Patient is right handed.   Patient does not drink caffeine.    Allergies:  Allergies  Allergen Reactions  . Iodine Hives    IV CONTRAST  . Neomycin Hives and Rash  . Tape Hives and Rash    Medications: Prior to Admission medications   Medication Sig Start Date End Date Taking? Authorizing Provider  acetaminophen (TYLENOL) 500 MG tablet Take 500 mg by mouth every 6 (six) hours as needed.    Yes [provider]  aspirin EC 81 MG tablet Take 81 mg by mouth daily with supper.   Yes [provider]  carisoprodol (SOMA) 350 MG tablet Take 350 mg by mouth. Take 1/2 - 1 as needed   Yes [provider]  celecoxib (CELEBREX) 200 MG capsule Take 200 mg by mouth daily with lunch.  10/04/13  Yes [provider]  cetirizine (ZYRTEC) 10 MG tablet Take 10 mg by mouth at bedtime.   Yes [provider]  clopidogrel (PLAVIX) 75 MG tablet Take 0.5  tablets (37.5 mg total) by mouth daily. 03/14/19  Yes Ward Givens, NP  fluticasone (FLONASE) 50 MCG/ACT nasal spray Place 2 sprays into both nostrils at bedtime.  08/16/13  Yes [provider]  gabapentin (NEURONTIN) 600 MG tablet Take 1 tablet (600 mg total) by mouth 3 (three) times daily. 03/14/19  Yes Ward Givens,  NP  hydrochlorothiazide (HYDRODIURIL) 12.5 MG tablet Take 12.5 mg by mouth daily with lunch.  07/18/13  Yes [provider]  Hydrocortisone (0000000 EX) Apply 1 application topically 2 (two) times daily as needed (for insect bites/itchy skin).   Yes [provider]  lisinopril (PRINIVIL,ZESTRIL) 5 MG tablet Take 2.5 mg by mouth daily with lunch.  07/17/13  Yes [provider]  Multiple Vitamin (MULTIVITAMIN WITH MINERALS) TABS tablet Take 1 tablet by mouth daily with supper. WOMEN'S 50+   Yes [provider]  pantoprazole (PROTONIX) 40 MG tablet Take 40 mg by mouth daily before breakfast. 30 MINS TO 1 HOUR BEFORE EATING. 07/26/13  Yes [provider]  Probiotic Product (ALIGN) 4 MG CAPS Take 4 mg by mouth daily with lunch.   Yes [provider]  Simethicone (PHAZYME) 180 MG CAPS Take 180 mg by mouth 2 (two) times daily as needed (FOR BLOATING/GAS).   Yes [provider]  simvastatin (ZOCOR) 20 MG tablet Take 20 mg by mouth every evening.  09/03/13  Yes [provider]  tetrahydrozoline-zinc (VISINE-AC) 0.05-0.25 % ophthalmic solution Place 2 drops into both eyes 3 (three) times daily as needed (for redness/itchy eyes.).   Yes [provider]  Wheat Dextrin (BENEFIBER) POWD Take 14.2 g by mouth 2 (two) times daily.    Yes [provider]  predniSONE (DELTASONE) 20 MG tablet Take 20 mg by mouth See admin instructions. 02/18/19   [provider]    Physical Exam Vitals: Blood pressure 112/68, pulse 81, height 5\' 8"  (1.727 m), weight 226 lb (102.5 kg).  General: NAD HEENT:  normocephalic, anicteric Thyroid: no enlargement, no palpable nodules Pulmonary: No increased work of breathing, CTAB Cardiovascular: RRR, distal pulses 2+ Breast: Breast symmetrical, no tenderness, no palpable nodules or masses, no skin or nipple retraction present, no nipple discharge.  No axillary or supraclavicular lymphadenopathy. Abdomen: NABS, soft, non-tender, non-distended.  Umbilicus without lesions.  No hepatomegaly, splenomegaly or masses palpable. No evidence of hernia  Genitourinary:  External: Normal external female genitalia.  Normal urethral meatus, normal Bartholin's and Skene's glands.    Vagina: Normal vaginal mucosa, no evidence of prolapse.    Cervix: surgically absent  Uterus: surgically absent  Adnexa: ovaries non-enlarged, no adnexal masses  Rectal: deferred  Lymphatic: no evidence of inguinal lymphadenopathy Extremities: no edema, erythema, or tenderness Neurologic: Grossly intact Psychiatric: mood appropriate, affect full  Female chaperone present for pelvic and breast  portions of the physical exam     Assessment: 78 y.o. No obstetric history on file. routine annual exam  Plan: Problem List Items Addressed This Visit    None    Visit Diagnoses    Encounter for gynecological examination without abnormal finding    -  Primary   Breast screening       Relevant Orders   MM 3D SCREEN BREAST BILATERAL   Cervical cancer screening       Relevant Orders   Cytology - PAP      1) Mammogram - recommend yearly screening mammogram.  Mammogram Was ordered today  2) STI screening  was notoffered and therefore not obtained  3) ASCCP guidelines and rational discussed.  Patient opts for every 2 year screening interval.  We had previously discussed lack of diagnostic efficacy in patient with prior hysterectomy and no cervix.  4) Osteoporosis - screening up to date  5) Routine healthcare maintenance including cholesterol, diabetes screening discussed managed by  PCP   6) Return in  about 1 year (around 03/24/2020) for annual.    Malachy Mood, MD Mosetta Pigeon, [facility] 03/25/2019, 1:57 PM

## 2019-03-27 LAB — CYTOLOGY - PAP: Diagnosis: NEGATIVE

## 2019-04-01 ENCOUNTER — Ambulatory Visit
Admission: RE | Admit: 2019-04-01 | Discharge: 2019-04-01 | Disposition: A | Discharge status: Home / Self Care | Payer: Medicare Other | Source: Ambulatory Visit | Attending: Obstetrics and Gynecology | Admitting: Obstetrics and Gynecology

## 2019-04-01 DIAGNOSIS — Z1231 Encounter for screening mammogram for malignant neoplasm of breast: Secondary | ICD-10-CM | POA: Diagnosis not present

## 2019-05-15 ENCOUNTER — Encounter: Payer: Self-pay | Admitting: Adult Health

## 2019-05-15 ENCOUNTER — Ambulatory Visit: Payer: Medicare Other | Admitting: Adult Health

## 2019-05-20 ENCOUNTER — Ambulatory Visit: Payer: Medicare Other | Admitting: Podiatry

## 2019-05-27 ENCOUNTER — Ambulatory Visit: Payer: Medicare Other | Admitting: Podiatry

## 2019-05-27 ENCOUNTER — Other Ambulatory Visit: Payer: Self-pay

## 2019-05-27 ENCOUNTER — Encounter: Payer: Self-pay | Admitting: Podiatry

## 2019-05-27 DIAGNOSIS — D689 Coagulation defect, unspecified: Secondary | ICD-10-CM

## 2019-05-27 DIAGNOSIS — M79676 Pain in unspecified toe(s): Secondary | ICD-10-CM

## 2019-05-27 DIAGNOSIS — B351 Tinea unguium: Secondary | ICD-10-CM | POA: Diagnosis not present

## 2019-05-27 NOTE — Progress Notes (Signed)
Complaint:  Visit Type: Patient returns to my office for continued preventative foot care services. Complaint: Patient states" my nails have grown long and thick and become painful to walk and wear shoes"  The patient presents for preventative foot care services. No changes to ROS.  Patient is taking plavix.  Podiatric Exam: Vascular: dorsalis pedis and posterior tibial pulses are palpable bilateral. Capillary return is immediate. Temperature gradient is WNL. Skin turgor WNL  Sensorium: Normal Semmes Weinstein monofilament test. Normal tactile sensation bilaterally. Nail Exam: Pt has thick disfigured discolored nails with subungual debris noted bilateral entire nail hallux through fifth toenails except hallux nails  B/L Ulcer Exam: There is no evidence of ulcer or pre-ulcerative changes or infection. Orthopedic Exam: Muscle tone and strength are WNL. No limitations in general ROM. No crepitus or effusions noted. Foot type and digits show no abnormalities. Forefoot equinus foot type with hammer toes 2-5  B/L Skin: No Porokeratosis. No infection or ulcers.    Diagnosis:  Onychomycosis, , Pain in right toe, pain in left toes  Treatment & Plan Procedures and Treatment: Consent by patient was obtained for treatment procedures. The patient understood the discussion of treatment and procedures well. All questions were answered thoroughly reviewed. Debridement of mycotic and hypertrophic toenails, 2 through 5 bilateral and clearing of subungual debris. No ulceration, no infection noted.    Return Visit-Office Procedure: Patient instructed to return to the office for a follow up visit 9 weeks  for continued evaluation and treatment.    Gardiner Barefoot DPM

## 2019-07-29 ENCOUNTER — Other Ambulatory Visit: Payer: Self-pay

## 2019-07-29 ENCOUNTER — Encounter: Payer: Self-pay | Admitting: Podiatry

## 2019-07-29 ENCOUNTER — Ambulatory Visit: Payer: Medicare Other | Admitting: Podiatry

## 2019-07-29 DIAGNOSIS — B351 Tinea unguium: Secondary | ICD-10-CM

## 2019-07-29 DIAGNOSIS — M79676 Pain in unspecified toe(s): Secondary | ICD-10-CM

## 2019-07-29 DIAGNOSIS — D689 Coagulation defect, unspecified: Secondary | ICD-10-CM | POA: Diagnosis not present

## 2019-07-29 NOTE — Progress Notes (Signed)
Complaint:  Visit Type: Patient returns to my office for continued preventative foot care services. Complaint: Patient states" my nails have grown long and thick and become painful to walk and wear shoes"  The patient presents for preventative foot care services. No changes to ROS.  Patient is taking plavix.  Podiatric Exam: Vascular: dorsalis pedis and posterior tibial pulses are palpable bilateral. Capillary return is immediate. Temperature gradient is WNL. Skin turgor WNL  Sensorium: Normal Semmes Weinstein monofilament test. Normal tactile sensation bilaterally. Nail Exam: Pt has thick disfigured discolored nails with subungual debris noted bilateral entire nail hallux through fifth toenails except hallux nails  B/L Ulcer Exam: There is no evidence of ulcer or pre-ulcerative changes or infection. Orthopedic Exam: Muscle tone and strength are WNL. No limitations in general ROM. No crepitus or effusions noted. Foot type and digits show no abnormalities. Forefoot equinus foot type with hammer toes 2-5  B/L Skin: No Porokeratosis. No infection or ulcers.    Diagnosis:  Onychomycosis, , Pain in right toe, pain in left toes  Treatment & Plan Procedures and Treatment: Consent by patient was obtained for treatment procedures. The patient understood the discussion of treatment and procedures well. All questions were answered thoroughly reviewed. Debridement of mycotic and hypertrophic toenails, 2 through 5 bilateral and clearing of subungual debris. No ulceration, no infection noted.    Return Visit-Office Procedure: Patient instructed to return to the office for a follow up visit 9 weeks  for continued evaluation and treatment.    Gardiner Barefoot DPM

## 2019-08-15 ENCOUNTER — Encounter: Payer: Self-pay | Admitting: Adult Health

## 2019-08-21 ENCOUNTER — Telehealth: Payer: Medicare Other | Admitting: Adult Health

## 2019-08-21 ENCOUNTER — Telehealth (INDEPENDENT_AMBULATORY_CARE_PROVIDER_SITE_OTHER): Payer: Medicare Other | Admitting: Adult Health

## 2019-08-21 DIAGNOSIS — G63 Polyneuropathy in diseases classified elsewhere: Secondary | ICD-10-CM

## 2019-08-21 MED ORDER — GABAPENTIN 600 MG PO TABS: 600.0000 mg | ORAL_TABLET | Freq: Three times a day (TID) | ORAL | 3 refills | Status: DC

## 2019-08-21 NOTE — Progress Notes (Signed)
PATIENT: Samantha Woodard DOB: Nov 03, 1940  REASON FOR VISIT: follow up HISTORY FROM: patient  Virtual Visit via Video Note  I connected with Samantha Woodard on 08/21/19 at 10:00 AM EDT by a video enabled telemedicine application located remotely at [facility] and verified that I am speaking with the correct person using two identifiers who was located at their own home.   I discussed the limitations of evaluation and management by telemedicine and the availability of in person appointments. The patient expressed understanding and agreed to proceed.   PATIENT: Samantha Woodard DOB: 05-10-1941  REASON FOR VISIT: follow up HISTORY FROM: patient  HISTORY OF PRESENT ILLNESS: Today 08/21/19: Samantha Woodard is an 79 year old female with a history of neuropathy.  She joins me today for virtual visit.  Overall she is doing well.  Reports that gabapentin continues to work well for her.  She takes 600 mg 3 times a day.  On occasion she may have sharp shooting pains in the feet but this is not frequent.  She denies any new symptoms.  Patient was unable to get her videocamera to work therefore I cannot visualize the patient during our virtual visit.  HISTORY 11/08/2018: Samantha Woodard is an 79 year old female with a history of neuropathy.  She joins me today for virtual visit.  She reports overall her neuropathy has remained stable.  She does feel the left foot is more numb.  On occasion she may have sharp shooting pains in the feet but this is relatively stable.  She also noticed that the right index finger is numb at times.  She continues on gabapentin 300 mg 3 times a day and reports that this is working well for her.  Denies any significant changes with her gait or balance.  She joins me today for virtual visit.  REVIEW OF SYSTEMS: Out of a complete 14 system review of symptoms, the patient complains only of the following symptoms, and all other reviewed systems are  negative.  ALLERGIES: Allergies  Allergen Reactions  . Iodine Hives    IV CONTRAST  . Neomycin Hives and Rash  . Tape Hives and Rash    HOME MEDICATIONS: Outpatient Medications Prior to Visit  Medication Sig Dispense Refill  . acetaminophen (TYLENOL) 500 MG tablet Take 500 mg by mouth every 6 (six) hours as needed.     Marland Kitchen aspirin EC 81 MG tablet Take 81 mg by mouth daily with supper.    . carisoprodol (SOMA) 350 MG tablet Take 350 mg by mouth. Take 1/2 - 1 as needed    . celecoxib (CELEBREX) 200 MG capsule Take 200 mg by mouth daily with lunch.     . cetirizine (ZYRTEC) 10 MG tablet Take 10 mg by mouth at bedtime.    . clopidogrel (PLAVIX) 75 MG tablet Take 0.5 tablets (37.5 mg total) by mouth daily. 135 tablet 1  . fluticasone (FLONASE) 50 MCG/ACT nasal spray Place 2 sprays into both nostrils at bedtime.     . hydrochlorothiazide (HYDRODIURIL) 12.5 MG tablet Take 12.5 mg by mouth daily with lunch.     . Hydrocortisone (0000000 EX) Apply 1 application topically 2 (two) times daily as needed (for insect bites/itchy skin).    Marland Kitchen lisinopril (PRINIVIL,ZESTRIL) 5 MG tablet Take 2.5 mg by mouth daily with lunch.     . Multiple Vitamin (MULTIVITAMIN WITH MINERALS) TABS tablet Take 1 tablet by mouth daily with supper. WOMEN'S 50+    . pantoprazole (PROTONIX) 40 MG tablet Take  40 mg by mouth daily before breakfast. 30 MINS TO 1 HOUR BEFORE EATING.    . predniSONE (DELTASONE) 20 MG tablet Take 20 mg by mouth See admin instructions.    . Probiotic Product (ALIGN) 4 MG CAPS Take 4 mg by mouth daily with lunch.    . Simethicone (PHAZYME) 180 MG CAPS Take 180 mg by mouth 2 (two) times daily as needed (FOR BLOATING/GAS).    Marland Kitchen simvastatin (ZOCOR) 20 MG tablet Take 20 mg by mouth every evening.     Marland Kitchen tetrahydrozoline-zinc (VISINE-AC) 0.05-0.25 % ophthalmic solution Place 2 drops into both eyes 3 (three) times daily as needed (for redness/itchy eyes.).    Marland Kitchen Wheat Dextrin (BENEFIBER) POWD Take 14.2 g  by mouth 2 (two) times daily.     Marland Kitchen gabapentin (NEURONTIN) 600 MG tablet Take 1 tablet (600 mg total) by mouth 3 (three) times daily. 270 tablet 1   No facility-administered medications prior to visit.    PAST MEDICAL HISTORY: Past Medical History:  Diagnosis Date  . Anemia   . Anxiety and depression   . Arthritis, degenerative    scoliosis  . Atrial fibrillation ([place])    history of- pt. reports thats why she is on Plavix. But the afib is resolved.    . Cerebrovascular disease   . Chronic kidney disease   . Chronic low back pain   . Complication of anesthesia 1970's   post op- vertigo, odd sensation with " feeling like I was going to fall", also reports that the anesth. team remarked that she had a quick" response to anesth.    . Diverticulosis   . Dyslipidemia   . Fibromyalgia   . GERD (gastroesophageal reflux disease)   . HTN (hypertension)   . IBS (irritable bowel syndrome)   . Mild mitral valve prolapse   . Obesity   . OSA (obstructive sleep apnea)    tried CPAP, then Bipap, now on concentrator  . Peripheral neuropathy    feet  . Peripheral vascular disease ([place])   . Positional vertigo   . Scoliosis    lumbar 5  . Spina bifida occulta   . Stroke [facility])    pt. reports that she had TIA's in the past & has been on Plavix ever since.   . Varicose vein     PAST SURGICAL HISTORY: Past Surgical History:  Procedure Laterality Date  . ABDOMINAL HYSTERECTOMY    . CARDIAC CATHETERIZATION  1990's   no blockage   . CHOLECYSTECTOMY    . ENDOVENOUS ABLATION SAPHENOUS VEIN W/ LASER  10/22/2007  . gallbladder resection    . MINOR HEMORRHOIDECTOMY    . TONSILLECTOMY    . TOTAL KNEE ARTHROPLASTY Left 11/28/2016   Procedure: TOTAL KNEE ARTHROPLASTY;  Surgeon: Frederik Pear, MD;  Location: [place];  Service: Orthopedics;  Laterality: Left;    FAMILY HISTORY: Family History  Problem Relation Age of Onset  . Cancer Mother   . Hypertension Mother   . Varicose Veins Mother   .  Heart attack Mother   . Heart attack Father   . Cancer Father   . Heart disease Father   . Hypertension Father   . Diabetes Sister   . Hypertension Sister   . Hyperlipidemia Sister   . Varicose Veins Sister   . Hypertension Brother   . Hyperlipidemia Brother   . Cerebrovascular Disease Maternal Grandmother   . Stroke Paternal Grandmother   . Hypertension Sister   . Hyperlipidemia Sister   .  Varicose Veins Sister   . Carpal tunnel syndrome Sister   . Breast cancer Neg Hx     SOCIAL HISTORY: Social History   Socioeconomic History  . Marital status: Married    Spouse name: Not on file  . Number of children: 2  . Years of education: college 2  . Highest education level: Not on file  Occupational History  . Occupation: Retired  Tobacco Use  . Smoking status: Never Smoker  . Smokeless tobacco: Never Used  Substance and Sexual Activity  . Alcohol use: No    Comment: on occasion  . Drug use: No  . Sexual activity: Not on file  Other Topics Concern  . Not on file  Social History Narrative   Patient is right handed.   Patient does not drink caffeine.   Social Determinants of Health   Financial Resource Strain:   . Difficulty of Paying Living Expenses:   Food Insecurity:   . Worried About Charity fundraiser in the Last Year:   . Arboriculturist in the Last Year:   Transportation Needs:   . Film/video editor (Medical):   Marland Kitchen Lack of Transportation (Non-Medical):   Physical Activity:   . Days of Exercise per Week:   . Minutes of Exercise per Session:   Stress:   . Feeling of Stress :   Social Connections:   . Frequency of Communication with Friends and Family:   . Frequency of Social Gatherings with Friends and Family:   . Attends Religious Services:   . Active Member of Clubs or Organizations:   . Attends Archivist Meetings:   Marland Kitchen Marital Status:   Intimate Partner Violence:   . Fear of Current or Ex-Partner:   . Emotionally Abused:   Marland Kitchen  Physically Abused:   . Sexually Abused:       PHYSICAL EXAM Generalized: Well developed, in no acute distress   Neurological examination  Mentation: Alert oriented to time, place, history taking. speech and language fluent   DIAGNOSTIC DATA (LABS, IMAGING, TESTING) - I reviewed patient records, labs, notes, testing and imaging myself where available.  Lab Results  Component Value Date   WBC 9.0 11/30/2016   HGB 9.5 (L) 11/30/2016   HCT 30.3 (L) 11/30/2016   MCV 91.8 11/30/2016   PLT 281 11/30/2016      Component Value Date/Time   NA 134 (L) 11/29/2016 0538   K 3.9 11/29/2016 0538   CL 101 11/29/2016 0538   CO2 27 11/29/2016 0538   GLUCOSE 126 (H) 11/29/2016 0538   BUN 16 11/29/2016 0538   CREATININE 1.01 (H) 11/29/2016 0538   CALCIUM 8.5 (L) 11/29/2016 0538   GFRNONAA 52 (L) 11/29/2016 0538   GFRAA >60 11/29/2016 0538   No results found for: CHOL, HDL, LDLCALC, LDLDIRECT, TRIG, CHOLHDL No results found for: HGBA1C No results found for: VITAMINB12 No results found for: TSH    ASSESSMENT AND PLAN 79 y.o. year old female  has a past medical history of Anemia, Anxiety and depression, Arthritis, degenerative, Atrial fibrillation ([place]), Cerebrovascular disease, Chronic kidney disease, Chronic low back pain, Complication of anesthesia (1970's), Diverticulosis, Dyslipidemia, Fibromyalgia, GERD (gastroesophageal reflux disease), HTN (hypertension), IBS (irritable bowel syndrome), Mild mitral valve prolapse, Obesity, OSA (obstructive sleep apnea), Peripheral neuropathy, Peripheral vascular disease ([place]), Positional vertigo, Scoliosis, Spina bifida occulta, Stroke ([place]), and Varicose vein. here with:  1: Neuropathy  -Continue gabapentin 600 mg 3 times a day -Advised if symptoms worsen or  she develops new symptoms she should let us know -Follow-up in 1 year or sooner if needed   I spent 15  minutes of face-to-face and non-face-to-face time with patient.  This included  previsit chart review, lab review, study review, order entry, electronic health record documentation, patient education.    Ward Givens, MSN, NP-C 08/21/2019, 10:16 AM [facility] [address] [place], [place] [address] [phone]

## 2019-09-04 ENCOUNTER — Encounter: Payer: Self-pay | Admitting: Adult Health

## 2019-09-04 MED ORDER — GABAPENTIN 600 MG PO TABS: 600.0000 mg | ORAL_TABLET | Freq: Three times a day (TID) | ORAL | 3 refills | Status: DC

## 2019-09-04 NOTE — Telephone Encounter (Signed)
I called [facility] and relayed pt to get a mail order, [facility] scripts.  Verbalized understanding.

## 2019-10-03 ENCOUNTER — Ambulatory Visit: Payer: Medicare Other | Admitting: Podiatry

## 2019-10-03 ENCOUNTER — Other Ambulatory Visit: Payer: Self-pay

## 2019-10-03 ENCOUNTER — Encounter: Payer: Self-pay | Admitting: Podiatry

## 2019-10-03 VITALS — Temp 97.7°F

## 2019-10-03 DIAGNOSIS — M79676 Pain in unspecified toe(s): Secondary | ICD-10-CM

## 2019-10-03 DIAGNOSIS — G63 Polyneuropathy in diseases classified elsewhere: Secondary | ICD-10-CM | POA: Diagnosis not present

## 2019-10-03 DIAGNOSIS — B351 Tinea unguium: Secondary | ICD-10-CM

## 2019-10-03 DIAGNOSIS — D689 Coagulation defect, unspecified: Secondary | ICD-10-CM

## 2019-10-03 DIAGNOSIS — N182 Chronic kidney disease, stage 2 (mild): Secondary | ICD-10-CM

## 2019-10-03 NOTE — Progress Notes (Signed)
This patient returns to my office for at risk foot care.  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  This patient is unable to cut nails herself since the patient cannot reach her nails.These nails are painful walking and wearing shoes.  This patient presents for at risk foot care today.  General Appearance  Alert, conversant and in no acute stress.  Vascular  Dorsalis pedis and posterior tibial  pulses are palpable  bilaterally.  Capillary return is within normal limits  bilaterally. Temperature is within normal limits  bilaterally.  Neurologic  Senn-Weinstein monofilament wire test within normal limits  bilaterally. Muscle power within normal limits bilaterally.  Nails Thick disfigured discolored nails with subungual debris  from second  to fifth toes bilaterally. No evidence of bacterial infection or drainage bilaterally.  Orthopedic  No limitations of motion  feet .  No crepitus or effusions noted.  No bony pathology or digital deformities noted.  Skin  normotropic skin with no porokeratosis noted bilaterally.  No signs of infections or ulcers noted.     Onychomycosis  Pain in right toes  Pain in left toes  Consent was obtained for treatment procedures.   Mechanical debridement of nails 2-5  bilaterally performed with a nail nipper.  Filed with dremel without incident.    Return office visit   9 weeks                   Told patient to return for periodic foot care and evaluation due to potential at risk complications.   Almena Hokenson DPM  

## 2019-10-22 ENCOUNTER — Ambulatory Visit: Payer: Medicare Other | Admitting: Dermatology

## 2019-10-22 ENCOUNTER — Encounter: Payer: Self-pay | Admitting: Dermatology

## 2019-10-22 ENCOUNTER — Other Ambulatory Visit: Payer: Self-pay

## 2019-10-22 DIAGNOSIS — D229 Melanocytic nevi, unspecified: Secondary | ICD-10-CM

## 2019-10-22 DIAGNOSIS — L729 Follicular cyst of the skin and subcutaneous tissue, unspecified: Secondary | ICD-10-CM | POA: Diagnosis not present

## 2019-10-22 DIAGNOSIS — Z808 Family history of malignant neoplasm of other organs or systems: Secondary | ICD-10-CM

## 2019-10-22 DIAGNOSIS — D225 Melanocytic nevi of trunk: Secondary | ICD-10-CM

## 2019-10-22 DIAGNOSIS — Z1283 Encounter for screening for malignant neoplasm of skin: Secondary | ICD-10-CM

## 2019-10-22 DIAGNOSIS — L820 Inflamed seborrheic keratosis: Secondary | ICD-10-CM | POA: Diagnosis not present

## 2019-10-22 DIAGNOSIS — L814 Other melanin hyperpigmentation: Secondary | ICD-10-CM

## 2019-10-22 DIAGNOSIS — I8393 Asymptomatic varicose veins of bilateral lower extremities: Secondary | ICD-10-CM

## 2019-10-22 DIAGNOSIS — L578 Other skin changes due to chronic exposure to nonionizing radiation: Secondary | ICD-10-CM

## 2019-10-22 DIAGNOSIS — D1801 Hemangioma of skin and subcutaneous tissue: Secondary | ICD-10-CM | POA: Diagnosis not present

## 2019-10-22 DIAGNOSIS — L821 Other seborrheic keratosis: Secondary | ICD-10-CM

## 2019-10-22 NOTE — Progress Notes (Signed)
New Patient Visit  Subjective  Samantha Woodard is a 79 y.o. female who presents for the following: New Patient (Initial Visit) (Would like a TBSE today, has had some dark spots, scaly spots, and rough area she would like to have looked at. No pain or itching associated with any of these areas. No history of skin cancer..Has no personal history of any skin cancers, but does have a family history of skin cancer. Thinks perhaps a MM but not sure.).  She has a few spots that get irritated by her clothing.  The following portions of the chart were reviewed this encounter and updated as appropriate:      Review of Systems:  No other skin or systemic complaints except as noted in HPI or Assessment and Plan.  Objective  Well appearing patient in no apparent distress; mood and affect are within normal limits.  A full examination was performed including scalp, head, eyes, ears, nose, lips, neck, chest, axillae, abdomen, back, buttocks, bilateral upper extremities, bilateral lower extremities, hands, feet, fingers, toes, fingernails, and toenails. All findings within normal limits unless otherwise noted below.  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    Acne/Milia surgery - Right Spinal Upper Back Procedure risks and benefits were discussed with the patient and verbal consent was obtained. Following prep of the skin on the right Spinal Upper Back with an alcohol swab, extraction of comedones was performed with two cotton swabs. Vaseline ointment was applied to each site. The patient tolerated the procedure well.  Inflamed seborrheic keratosis (5) Left Inguinal  Crease, Left abdomen, Right abdomen, Sternal notch, Left lateral thigh  Cryotherapy today Prior to procedure, discussed risks of blister formation, small wound, skin dyspigmentation, or rare scar following cryotherapy.    Destruction of lesion - Left Inguinal Crease, Left abdomen, Right abdomen, Sternal notch, Left lateral thigh  Destruction method: cryotherapy   Informed consent: discussed and consent obtained   Lesion destroyed using liquid nitrogen: Yes   Region frozen until ice ball extended beyond lesion: Yes   Outcome: patient tolerated procedure well with no complications   Post-procedure details: wound care instructions given    Nevus Right Spinal Mid Upper Back  Benign-appearing.  Observation.  Call clinic for new or changing moles.  Recommend daily use of broad spectrum spf 30+ sunscreen to sun-exposed areas.    Seborrheic keratosis Right Lower Leg - Anterior  Benign, observe.   Eucerin roughness relief spot treatment cream qd/bid if scaly    Lentigines - Scattered tan macules - Discussed due to sun exposure - Benign, observe - Call for any changes  Seborrheic Keratoses - Stuck-on, waxy, tan-brown papules and plaques  - Discussed benign etiology and prognosis. - Observe - Call for any changes  Melanocytic Nevi - Tan-brown and/or pink-flesh-colored symmetric macules and papules - Benign appearing on exam today - Observation - Call clinic for new or changing moles - Recommend daily use of broad spectrum spf 30+ sunscreen to sun-exposed areas.   Hemangiomas - Red papules - Discussed benign nature - Observe - Call for any changes  Actinic Damage - diffuse scaly erythematous macules with underlying dyspigmentation -  Recommend daily broad spectrum sunscreen SPF 30+ to sun-exposed areas, reapply every 2 hours as needed.  - Call for new or changing lesions.  Varicose Veins - Dilated blue, purple or red veins at the lower extremities - Reassured - These  can be treated by sclerotherapy (a procedure to inject a medicine into the veins to make them disappear) if desired, but the treatment is not covered by insurance   Skin cancer screening performed today.    Return in about 1 year (around 10/21/2020), or if symptoms worsen or fail to improve, for TBSE.  I, Donzetta Kohut, CMA, am acting as scribe for Brendolyn Patty, MD .  Documentation: I have reviewed the above documentation for accuracy and completeness, and I agree with the above.  Brendolyn Patty MD

## 2019-10-22 NOTE — Patient Instructions (Addendum)
Recommend daily broad spectrum sunscreen SPF 30+ to sun-exposed areas, reapply every 2 hours as needed. Call for new or changing lesions.  Cryotherapy Aftercare  . Wash gently with soap and water everyday.   Marland Kitchen Apply Vaseline and Band-Aid daily until healed.  Seborrheic Keratoses - Stuck-on, waxy, tan-brown papules and plaques  - Discussed benign etiology and prognosis. - Observe - Call for any changes

## 2019-12-05 ENCOUNTER — Encounter: Payer: Self-pay | Admitting: Podiatry

## 2019-12-05 ENCOUNTER — Ambulatory Visit: Payer: Medicare Other | Admitting: Podiatry

## 2019-12-05 ENCOUNTER — Other Ambulatory Visit: Payer: Self-pay

## 2019-12-05 DIAGNOSIS — G63 Polyneuropathy in diseases classified elsewhere: Secondary | ICD-10-CM

## 2019-12-05 DIAGNOSIS — B351 Tinea unguium: Secondary | ICD-10-CM | POA: Diagnosis not present

## 2019-12-05 DIAGNOSIS — M79676 Pain in unspecified toe(s): Secondary | ICD-10-CM

## 2019-12-05 DIAGNOSIS — N182 Chronic kidney disease, stage 2 (mild): Secondary | ICD-10-CM

## 2019-12-05 DIAGNOSIS — D689 Coagulation defect, unspecified: Secondary | ICD-10-CM

## 2019-12-05 NOTE — Progress Notes (Signed)
This patient returns to my office for at risk foot care.  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  This patient is unable to cut nails herself since the patient cannot reach her nails.These nails are painful walking and wearing shoes.  This patient presents for at risk foot care today.  General Appearance  Alert, conversant and in no acute stress.  Vascular  Dorsalis pedis and posterior tibial  pulses are palpable  bilaterally.  Capillary return is within normal limits  bilaterally. Temperature is within normal limits  bilaterally.  Neurologic  Senn-Weinstein monofilament wire test within normal limits  bilaterally. Muscle power within normal limits bilaterally.  Nails Thick disfigured discolored nails with subungual debris  from second  to fifth toes bilaterally. No evidence of bacterial infection or drainage bilaterally.  Orthopedic  No limitations of motion  feet .  No crepitus or effusions noted.  No bony pathology or digital deformities noted.  Skin  normotropic skin with no porokeratosis noted bilaterally.  No signs of infections or ulcers noted.     Onychomycosis  Pain in right toes  Pain in left toes  Consent was obtained for treatment procedures.   Mechanical debridement of nails 2-5  bilaterally performed with a nail nipper.  Filed with dremel without incident.    Return office visit   9 weeks                   Told patient to return for periodic foot care and evaluation due to potential at risk complications.   Kathy Wares DPM  

## 2020-02-13 ENCOUNTER — Ambulatory Visit: Payer: Medicare Other | Admitting: Podiatry

## 2020-02-13 ENCOUNTER — Other Ambulatory Visit: Payer: Self-pay

## 2020-02-13 ENCOUNTER — Encounter: Payer: Self-pay | Admitting: Podiatry

## 2020-02-13 DIAGNOSIS — N182 Chronic kidney disease, stage 2 (mild): Secondary | ICD-10-CM

## 2020-02-13 DIAGNOSIS — D689 Coagulation defect, unspecified: Secondary | ICD-10-CM

## 2020-02-13 DIAGNOSIS — B351 Tinea unguium: Secondary | ICD-10-CM

## 2020-02-13 DIAGNOSIS — G63 Polyneuropathy in diseases classified elsewhere: Secondary | ICD-10-CM | POA: Diagnosis not present

## 2020-02-13 DIAGNOSIS — M79676 Pain in unspecified toe(s): Secondary | ICD-10-CM

## 2020-02-13 NOTE — Progress Notes (Signed)
This patient returns to my office for at risk foot care.  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  This patient is unable to cut nails herself since the patient cannot reach her nails.These nails are painful walking and wearing shoes.  This patient presents for at risk foot care today.  General Appearance  Alert, conversant and in no acute stress.  Vascular  Dorsalis pedis and posterior tibial  pulses are palpable  bilaterally.  Capillary return is within normal limits  bilaterally. Temperature is within normal limits  bilaterally.  Neurologic  Senn-Weinstein monofilament wire test within normal limits  bilaterally. Muscle power within normal limits bilaterally.  Nails Thick disfigured discolored nails with subungual debris  from second  to fifth toes bilaterally. No evidence of bacterial infection or drainage bilaterally.  Orthopedic  No limitations of motion  feet .  No crepitus or effusions noted.  No bony pathology or digital deformities noted.  Skin  normotropic skin with no porokeratosis noted bilaterally.  No signs of infections or ulcers noted.     Onychomycosis  Pain in right toes  Pain in left toes  Consent was obtained for treatment procedures.   Mechanical debridement of nails 2-5  bilaterally performed with a nail nipper.  Filed with dremel without incident.    Return office visit   9 weeks                   Told patient to return for periodic foot care and evaluation due to potential at risk complications.   Adelei Scobey DPM  

## 2020-02-24 ENCOUNTER — Ambulatory Visit: Payer: Medicare Other | Admitting: Podiatry

## 2020-03-02 ENCOUNTER — Ambulatory Visit: Payer: Medicare Other | Admitting: Podiatry

## 2020-03-04 ENCOUNTER — Other Ambulatory Visit: Payer: Self-pay | Admitting: Obstetrics and Gynecology

## 2020-03-04 DIAGNOSIS — Z1231 Encounter for screening mammogram for malignant neoplasm of breast: Secondary | ICD-10-CM

## 2020-03-31 ENCOUNTER — Other Ambulatory Visit: Payer: Self-pay

## 2020-03-31 ENCOUNTER — Ambulatory Visit (INDEPENDENT_AMBULATORY_CARE_PROVIDER_SITE_OTHER): Payer: Medicare Other | Admitting: Obstetrics and Gynecology

## 2020-03-31 ENCOUNTER — Encounter: Payer: Self-pay | Admitting: Obstetrics and Gynecology

## 2020-03-31 VITALS — BP 120/80 | Ht 68.0 in | Wt 223.0 lb

## 2020-03-31 DIAGNOSIS — Z01419 Encounter for gynecological examination (general) (routine) without abnormal findings: Secondary | ICD-10-CM

## 2020-03-31 DIAGNOSIS — Z1239 Encounter for other screening for malignant neoplasm of breast: Secondary | ICD-10-CM | POA: Diagnosis not present

## 2020-03-31 NOTE — Progress Notes (Signed)
Gynecology Annual Exam  PCP: Antony Contras, MD  Chief Complaint:  Chief Complaint  Patient presents with  . Gynecologic Exam    History of Present Illness:Patient is a 79 y.o. [id number] presents for annual exam. The patient has no complaints today.   LMP: No LMP recorded. Patient has had a hysterectomy. No PMB  The patient does perform self breast exams.  There is no notable family history of breast or ovarian cancer in her family.  The patient wears seatbelts: yes.   The patient has regular exercise: not asked.    The patient denies current symptoms of depression.     Review of Systems: Review of Systems  Constitutional: Negative for chills and fever.  HENT: Negative for congestion.   Respiratory: Negative for cough and shortness of breath.   Cardiovascular: Negative for chest pain and palpitations.  Gastrointestinal: Negative for abdominal pain, constipation, diarrhea, heartburn, nausea and vomiting.  Genitourinary: Negative for dysuria, frequency and urgency.  Musculoskeletal: Positive for back pain, joint pain and myalgias.  Skin: Negative for itching and rash.  Neurological: Negative for dizziness and headaches.  Endo/Heme/Allergies: Negative for polydipsia.  Psychiatric/Behavioral: Negative for depression.    Past Medical History:  Patient Active Problem List   Diagnosis Date Noted  . Chest pain with moderate risk for cardiac etiology 07/24/2018  . Morbid obesity ([place]) 07/24/2018  . Mixed hyperlipidemia 07/24/2018  . Stage 2 chronic kidney disease 07/24/2018  . Gastroesophageal reflux disease without esophagitis 07/24/2018  . Hiatal hernia 07/24/2018  . Primary osteoarthritis of left knee 11/27/2016  . Hx of colonic polyps 03/27/2014  . Varicose veins of lower extremities with other complications 06/08/7251  . Platelet inhibition due to Plavix 12/05/2013  . Fecal incontinence 11/26/2013  . Internal prolapsed hemorrhoids 11/26/2013  . TIA (transient  ischemic attack) 10/14/2013  . Polyneuropathy in other diseases classified elsewhere ([place]) 10/14/2013    Past Surgical History:  Past Surgical History:  Procedure Laterality Date  . ABDOMINAL HYSTERECTOMY    . CARDIAC CATHETERIZATION  1990's   no blockage   . CHOLECYSTECTOMY    . ENDOVENOUS ABLATION SAPHENOUS VEIN W/ LASER  10/22/2007  . gallbladder resection    . MINOR HEMORRHOIDECTOMY    . TONSILLECTOMY    . TOTAL KNEE ARTHROPLASTY Left 11/28/2016   Procedure: TOTAL KNEE ARTHROPLASTY;  Surgeon: Frederik Pear, MD;  Location: [facility];  Service: Orthopedics;  Laterality: Left;    Gynecologic History:  No LMP recorded. Patient has had a hysterectomy. Last Pap: Results were: 03/25/2019 no abnormalities  Last mammogram: 04/01/2019 Results were: Gillian Shields I  Obstetric History: [id number]  Family History:  Family History  Problem Relation Age of Onset  . Cancer Mother   . Hypertension Mother   . Varicose Veins Mother   . Heart attack Mother   . Heart attack Father   . Cancer Father   . Heart disease Father   . Hypertension Father   . Diabetes Sister   . Hypertension Sister   . Hyperlipidemia Sister   . Varicose Veins Sister   . Hypertension Brother   . Hyperlipidemia Brother   . Cerebrovascular Disease Maternal Grandmother   . Stroke Paternal Grandmother   . Hypertension Sister   . Hyperlipidemia Sister   . Varicose Veins Sister   . Carpal tunnel syndrome Sister   . Breast cancer Neg Hx     Social History:  Social History   Socioeconomic History  . Marital status: Married    Spouse  name: Not on file  . Number of children: 2  . Years of education: college 2  . Highest education level: Not on file  Occupational History  . Occupation: Retired  Tobacco Use  . Smoking status: Never Smoker  . Smokeless tobacco: Never Used  Vaping Use  . Vaping Use: Never used  Substance and Sexual Activity  . Alcohol use: No    Comment: on occasion  . Drug use: No  . Sexual activity:  Not on file  Other Topics Concern  . Not on file  Social History Narrative   Patient is right handed.   Patient does not drink caffeine.   Social Determinants of Health   Financial Resource Strain:   . Difficulty of Paying Living Expenses: Not on file  Food Insecurity:   . Worried About Charity fundraiser in the Last Year: Not on file  . Ran Out of Food in the Last Year: Not on file  Transportation Needs:   . Lack of Transportation (Medical): Not on file  . Lack of Transportation (Non-Medical): Not on file  Physical Activity:   . Days of Exercise per Week: Not on file  . Minutes of Exercise per Session: Not on file  Stress:   . Feeling of Stress : Not on file  Social Connections:   . Frequency of Communication with Friends and Family: Not on file  . Frequency of Social Gatherings with Friends and Family: Not on file  . Attends Religious Services: Not on file  . Active Member of Clubs or Organizations: Not on file  . Attends Archivist Meetings: Not on file  . Marital Status: Not on file  Intimate Partner Violence:   . Fear of Current or Ex-Partner: Not on file  . Emotionally Abused: Not on file  . Physically Abused: Not on file  . Sexually Abused: Not on file    Allergies:  Allergies  Allergen Reactions  . Iodine Hives    IV CONTRAST  . Neomycin Hives and Rash  . Tape Hives and Rash    Medications: Prior to Admission medications   Medication Sig Start Date End Date Taking? Authorizing Provider  acetaminophen (TYLENOL) 500 MG tablet Take 500 mg by mouth every 6 (six) hours as needed.    Yes [provider]  aspirin EC 81 MG tablet Take 81 mg by mouth daily with supper.   Yes [provider]  carisoprodol (SOMA) 350 MG tablet Take 350 mg by mouth. Take 1/2 - 1 as needed   Yes [provider]  celecoxib (CELEBREX) 200 MG capsule Take 200 mg by mouth daily with lunch.  10/04/13  Yes [provider]  cetirizine (ZYRTEC) 10 MG  tablet Take 10 mg by mouth at bedtime.   Yes [provider]  clopidogrel (PLAVIX) 75 MG tablet Take 0.5 tablets (37.5 mg total) by mouth daily. 03/14/19  Yes Ward Givens, NP  fluticasone (FLONASE) 50 MCG/ACT nasal spray Place 2 sprays into both nostrils at bedtime.  08/16/13  Yes [provider]  gabapentin (NEURONTIN) 600 MG tablet Take 1 tablet (600 mg total) by mouth 3 (three) times daily. 09/04/19  Yes Ward Givens, NP  hydrochlorothiazide (HYDRODIURIL) 12.5 MG tablet Take 12.5 mg by mouth daily with lunch.  07/18/13  Yes [provider]  lisinopril (PRINIVIL,ZESTRIL) 5 MG tablet Take 2.5 mg by mouth daily with lunch.  07/17/13  Yes [provider]  Multiple Vitamin (MULTIVITAMIN WITH MINERALS) TABS tablet Take 1  tablet by mouth daily with supper. WOMEN'S 50+   Yes [provider]  pantoprazole (PROTONIX) 40 MG tablet Take 40 mg by mouth daily before breakfast. 30 MINS TO 1 HOUR BEFORE EATING. 07/26/13  Yes [provider]  Probiotic Product (ALIGN) 4 MG CAPS Take 4 mg by mouth daily with lunch.   Yes [provider]  Simethicone (PHAZYME) 180 MG CAPS Take 180 mg by mouth 2 (two) times daily as needed (FOR BLOATING/GAS).   Yes [provider]  simvastatin (ZOCOR) 20 MG tablet Take 20 mg by mouth every evening.  09/03/13  Yes [provider]  tetrahydrozoline-zinc (VISINE-AC) 0.05-0.25 % ophthalmic solution Place 2 drops into both eyes 3 (three) times daily as needed (for redness/itchy eyes.).   Yes [provider]  Wheat Dextrin (BENEFIBER) POWD Take 14.2 g by mouth 2 (two) times daily.    Yes [provider]    Physical Exam Vitals: Blood pressure 120/80, height 5\' 8"  (1.727 m), weight 223 lb (101.2 kg).  General: NAD HEENT: normocephalic, anicteric Thyroid: no enlargement, no palpable nodules Pulmonary: No increased work of breathing, CTAB Cardiovascular: RRR, distal pulses 2+ Breast:  Breast symmetrical, no tenderness, no palpable nodules or masses, no skin or nipple retraction present, no nipple discharge.  No axillary or supraclavicular lymphadenopathy. Abdomen: NABS, soft, non-tender, non-distended.  Umbilicus without lesions.  No hepatomegaly, splenomegaly or masses palpable. No evidence of hernia  Genitourinary:  External: Normal external female genitalia.  Normal urethral meatus, normal Bartholin's and Skene's glands.    Vagina: Normal vaginal mucosa, no evidence of prolapse.    Cervix: surgically absent  Uterus: surgically absent  Adnexa: ovaries non-enlarged, no adnexal masses  Rectal: deferred  Lymphatic: no evidence of inguinal lymphadenopathy Extremities: no edema, erythema, or tenderness Neurologic: Grossly intact Psychiatric: mood appropriate, affect full  Female chaperone present for pelvic and breast  portions of the physical exam  Immunization History  Administered Date(s) Administered  . Influenza-Unspecified 03/24/2020  . Moderna SARS-COVID-2 Vaccination 06/18/2019, 07/23/2019  . Pneumococcal Polysaccharide-23 06/07/2003  . Pneumococcal-Unspecified 06/14/2016  . Tdap 01/05/2010  . Zoster Recombinat (Shingrix) 03/06/2017, 05/26/2017      Assessment: 79 y.o. [id number] routine annual exam  Plan: Problem List Items Addressed This Visit    None    Visit Diagnoses    Encounter for gynecological examination without abnormal finding    -  Primary   Breast screening       Relevant Orders   MM 3D SCREEN BREAST BILATERAL      1) Mammogram - recommend yearly screening mammogram.  Mammogram Was ordered today  2) STI screening  was notoffered and therefore not obtained  3) ASCCP guidelines and rational discussed.  Patient opts for every 2 year screening interval  4) Osteoporosis  - per USPTF routine screening DEXA at age 3 - UTD 08/29/2017  5) Routine healthcare maintenance including cholesterol, diabetes screening discussed managed by  PCP  6) Return in about 1 year (around 03/31/2021) for annual.   Malachy Mood, MD Mos[facility] Pigeon, [facility] 03/31/2020, 1:33 PM

## 2020-03-31 NOTE — Patient Instructions (Signed)
[facility] [address] [place] [address]  [facility]  [address] [place] [address]  Phone: [phone]  

## 2020-04-01 ENCOUNTER — Ambulatory Visit
Admission: RE | Admit: 2020-04-01 | Discharge: 2020-04-01 | Disposition: A | Discharge status: Home / Self Care | Payer: Medicare Other | Source: Ambulatory Visit | Attending: Obstetrics and Gynecology | Admitting: Obstetrics and Gynecology

## 2020-04-01 DIAGNOSIS — Z1231 Encounter for screening mammogram for malignant neoplasm of breast: Secondary | ICD-10-CM | POA: Diagnosis not present

## 2020-04-13 ENCOUNTER — Other Ambulatory Visit: Payer: Self-pay | Admitting: Adult Health

## 2020-04-13 NOTE — Addendum Note (Signed)
Addended by: Brandon Melnick on: 04/13/2020 04:05 PM   Modules accepted: Orders

## 2020-04-13 NOTE — Telephone Encounter (Signed)
Pt. Is requesting refill for clopidogrel (PLAVIX) 75 MG tablet.  Pharmacy: [facility]

## 2020-04-14 MED ORDER — CLOPIDOGREL BISULFATE 75 MG PO TABS: 37.5000 mg | ORAL_TABLET | Freq: Every day | ORAL | 1 refills | Status: DC

## 2020-04-16 ENCOUNTER — Encounter: Payer: Self-pay | Admitting: Podiatry

## 2020-04-16 ENCOUNTER — Other Ambulatory Visit: Payer: Self-pay

## 2020-04-16 ENCOUNTER — Ambulatory Visit: Payer: Medicare Other | Admitting: Podiatry

## 2020-04-16 DIAGNOSIS — N182 Chronic kidney disease, stage 2 (mild): Secondary | ICD-10-CM | POA: Diagnosis not present

## 2020-04-16 DIAGNOSIS — G63 Polyneuropathy in diseases classified elsewhere: Secondary | ICD-10-CM | POA: Diagnosis not present

## 2020-04-16 DIAGNOSIS — D689 Coagulation defect, unspecified: Secondary | ICD-10-CM | POA: Diagnosis not present

## 2020-04-16 DIAGNOSIS — B351 Tinea unguium: Secondary | ICD-10-CM | POA: Diagnosis not present

## 2020-04-16 DIAGNOSIS — M79676 Pain in unspecified toe(s): Secondary | ICD-10-CM

## 2020-04-16 NOTE — Progress Notes (Signed)
This patient returns to my office for at risk foot care.  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  This patient is unable to cut nails herself since the patient cannot reach her nails.These nails are painful walking and wearing shoes.  This patient presents for at risk foot care today.  General Appearance  Alert, conversant and in no acute stress.  Vascular  Dorsalis pedis and posterior tibial  pulses are palpable  bilaterally.  Capillary return is within normal limits  bilaterally. Temperature is within normal limits  bilaterally.  Neurologic  Senn-Weinstein monofilament wire test within normal limits  bilaterally. Muscle power within normal limits bilaterally.  Nails Thick disfigured discolored nails with subungual debris  from second  to fifth toes bilaterally. No evidence of bacterial infection or drainage bilaterally.  Orthopedic  No limitations of motion  feet .  No crepitus or effusions noted.  No bony pathology or digital deformities noted.  Skin  normotropic skin with no porokeratosis noted bilaterally.  No signs of infections or ulcers noted.     Onychomycosis  Pain in right toes  Pain in left toes  Consent was obtained for treatment procedures.   Mechanical debridement of nails 2-5  bilaterally performed with a nail nipper.  Filed with dremel without incident.    Return office visit   9 weeks                   Told patient to return for periodic foot care and evaluation due to potential at risk complications.   Waino Mounsey DPM  

## 2020-06-06 HISTORY — PX: JOINT REPLACEMENT: SHX530

## 2020-07-13 ENCOUNTER — Telehealth: Payer: Self-pay | Admitting: Adult Health

## 2020-07-13 MED ORDER — GABAPENTIN 600 MG PO TABS: 600.0000 mg | ORAL_TABLET | Freq: Three times a day (TID) | ORAL | 3 refills | Status: DC

## 2020-07-13 NOTE — Telephone Encounter (Signed)
Pt request refill gabapentin (NEURONTIN) 600 MG tablet at [facility] 

## 2020-07-20 ENCOUNTER — Other Ambulatory Visit: Payer: Self-pay

## 2020-07-20 ENCOUNTER — Ambulatory Visit: Payer: Medicare Other | Admitting: Podiatry

## 2020-07-20 ENCOUNTER — Encounter: Payer: Self-pay | Admitting: Podiatry

## 2020-07-20 DIAGNOSIS — G63 Polyneuropathy in diseases classified elsewhere: Secondary | ICD-10-CM

## 2020-07-20 DIAGNOSIS — B351 Tinea unguium: Secondary | ICD-10-CM | POA: Diagnosis not present

## 2020-07-20 DIAGNOSIS — N182 Chronic kidney disease, stage 2 (mild): Secondary | ICD-10-CM | POA: Diagnosis not present

## 2020-07-20 DIAGNOSIS — M79676 Pain in unspecified toe(s): Secondary | ICD-10-CM

## 2020-07-20 DIAGNOSIS — D689 Coagulation defect, unspecified: Secondary | ICD-10-CM | POA: Diagnosis not present

## 2020-07-20 NOTE — Progress Notes (Signed)
This patient returns to my office for at risk foot care.  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  This patient is unable to cut nails herself since the patient cannot reach her nails.These nails are painful walking and wearing shoes.  This patient presents for at risk foot care today.  General Appearance  Alert, conversant and in no acute stress.  Vascular  Dorsalis pedis and posterior tibial  pulses are palpable  bilaterally.  Capillary return is within normal limits  bilaterally. Temperature is within normal limits  bilaterally.  Neurologic  Senn-Weinstein monofilament wire test within normal limits  bilaterally. Muscle power within normal limits bilaterally.  Nails Thick disfigured discolored nails with subungual debris  from second  to fifth toes bilaterally. No evidence of bacterial infection or drainage bilaterally.  Orthopedic  No limitations of motion  feet .  No crepitus or effusions noted.  No bony pathology or digital deformities noted.  Skin  normotropic skin with no porokeratosis noted bilaterally.  No signs of infections or ulcers noted.     Onychomycosis  Pain in right toes  Pain in left toes  Consent was obtained for treatment procedures.   Mechanical debridement of nails 2-5  bilaterally performed with a nail nipper.  Filed with dremel without incident.    Return office visit   9 weeks                   Told patient to return for periodic foot care and evaluation due to potential at risk complications.   Ambre Kobayashi DPM  

## 2020-07-30 ENCOUNTER — Telehealth: Payer: Self-pay | Admitting: *Deleted

## 2020-07-30 ENCOUNTER — Encounter: Payer: Self-pay | Admitting: Adult Health

## 2020-07-30 NOTE — Telephone Encounter (Signed)
Noted  

## 2020-07-30 NOTE — Telephone Encounter (Signed)
Pt reported to [place] about vaccinations given and wanted [place] to put in record.

## 2020-08-20 ENCOUNTER — Encounter: Payer: Self-pay | Admitting: Adult Health

## 2020-08-20 ENCOUNTER — Ambulatory Visit: Payer: Medicare Other | Admitting: Adult Health

## 2020-08-20 ENCOUNTER — Other Ambulatory Visit: Payer: Self-pay

## 2020-08-20 VITALS — BP 140/77 | HR 73 | Ht 68.0 in | Wt 231.0 lb

## 2020-08-20 DIAGNOSIS — G63 Polyneuropathy in diseases classified elsewhere: Secondary | ICD-10-CM

## 2020-08-20 NOTE — Progress Notes (Signed)
I have read the note, and I agree with the clinical assessment and plan.  Wyeth Hoffer K Tamber Burtch   

## 2020-08-20 NOTE — Progress Notes (Signed)
PATIENT: Samantha Woodard DOB: [birth year]  REASON FOR VISIT: follow up HISTORY FROM: patient  HISTORY OF PRESENT ILLNESS: Today 08/20/20:  Samantha Woodard is an 80 year old female with a history of polyneuropathy.  She returns today for follow-up.  She remains on gabapentin 600 mg 3 times a day.  States that this continues to work well for her.  On occasion she continues to have sharp shooting pains in the feet but it is manageable.  Denies any significant change with her gait or balance.  Denies any falls.  She returns today for an evaluation.  HISTORY 08/21/19: Samantha Woodard is an 80 year old female with a history of neuropathy.  She joins me today for virtual visit.  Overall she is doing well.  Reports that gabapentin continues to work well for her.  She takes 600 mg 3 times a day.  On occasion she may have sharp shooting pains in the feet but this is not frequent.  She denies any new symptoms.  Patient was unable to get her videocamera to work therefore I cannot visualize the patient during our virtual visit.  REVIEW OF SYSTEMS: Out of a complete 14 system review of symptoms, the patient complains only of the following symptoms, and all other reviewed systems are negative.  ALLERGIES: Allergies  Allergen Reactions  . Iodine Hives    IV CONTRAST  . Neomycin Hives and Rash  . Tape Hives and Rash    HOME MEDICATIONS: Outpatient Medications Prior to Visit  Medication Sig Dispense Refill  . acetaminophen (TYLENOL) 500 MG tablet Take 500 mg by mouth every 6 (six) hours as needed.     Marland Kitchen aspirin EC 81 MG tablet Take 81 mg by mouth daily with supper.    . carisoprodol (SOMA) 350 MG tablet Take 350 mg by mouth. Take 1/2 - 1 as needed    . celecoxib (CELEBREX) 200 MG capsule Take 200 mg by mouth daily with lunch.     . cetirizine (ZYRTEC) 10 MG tablet Take 10 mg by mouth at bedtime.    . clopidogrel (PLAVIX) 75 MG tablet Take 0.5 tablets (37.5 mg total) by mouth daily. 135 tablet 1  .  fluticasone (FLONASE) 50 MCG/ACT nasal spray Place 2 sprays into both nostrils at bedtime.     . gabapentin (NEURONTIN) 600 MG tablet Take 1 tablet (600 mg total) by mouth 3 (three) times daily. 270 tablet 3  . hydrochlorothiazide (HYDRODIURIL) 12.5 MG tablet Take 12.5 mg by mouth daily with lunch.     . lisinopril (PRINIVIL,ZESTRIL) 5 MG tablet Take 2.5 mg by mouth daily with lunch.     . Multiple Vitamin (MULTIVITAMIN WITH MINERALS) TABS tablet Take 1 tablet by mouth daily with supper. WOMEN'S 50+    . pantoprazole (PROTONIX) 40 MG tablet Take 40 mg by mouth daily before breakfast. 30 MINS TO 1 HOUR BEFORE EATING.    . Probiotic Product (ALIGN) 4 MG CAPS Take 4 mg by mouth daily with lunch.    . Simethicone 180 MG CAPS Take 180 mg by mouth 2 (two) times daily as needed (FOR BLOATING/GAS).    Marland Kitchen simvastatin (ZOCOR) 20 MG tablet Take 20 mg by mouth every evening.     Marland Kitchen tetrahydrozoline-zinc (VISINE-AC) 0.05-0.25 % ophthalmic solution Place 2 drops into both eyes 3 (three) times daily as needed (for redness/itchy eyes.).    Marland Kitchen Wheat Dextrin (BENEFIBER) POWD Take 14.2 g by mouth 2 (two) times daily.      No facility-administered medications prior to  visit.    PAST MEDICAL HISTORY: Past Medical History:  Diagnosis Date  . Anemia   . Anxiety and depression   . Arthritis, degenerative    scoliosis  . Atrial fibrillation ([place])    history of- pt. reports thats why she is on Plavix. But the afib is resolved.    . Cerebrovascular disease   . Chronic kidney disease   . Chronic low back pain   . Complication of anesthesia 1970's   post op- vertigo, odd sensation with " feeling like I was going to fall", also reports that the anesth. team remarked that she had a quick" response to anesth.    . Diverticulosis   . Dyslipidemia   . Fibromyalgia   . GERD (gastroesophageal reflux disease)   . HTN (hypertension)   . IBS (irritable bowel syndrome)   . Mild mitral valve prolapse   . Obesity   . OSA  (obstructive sleep apnea)    tried CPAP, then Bipap, now on concentrator  . Peripheral neuropathy    feet  . Peripheral vascular disease ([place])   . Positional vertigo   . Rosacea   . Rosacea   . Scoliosis    lumbar 5  . Spina bifida occulta   . Stroke [facility])    pt. reports that she had TIA's in the past & has been on Plavix ever since.   . Varicose vein     PAST SURGICAL HISTORY: Past Surgical History:  Procedure Laterality Date  . ABDOMINAL HYSTERECTOMY    . CARDIAC CATHETERIZATION  1990's   no blockage   . CHOLECYSTECTOMY    . ENDOVENOUS ABLATION SAPHENOUS VEIN W/ LASER  10/22/2007  . gallbladder resection    . MINOR HEMORRHOIDECTOMY    . TONSILLECTOMY    . 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 Cancer Mother   . Hypertension Mother   . Varicose Veins Mother   . Heart attack Mother   . Heart attack Father   . Cancer Father   . Heart disease Father   . Hypertension Father   . Diabetes Sister   . Hypertension Sister   . Hyperlipidemia Sister   . Varicose Veins Sister   . Hypertension Brother   . Hyperlipidemia Brother   . Cerebrovascular Disease Maternal Grandmother   . Stroke Paternal Grandmother   . Hypertension Sister   . Hyperlipidemia Sister   . Varicose Veins Sister   . Carpal tunnel syndrome Sister   . Breast cancer Neg Hx     SOCIAL HISTORY: Social History   Socioeconomic History  . Marital status: Married    Spouse name: Not on file  . Number of children: 2  . Years of education: college 2  . Highest education level: Not on file  Occupational History  . Occupation: Retired  Tobacco Use  . Smoking status: Never Smoker  . Smokeless tobacco: Never Used  Vaping Use  . Vaping Use: Never used  Substance and Sexual Activity  . Alcohol use: No     Comment: on occasion  . Drug use: No  . Sexual activity: Not on file  Other Topics Concern  . Not on file  Social History Narrative   Patient is right handed.   Patient does not drink caffeine.   Social Determinants of  Health   Financial Resource Strain: Not on file  Food Insecurity: Not on file  Transportation Needs: Not on file  Physical Activity: Not on file  Stress: Not on file  Social Connections: Not on file  Intimate Partner Violence: Not on file      PHYSICAL EXAM  Vitals:   08/20/20 1449  BP: 140/77  Pulse: 73  Weight: 231 lb (104.8 kg)  Height: 5\' 8"  (1.727 m)   Body mass index is 35.12 kg/m.  Generalized: Well developed, in no acute distress   Neurological examination  Mentation: Alert oriented to time, place, history taking. Follows all commands speech and language fluent Cranial nerve II-XII: Pupils were equal round reactive to light. Extraocular movements were full, visual field were full on confrontational test. Head turning and shoulder shrug  were normal and symmetric. Motor: The motor testing reveals 5 over 5 strength of all 4 extremities. Good symmetric motor tone is noted throughout.  Sensory: Sensory testing is intact to soft touch on all 4 extremities. No evidence of extinction is noted.  Coordination: Cerebellar testing reveals good finger-nose-finger and heel-to-shin bilaterally.  Gait and station: Gait is normal.  Reflexes: Deep tendon reflexes are symmetric and normal bilaterally.   DIAGNOSTIC DATA (LABS, IMAGING, TESTING) - I reviewed patient records, labs, notes, testing and imaging myself where available.  Lab Results  Component Value Date   WBC 9.0 11/30/2016   HGB 9.5 (L) 11/30/2016   HCT 30.3 (L) 11/30/2016   MCV 91.8 11/30/2016   PLT 281 11/30/2016      Component Value Date/Time   NA 134 (L) 11/29/2016 0538   K 3.9 11/29/2016 0538   CL 101 11/29/2016 0538   CO2 27 11/29/2016 0538   GLUCOSE 126 (H) 11/29/2016 0538   BUN 16  11/29/2016 0538   CREATININE 1.01 (H) 11/29/2016 0538   CALCIUM 8.5 (L) 11/29/2016 0538   GFRNONAA 52 (L) 11/29/2016 0538   GFRAA >60 11/29/2016 0538      ASSESSMENT AND PLAN 80 y.o. year old female  has a past medical history of Anemia, Anxiety and depression, Arthritis, degenerative, Atrial fibrillation ([place]), Cerebrovascular disease, Chronic kidney disease, Chronic low back pain, Complication of anesthesia (1970's), Diverticulosis, Dyslipidemia, Fibromyalgia, GERD (gastroesophageal reflux disease), HTN (hypertension), IBS (irritable bowel syndrome), Mild mitral valve prolapse, Obesity, OSA (obstructive sleep apnea), Peripheral neuropathy, Peripheral vascular disease ([place]), Positional vertigo, Rosacea, Rosacea, Scoliosis, Spina bifida occulta, Stroke ([place]), and Varicose vein. here with:  1.  Polyneuropathy  --Continue gabapentin 600 mg 3 times a day --Advised if symptoms worsen or she develops new symptoms she should let us know. --Follow-up in 1 year or sooner if needed   I spent 30 minutes of face-to-face and non-face-to-face time with patient.  This included previsit chart review, lab review, study review, order entry, electronic health record documentation, patient education.  Ward Givens, MSN, NP-C 08/20/2020, 2:49 PM [facility] [address], [place], [place] [address] [phone]

## 2020-08-20 NOTE — Patient Instructions (Signed)
Your Plan:  Continue gabapentin 600 mg 3 times a day If your symptoms worsen or you develop new symptoms please let us know.   Thank you for coming to see [place] at [facility]. I hope we have been able to provide you high quality care today.  You may receive a patient satisfaction survey over the next few weeks. We would appreciate your feedback and comments so that we may continue to improve ourselves and the health of our patients.

## 2020-10-19 ENCOUNTER — Ambulatory Visit: Payer: Medicare Other | Admitting: Podiatry

## 2020-10-20 ENCOUNTER — Ambulatory Visit: Payer: Medicare Other | Admitting: Dermatology

## 2020-10-20 ENCOUNTER — Other Ambulatory Visit: Payer: Self-pay

## 2020-10-20 DIAGNOSIS — D2239 Melanocytic nevi of other parts of face: Secondary | ICD-10-CM

## 2020-10-20 DIAGNOSIS — I872 Venous insufficiency (chronic) (peripheral): Secondary | ICD-10-CM

## 2020-10-20 DIAGNOSIS — L578 Other skin changes due to chronic exposure to nonionizing radiation: Secondary | ICD-10-CM

## 2020-10-20 DIAGNOSIS — D235 Other benign neoplasm of skin of trunk: Secondary | ICD-10-CM | POA: Diagnosis not present

## 2020-10-20 DIAGNOSIS — Z1283 Encounter for screening for malignant neoplasm of skin: Secondary | ICD-10-CM

## 2020-10-20 DIAGNOSIS — D229 Melanocytic nevi, unspecified: Secondary | ICD-10-CM

## 2020-10-20 DIAGNOSIS — L821 Other seborrheic keratosis: Secondary | ICD-10-CM

## 2020-10-20 DIAGNOSIS — D225 Melanocytic nevi of trunk: Secondary | ICD-10-CM | POA: Diagnosis not present

## 2020-10-20 DIAGNOSIS — D1800 Hemangioma unspecified site: Secondary | ICD-10-CM

## 2020-10-20 DIAGNOSIS — L820 Inflamed seborrheic keratosis: Secondary | ICD-10-CM | POA: Diagnosis not present

## 2020-10-20 DIAGNOSIS — L720 Epidermal cyst: Secondary | ICD-10-CM

## 2020-10-20 DIAGNOSIS — L814 Other melanin hyperpigmentation: Secondary | ICD-10-CM

## 2020-10-20 NOTE — Patient Instructions (Signed)

## 2020-10-20 NOTE — Progress Notes (Signed)
Follow-Up Visit   Subjective  Samantha Woodard is a 80 y.o. female who presents for the following: Annual Exam (Patient has noticed a lesion on the back of her L leg, and R lower abdomen that is irritated and she would like checked).  The following portions of the chart were reviewed this encounter and updated as appropriate:      Review of Systems:  No other skin or systemic complaints except as noted in HPI or Assessment and Plan.  Objective  Well appearing patient in no apparent distress; mood and affect are within normal limits.  A full examination was performed including scalp, head, eyes, ears, nose, lips, neck, chest, axillae, abdomen, back, buttocks, bilateral upper extremities, bilateral lower extremities, hands, feet, fingers, toes, fingernails, and toenails. All findings within normal limits unless otherwise noted below.  Objective  spinal mid upper back: 0.4 x 0.3 cm medium brown macule   L malar cheek: 0.5 cm light tan papule   Objective  Back x 2: Dilated pores.  Objective  R sternum: Firm blue papule   Objective  B/L lower leg: Dyspigmented patches involving the ankle and distal lower leg with associated lhour glass deformity and associated varicosities   Objective  R ant ankle: Waxy brown patch   Objective  L popliteal x 1, R lower abdomen x 1 (2): Erythematous keratotic or waxy stuck-on papule and plaque  Assessment & Plan  Nevus (2) L malar cheek; spinal mid upper back  Benign-appearing.  Observation.  Call clinic for new or changing lesions.  Recommend daily use of broad spectrum spf 30+ sunscreen to sun-exposed areas.    Dilated pore of [place] of back Back x 2  Benign-appearing.  Observation.  Call clinic for new or changing lesions.  Recommend daily use of broad spectrum spf 30+ sunscreen to sun-exposed areas.    Epidermal inclusion cyst R sternum  Benign-appearing. Exam most consistent with an epidermal inclusion cyst. Discussed that a  cyst is a benign growth that can grow over time and sometimes get irritated or inflamed. Recommend observation if it is not bothersome. Discussed option of surgical excision to remove it if it is growing, symptomatic, or other changes noted. Please call for new or changing lesions so they can be evaluated.    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  Recommend daily compression hose/stockings- easiest to put on first thing in morning, remove at bedtime.  Elevate legs as much as possible. Avoid salt/sodium rich foods.    Seborrheic keratosis R ant ankle  Benign, observe.    Inflamed seborrheic keratosis (2) L popliteal x 1, R lower abdomen x 1  Destruction of lesion - L popliteal x 1, R lower abdomen x 1  Destruction method: cryotherapy   Informed consent: discussed and consent obtained   Lesion destroyed using liquid nitrogen: Yes   Region frozen until ice ball extended beyond lesion: Yes   Outcome: patient tolerated procedure well with no complications   Post-procedure details: wound care instructions given     Lentigines - Scattered tan macules - Due to sun exposure - Benign-appering, observe - Recommend daily broad spectrum sunscreen SPF 30+ to sun-exposed areas, reapply every 2 hours as needed. - Call for any changes  Seborrheic Keratoses - Stuck-on, waxy, tan-brown papules and/or plaques  - Benign-appearing - Discussed benign etiology and prognosis. -  Observe - Call for any changes  Melanocytic Nevi - Tan-brown and/or pink-flesh-colored symmetric macules and papules - Benign appearing on exam today - Observation - Call clinic for new or changing moles - Recommend daily use of broad spectrum spf 30+ sunscreen to sun-exposed areas.   Hemangiomas - Red papules - Discussed benign nature - Observe -  Call for any changes  Actinic Damage - Chronic condition, secondary to cumulative UV/sun exposure - diffuse scaly erythematous macules with underlying dyspigmentation - Recommend daily broad spectrum sunscreen SPF 30+ to sun-exposed areas, reapply every 2 hours as needed.  - Staying in the shade or wearing long sleeves, sun glasses (UVA+UVB protection) and wide brim hats (4-inch brim around the entire circumference of the hat) are also recommended for sun protection.  - Call for new or changing lesions.  Varicose Veins - Dilated blue, purple or red veins at the lower extremities - Reassured - These can be treated by sclerotherapy (a procedure to inject a medicine into the veins to make them disappear) if desired, but the treatment is not covered by insurance  Skin cancer screening performed today.  Return in about 1 year (around 10/20/2021) for TBSE.  Luther Redo, CMA, am acting as scribe for Brendolyn Patty, MD .  Documentation: I have reviewed the above documentation for accuracy and completeness, and I agree with the above.  Brendolyn Patty MD

## 2020-10-28 DIAGNOSIS — I129 Hypertensive chronic kidney disease with stage 1 through stage 4 chronic kidney disease, or unspecified chronic kidney disease: Secondary | ICD-10-CM | POA: Insufficient documentation

## 2020-10-28 DIAGNOSIS — F3342 Major depressive disorder, recurrent, in full remission: Secondary | ICD-10-CM | POA: Insufficient documentation

## 2020-10-28 DIAGNOSIS — F419 Anxiety disorder, unspecified: Secondary | ICD-10-CM | POA: Insufficient documentation

## 2020-10-28 DIAGNOSIS — Z8673 Personal history of transient ischemic attack (TIA), and cerebral infarction without residual deficits: Secondary | ICD-10-CM | POA: Insufficient documentation

## 2020-10-28 DIAGNOSIS — R609 Edema, unspecified: Secondary | ICD-10-CM | POA: Insufficient documentation

## 2020-10-28 DIAGNOSIS — J309 Allergic rhinitis, unspecified: Secondary | ICD-10-CM | POA: Insufficient documentation

## 2020-10-28 DIAGNOSIS — F329 Major depressive disorder, single episode, unspecified: Secondary | ICD-10-CM | POA: Insufficient documentation

## 2020-10-28 DIAGNOSIS — E2839 Other primary ovarian failure: Secondary | ICD-10-CM | POA: Insufficient documentation

## 2020-10-28 DIAGNOSIS — G4730 Sleep apnea, unspecified: Secondary | ICD-10-CM | POA: Insufficient documentation

## 2020-10-28 DIAGNOSIS — G629 Polyneuropathy, unspecified: Secondary | ICD-10-CM | POA: Insufficient documentation

## 2020-10-28 DIAGNOSIS — Z800 Family history of malignant neoplasm of digestive organs: Secondary | ICD-10-CM | POA: Insufficient documentation

## 2020-10-29 ENCOUNTER — Other Ambulatory Visit: Payer: Self-pay

## 2020-10-29 ENCOUNTER — Ambulatory Visit: Payer: Medicare Other | Admitting: Podiatry

## 2020-10-29 ENCOUNTER — Encounter: Payer: Self-pay | Admitting: Podiatry

## 2020-10-29 DIAGNOSIS — D689 Coagulation defect, unspecified: Secondary | ICD-10-CM

## 2020-10-29 DIAGNOSIS — M79676 Pain in unspecified toe(s): Secondary | ICD-10-CM | POA: Diagnosis not present

## 2020-10-29 DIAGNOSIS — B351 Tinea unguium: Secondary | ICD-10-CM

## 2020-10-29 DIAGNOSIS — N182 Chronic kidney disease, stage 2 (mild): Secondary | ICD-10-CM

## 2020-10-29 DIAGNOSIS — G63 Polyneuropathy in diseases classified elsewhere: Secondary | ICD-10-CM

## 2020-10-29 NOTE — Progress Notes (Signed)
This patient returns to my office for at risk foot care.  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  This patient is unable to cut nails herself since the patient cannot reach her nails.These nails are painful walking and wearing shoes.  This patient presents for at risk foot care today.  General Appearance  Alert, conversant and in no acute stress.  Vascular  Dorsalis pedis and posterior tibial  pulses are palpable  bilaterally.  Capillary return is within normal limits  bilaterally. Temperature is within normal limits  bilaterally.  Neurologic  Senn-Weinstein monofilament wire test within normal limits  bilaterally. Muscle power within normal limits bilaterally.  Nails Thick disfigured discolored nails with subungual debris  from second  to fifth toes bilaterally. No evidence of bacterial infection or drainage bilaterally.  Orthopedic  No limitations of motion  feet .  No crepitus or effusions noted.  No bony pathology or digital deformities noted.  Skin  normotropic skin with no porokeratosis noted bilaterally.  No signs of infections or ulcers noted.     Onychomycosis  Pain in right toes  Pain in left toes  Consent was obtained for treatment procedures.   Mechanical debridement of nails 2-5  bilaterally performed with a nail nipper.  Filed with dremel without incident.    Return office visit   9 weeks                   Told patient to return for periodic foot care and evaluation due to potential at risk complications.   Lovina Zuver DPM  

## 2021-01-07 ENCOUNTER — Other Ambulatory Visit: Payer: Self-pay

## 2021-01-07 ENCOUNTER — Encounter: Payer: Self-pay | Admitting: Podiatry

## 2021-01-07 ENCOUNTER — Ambulatory Visit: Payer: Medicare Other | Admitting: Podiatry

## 2021-01-07 DIAGNOSIS — B351 Tinea unguium: Secondary | ICD-10-CM

## 2021-01-07 DIAGNOSIS — G63 Polyneuropathy in diseases classified elsewhere: Secondary | ICD-10-CM

## 2021-01-07 DIAGNOSIS — N182 Chronic kidney disease, stage 2 (mild): Secondary | ICD-10-CM

## 2021-01-07 DIAGNOSIS — D689 Coagulation defect, unspecified: Secondary | ICD-10-CM | POA: Diagnosis not present

## 2021-01-07 DIAGNOSIS — M79676 Pain in unspecified toe(s): Secondary | ICD-10-CM

## 2021-01-07 NOTE — Progress Notes (Signed)
This patient returns to my office for at risk foot care.  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  This patient is unable to cut nails herself since the patient cannot reach her nails.These nails are painful walking and wearing shoes.  This patient presents for at risk foot care today.  General Appearance  Alert, conversant and in no acute stress.  Vascular  Dorsalis pedis and posterior tibial  pulses are palpable  bilaterally.  Capillary return is within normal limits  bilaterally. Temperature is within normal limits  bilaterally.  Neurologic  Senn-Weinstein monofilament wire test within normal limits  bilaterally. Muscle power within normal limits bilaterally.  Nails Thick disfigured discolored nails with subungual debris  from second  to fifth toes bilaterally. No evidence of bacterial infection or drainage bilaterally.  Orthopedic  No limitations of motion  feet .  No crepitus or effusions noted.  No bony pathology or digital deformities noted.  Skin  normotropic skin with no porokeratosis noted bilaterally.  No signs of infections or ulcers noted.     Onychomycosis  Pain in right toes  Pain in left toes  Consent was obtained for treatment procedures.   Mechanical debridement of nails 2-5  bilaterally performed with a nail nipper.  Filed with dremel without incident.    Return office visit   9 weeks                   Told patient to return for periodic foot care and evaluation due to potential at risk complications.   Younes Degeorge DPM  

## 2021-01-15 ENCOUNTER — Other Ambulatory Visit: Payer: Self-pay | Admitting: Internal Medicine

## 2021-01-15 DIAGNOSIS — M7989 Other specified soft tissue disorders: Secondary | ICD-10-CM

## 2021-01-25 ENCOUNTER — Other Ambulatory Visit: Payer: Self-pay | Admitting: Obstetrics and Gynecology

## 2021-01-25 DIAGNOSIS — Z1231 Encounter for screening mammogram for malignant neoplasm of breast: Secondary | ICD-10-CM

## 2021-02-01 ENCOUNTER — Other Ambulatory Visit: Payer: Self-pay

## 2021-02-01 ENCOUNTER — Ambulatory Visit
Admission: RE | Admit: 2021-02-01 | Discharge: 2021-02-01 | Disposition: A | Discharge status: Home / Self Care | Payer: Medicare Other | Source: Ambulatory Visit | Attending: Internal Medicine | Admitting: Internal Medicine

## 2021-02-01 DIAGNOSIS — M7989 Other specified soft tissue disorders: Secondary | ICD-10-CM | POA: Insufficient documentation

## 2021-03-25 ENCOUNTER — Other Ambulatory Visit: Payer: Self-pay

## 2021-03-25 ENCOUNTER — Encounter: Payer: Self-pay | Admitting: Podiatry

## 2021-03-25 ENCOUNTER — Ambulatory Visit: Payer: Medicare Other | Admitting: Podiatry

## 2021-03-25 DIAGNOSIS — M79676 Pain in unspecified toe(s): Secondary | ICD-10-CM

## 2021-03-25 DIAGNOSIS — N182 Chronic kidney disease, stage 2 (mild): Secondary | ICD-10-CM | POA: Diagnosis not present

## 2021-03-25 DIAGNOSIS — D689 Coagulation defect, unspecified: Secondary | ICD-10-CM | POA: Diagnosis not present

## 2021-03-25 DIAGNOSIS — B351 Tinea unguium: Secondary | ICD-10-CM | POA: Diagnosis not present

## 2021-03-25 DIAGNOSIS — G63 Polyneuropathy in diseases classified elsewhere: Secondary | ICD-10-CM | POA: Diagnosis not present

## 2021-03-25 NOTE — Progress Notes (Signed)
This patient returns to my office for at risk foot care.  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  This patient is unable to cut nails herself since the patient cannot reach her nails.These nails are painful walking and wearing shoes.  This patient presents for at risk foot care today.  General Appearance  Alert, conversant and in no acute stress.  Vascular  Dorsalis pedis and posterior tibial  pulses are palpable  bilaterally.  Capillary return is within normal limits  bilaterally. Temperature is within normal limits  bilaterally.  Neurologic  Senn-Weinstein monofilament wire test within normal limits  bilaterally. Muscle power within normal limits bilaterally.  Nails Thick disfigured discolored nails with subungual debris  from second  to fifth toes bilaterally. No evidence of bacterial infection or drainage bilaterally.  Orthopedic  No limitations of motion  feet .  No crepitus or effusions noted.  No bony pathology or digital deformities noted.  Skin  normotropic skin with no porokeratosis noted bilaterally.  No signs of infections or ulcers noted.     Onychomycosis  Pain in right toes  Pain in left toes  Consent was obtained for treatment procedures.   Mechanical debridement of nails 2-5  bilaterally performed with a nail nipper.  Filed with dremel without incident.    Return office visit   9 weeks                   Told patient to return for periodic foot care and evaluation due to potential at risk complications.   Rosann Gorum DPM  

## 2021-03-30 ENCOUNTER — Encounter: Payer: Self-pay | Admitting: Dermatology

## 2021-03-30 ENCOUNTER — Encounter: Payer: Self-pay | Admitting: Adult Health

## 2021-03-31 ENCOUNTER — Telehealth: Payer: Self-pay | Admitting: Cardiovascular Disease

## 2021-03-31 NOTE — Telephone Encounter (Signed)
   [facility] Pre-operative Risk Assessment    Patient Name: Samantha Woodard  DOB: [birth year] MRN: [id number]  HEARTCARE STAFF:  - IMPORTANT!!!!!! Under Visit Info/Reason for Call, type in Other and utilize the format Clearance MM/DD/YY or Clearance TBD. Do not use dashes or single digits. - Please review there is not already an duplicate clearance open for this procedure. - If request is for dental extraction, please clarify the # of teeth to be extracted. - If the patient is currently at the dentist's office, call Pre-Op Callback Staff (MA/nurse) to input urgent request.  - If the patient is not currently in the dentist office, please route to the Pre-Op pool.  Request for surgical clearance:  What type of surgery is being performed? Right knee Arthroplasty  When is this surgery scheduled? TBD  What type of clearance is required (medical clearance vs. Pharmacy clearance to hold med vs. Both)? Both  Are there any medications that need to be held prior to surgery and how long? Not listed  Practice name and name of physician performing surgery? [facility]  What is the office phone number? [phone]   7.   What is the office fax number? [phone]  8.   Anesthesia type (None, local, MAC, general) ? spinal   Pilar A Ham 03/31/2021, 2:43 PM  _________________________________________________________________   (provider comments below)

## 2021-03-31 NOTE — Telephone Encounter (Signed)
I will send a message to the [place] office to ask for help in making appt for pre op clearance.

## 2021-03-31 NOTE — Telephone Encounter (Signed)
   Name: Samantha Woodard  DOB: April 26, 1941  MRN: [id number]  Primary Cardiologist: Ida Rogue, MD  Chart reviewed as part of pre-operative protocol coverage. Because of [name]'s past medical history and time since last visit, she will require a follow-up visit in order to better assess preoperative cardiovascular risk.  Last OV 07/2018 so > 1 year ago. (Did not have return f/u as this was PRN at that time.)  Pre-op covering staff: - Please schedule appointment and call patient to inform them. - Please contact requesting surgeon's office via preferred method (i.e, phone, fax) to inform them of need for appointment prior to surgery.  Regarding antiplatelet agents, she is not on these for cardiac reasons (Hx TIA) so would likely defer to primary care when patient seen for formal clearance.  Charlie Pitter, PA-C  03/31/2021, 3:40 PM

## 2021-04-01 ENCOUNTER — Telehealth: Payer: Self-pay | Admitting: *Deleted

## 2021-04-01 NOTE — Telephone Encounter (Signed)
I s/w the pt and her husband as to needing an appt for pre op clearance. Pt is agreeable to appt for tomorrow 04/02/21 @ 4:20. Pt and her husband thanked me for the call and the help. I will forward clearance notes to MD for appt tomorrow. Will send FYI to surgeon's office pt has appt tomorrow. Dr. Rockey Situ will fax over his office note clearing or not clearing the pt.

## 2021-04-01 NOTE — Telephone Encounter (Signed)
Received pre-op risk assessment sheet for completion.  In box to MM/NP.

## 2021-04-02 ENCOUNTER — Ambulatory Visit: Payer: Medicare Other | Admitting: Cardiovascular Disease

## 2021-04-05 ENCOUNTER — Ambulatory Visit: Payer: Medicare Other | Admitting: Obstetrics and Gynecology

## 2021-04-05 DIAGNOSIS — M797 Fibromyalgia: Secondary | ICD-10-CM | POA: Insufficient documentation

## 2021-04-05 DIAGNOSIS — M4150 Other secondary scoliosis, site unspecified: Secondary | ICD-10-CM | POA: Insufficient documentation

## 2021-04-06 ENCOUNTER — Encounter: Payer: Self-pay | Admitting: Cardiovascular Disease

## 2021-04-06 ENCOUNTER — Other Ambulatory Visit: Payer: Self-pay

## 2021-04-06 ENCOUNTER — Ambulatory Visit: Payer: Medicare Other | Admitting: Cardiovascular Disease

## 2021-04-06 VITALS — BP 110/62 | HR 71 | Ht 68.0 in | Wt 225.5 lb

## 2021-04-06 DIAGNOSIS — I479 Paroxysmal tachycardia, unspecified: Secondary | ICD-10-CM | POA: Diagnosis not present

## 2021-04-06 DIAGNOSIS — G459 Transient cerebral ischemic attack, unspecified: Secondary | ICD-10-CM

## 2021-04-06 DIAGNOSIS — E782 Mixed hyperlipidemia: Secondary | ICD-10-CM | POA: Diagnosis not present

## 2021-04-06 NOTE — Patient Instructions (Addendum)
Medication Instructions:  No changes  If you need a refill on your cardiac medications before your next appointment, please call your pharmacy.   Lab work: No new labs needed  Testing/Procedures: No new testing needed  Follow-Up: At [facility], you and your health needs are our priority.  As part of our continuing mission to provide you with exceptional heart care, we have created designated Provider Care Teams.  These Care Teams include your primary Cardiologist (physician) and Advanced Practice Providers (APPs -  Physician Assistants and Nurse Practitioners) who all work together to provide you with the care you need, when you need it.  You will need a follow up appointment as needed  Providers on your designated Care Team:   Christopher Berge, NP Ryan Dunn, PA-C Cadence Furth, PA-C  COVID-19 Vaccine Information can be found at: [email or web address] For questions related to vaccine distribution or appointments, please email [email or web address] or call [phone].    

## 2021-04-06 NOTE — Progress Notes (Signed)
Who presents for follow-up of her cardiology Office Note  Date:  04/06/2021   ID:  Samantha Woodard, DOB: 07-Apr-1941, MRN: [id number]  PCP:  Gladstone Lighter, MD   Chief Complaint  Patient presents with   Pre op clearance     Cardiac clearance for right knee replacement with Dr. Frederik Pear in [place]. Medications reviewed by the patient verbally. "Doing well."     HPI:  Samantha Woodard is a 81 y.o. female with a PMHx of: Paroxysmal tachycardia Anemia CKD HTN Dyslipidemia Varicose veins of lower extremities  TIA, on aspirin Plavix GERD Spina bifida occulta, Scoliosis  Mild mitral valve prolapse OSA Left knee replacement Obesity Degenerative Arthiritis Family history of heart disease.  Non smoker Who presents for follow-up of her chest pain, tachycardia, cardiac risk factors  LOV 07/2018 In follow-up today reports she is doing relatively well No chest pain or angina symptoms, no shortness of breath Reports having some GERD symptoms worse when lying supine  Scheduled for knee surgery, right knee  Also reports having back pain,"pinched nerve, scoliosis"  Labs reviewed Total chol <150 LDL 68  EKG personally reviewed by myself on todays visit Normal sinus rhythm rate 71 bpm no significant ST-T wave changes  Prior history reviewed Atypical chest discomfort on last visit 2 years ago, underwent stress test 07/2018 Pharmacological myocardial perfusion imaging study with no significant  ischemia Normal wall motion, EF estimated at 73% No EKG changes concerning for ischemia at peak stress or in recovery. Low risk scan   PMH:   has a past medical history of Anemia, Anxiety and depression, Arthritis, degenerative, Atrial fibrillation ([place]), Cerebrovascular disease, Chronic kidney disease, Chronic low back pain, Complication of anesthesia (1970's), Diverticulosis, Dyslipidemia, Fibromyalgia, GERD (gastroesophageal reflux disease), HTN (hypertension), IBS (irritable  bowel syndrome), Mild mitral valve prolapse, Obesity, OSA (obstructive sleep apnea), Peripheral neuropathy, Peripheral vascular disease ([place]), Positional vertigo, Rosacea, Rosacea, Scoliosis, Spina bifida occulta, Stroke ([place]), and Varicose vein.  PSH:    Past Surgical History:  Procedure Laterality Date   ABDOMINAL HYSTERECTOMY     CARDIAC CATHETERIZATION  1990's   no blockage    CHOLECYSTECTOMY     ENDOVENOUS ABLATION SAPHENOUS VEIN W/ LASER  10/22/2007   gallbladder resection     MINOR HEMORRHOIDECTOMY     TONSILLECTOMY     TOTAL KNEE ARTHROPLASTY Left 11/28/2016   Procedure: TOTAL KNEE ARTHROPLASTY;  Surgeon: Frederik Pear, MD;  Location: [facility];  Service: Orthopedics;  Laterality: Left;    Current Outpatient Medications  Medication Sig Dispense Refill   acetaminophen (TYLENOL) 500 MG tablet Take 500 mg by mouth every 6 (six) hours as needed.      aspirin EC 81 MG tablet Take 81 mg by mouth daily with supper.     carisoprodol (SOMA) 350 MG tablet Take 350 mg by mouth. Take 1/2 - 1 as needed     celecoxib (CELEBREX) 200 MG capsule Take 200 mg by mouth daily with lunch.      cetirizine (ZYRTEC) 10 MG tablet Take 10 mg by mouth at bedtime.     clopidogrel (PLAVIX) 75 MG tablet Take 0.5 tablets (37.5 mg total) by mouth daily. 135 tablet 1   fluticasone (FLONASE) 50 MCG/ACT nasal spray Place 2 sprays into both nostrils at bedtime.      gabapentin (NEURONTIN) 600 MG tablet Take 1 tablet (600 mg total) by mouth 3 (three) times daily. 270 tablet 3   hydrochlorothiazide (HYDRODIURIL) 12.5 MG tablet Take 12.5 mg by mouth daily  with lunch.      lisinopril (PRINIVIL,ZESTRIL) 5 MG tablet Take 2.5 mg by mouth daily with lunch.      Multiple Vitamin (MULTIVITAMIN WITH MINERALS) TABS tablet Take 1 tablet by mouth daily with supper. WOMEN'S 50+     pantoprazole (PROTONIX) 40 MG tablet Take 40 mg by mouth daily before breakfast. 30 MINS TO 1 HOUR BEFORE EATING.     Probiotic Product (ALIGN) 4 MG CAPS  Take 4 mg by mouth daily with lunch.     Simethicone 180 MG CAPS Take 180 mg by mouth 2 (two) times daily as needed (FOR BLOATING/GAS).     simvastatin (ZOCOR) 20 MG tablet Take 20 mg by mouth every evening.      tetrahydrozoline-zinc (VISINE-AC) 0.05-0.25 % ophthalmic solution Place 2 drops into both eyes 3 (three) times daily as needed (for redness/itchy eyes.).     Wheat Dextrin (BENEFIBER) POWD Take 14.2 g by mouth 2 (two) times daily.      azithromycin (ZITHROMAX) 250 MG tablet Take by mouth. (Patient not taking: Reported on 04/06/2021)     ipratropium (ATROVENT) 0.06 % nasal spray Place 2 sprays into both nostrils 4 (four) times daily. (Patient not taking: Reported on 04/06/2021)     promethazine-dextromethorphan (PROMETHAZINE-DM) 6.25-15 MG/5ML syrup 5 ml as needed (Patient not taking: Reported on 04/06/2021)     No current facility-administered medications for this visit.     ALLERGIES:   Neomycin, Tape, Iodine, and Other   SOCIAL HISTORY:  The patient  reports that she has never smoked. She has never used smokeless tobacco. She reports that she does not drink alcohol and does not use drugs.   FAMILY HISTORY:   family history includes Cancer in her father and mother; Carpal tunnel syndrome in her sister; Cerebrovascular Disease in her maternal grandmother; Diabetes in her sister; Heart attack in her father and mother; Heart disease in her father; Hyperlipidemia in her brother, sister, and sister; Hypertension in her brother, father, mother, sister, and sister; Stroke in her paternal grandmother; Varicose Veins in her mother, sister, and sister.    REVIEW OF SYSTEMS: Review of Systems  Constitutional: Negative.   HENT: Negative.    Eyes: Negative.   Respiratory: Negative.    Cardiovascular: Negative.   Gastrointestinal: Negative.   Genitourinary: Negative.   Musculoskeletal: Negative.   Neurological: Negative.   Psychiatric/Behavioral: Negative.    All other systems reviewed and  are negative.   PHYSICAL EXAM: VS:  BP 110/62 (BP Location: Left Arm, Patient Position: Sitting, Cuff Size: Normal)   Pulse 71   Ht 5\' 8"  (1.727 m)   Wt 225 lb 8 oz (102.3 kg)   SpO2 98%   BMI 34.29 kg/m  , BMI Body mass index is 34.29 kg/m. Constitutional:  oriented to person, place, and time. No distress.  HENT:  Head: Grossly normal Eyes:  no discharge. No scleral icterus.  Neck: No JVD, no carotid bruits  Cardiovascular: Regular rate and rhythm, no murmurs appreciated Pulmonary/Chest: Clear to auscultation bilaterally, no wheezes or rails Abdominal: Soft.  no distension.  no tenderness.  Musculoskeletal: Normal range of motion Neurological:  normal muscle tone. Coordination normal. No atrophy Skin: Skin warm and dry Psychiatric: normal affect, pleasant  RECENT LABS: No results found for requested labs within last 8760 hours.    LIPID PANEL: No results found for: CHOL, HDL, LDLCALC, TRIG    WEIGHT: Wt Readings from Last 3 Encounters:  04/06/21 225 lb 8 oz (102.3 kg)  08/20/20  231 lb (104.8 kg)  03/31/20 223 lb (101.2 kg)     ASSESSMENT AND PLAN: Preop cardiovascular evaluation Acceptable risk for knee replacement on the right No further cardiac testing needed Normal EKG Not on Plavix for cardiac reasons, this can likely be held 5 days prior to surgery  Chest pain with moderate risk for cardiac etiology  Atypical chest pain 2 years ago, stress test with no ischemia Kae Heller with no symptoms, no further testing needed  Morbid obesity Exercise limited secondary to arthritides knee and back pain Recommended low carbohydrate diet  Hyperlipidemia Cholesterol at goal on a statin,   TIAs on aspirin 1/2 dose Plavix By her report TIA 1999 was put on aspirin Plavix at that time Reports having minimal carotid disease on prior study  Stage II chronic kidney disease Recommend she stay hydrated  GERD Reports having GERD and hiatal hernia symptoms Periodic  symptoms when supine On PPI  Chronic back pain Limited exercise capacity   Total encounter time more than 25 minutes  Greater than 50% was spent in counseling and coordination of care with the patient    Orders Placed This Encounter  Procedures   EKG 12-Lead      Signed, Esmond Plants, M.D., Ph.D. 04/06/2021  [place], [place] [phone]

## 2021-04-07 ENCOUNTER — Ambulatory Visit
Admission: RE | Admit: 2021-04-07 | Discharge: 2021-04-07 | Disposition: A | Discharge status: Home / Self Care | Payer: Medicare Other | Source: Ambulatory Visit | Attending: Obstetrics and Gynecology | Admitting: Obstetrics and Gynecology

## 2021-04-07 DIAGNOSIS — Z1231 Encounter for screening mammogram for malignant neoplasm of breast: Secondary | ICD-10-CM | POA: Insufficient documentation

## 2021-04-07 NOTE — Telephone Encounter (Signed)
Fax confirmation received [facility].

## 2021-04-09 ENCOUNTER — Other Ambulatory Visit: Payer: Self-pay

## 2021-04-09 ENCOUNTER — Encounter: Payer: Self-pay | Admitting: Obstetrics and Gynecology

## 2021-04-09 ENCOUNTER — Ambulatory Visit (INDEPENDENT_AMBULATORY_CARE_PROVIDER_SITE_OTHER): Payer: Medicare Other | Admitting: Obstetrics and Gynecology

## 2021-04-09 ENCOUNTER — Other Ambulatory Visit (HOSPITAL_COMMUNITY)
Admission: RE | Admit: 2021-04-09 | Discharge: 2021-04-09 | Disposition: A | Discharge status: Home / Self Care | Payer: Medicare Other | Source: Ambulatory Visit | Attending: Obstetrics and Gynecology | Admitting: Obstetrics and Gynecology

## 2021-04-09 VITALS — BP 118/68 | HR 73 | Ht 68.0 in | Wt 226.0 lb

## 2021-04-09 DIAGNOSIS — Z124 Encounter for screening for malignant neoplasm of cervix: Secondary | ICD-10-CM | POA: Insufficient documentation

## 2021-04-09 DIAGNOSIS — Z1239 Encounter for other screening for malignant neoplasm of breast: Secondary | ICD-10-CM

## 2021-04-09 DIAGNOSIS — Z01419 Encounter for gynecological examination (general) (routine) without abnormal findings: Secondary | ICD-10-CM | POA: Diagnosis not present

## 2021-04-09 NOTE — Progress Notes (Signed)
Gynecology Annual Exam  PCP: Gladstone Lighter, MD  Chief Complaint:  Chief Complaint  Patient presents with   Gynecologic Exam    Annual - no concerns. RM 5    History of Present Illness:Patient is a 80 y.o. [id number] presents for annual exam. The patient has no complaints today.   LMP: No LMP recorded. Patient has had a hysterectomy.  The patient does perform self breast exams.  There is no notable family history of breast or ovarian cancer in her family.  The patient wears seatbelts: yes.   The patient has regular exercise: not asked.    The patient denies current symptoms of depression.     Review of Systems: Review of Systems  Constitutional:  Negative for chills and fever.  HENT:  Negative for congestion.   Respiratory:  Negative for cough and shortness of breath.   Cardiovascular:  Negative for chest pain and palpitations.  Gastrointestinal:  Negative for abdominal pain, constipation, diarrhea, heartburn, nausea and vomiting.  Genitourinary:  Negative for dysuria, frequency and urgency.  Skin:  Negative for itching and rash.  Neurological:  Negative for dizziness and headaches.  Endo/Heme/Allergies:  Negative for polydipsia.  Psychiatric/Behavioral:  Negative for depression.    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[place]) 10/28/2020   Sleep apnea 10/28/2020   Chest pain with moderate risk for cardiac etiology 07/24/2018   Morbid obesity ([place]) 07/24/2018    Mixed hyperlipidemia 07/24/2018   Stage 2 chronic kidney disease 07/24/2018   Gastroesophageal reflux disease without esophagitis 07/24/2018   Hiatal hernia 07/24/2018   Primary osteoarthritis of left knee 11/27/2016   Hx of colonic polyps 03/27/2014   Varicose veins of lower extremities with other complications 62/37/6283   Platelet inhibition due to Plavix 12/05/2013   Fecal incontinence 11/26/2013   Internal prolapsed hemorrhoids 11/26/2013   TIA (transient ischemic attack) 10/14/2013   Polyneuropathy in other diseases classified elsewhere ([place]) 10/14/2013    Past Surgical History:  Past Surgical History:  Procedure Laterality Date   ABDOMINAL HYSTERECTOMY     CARDIAC CATHETERIZATION  1990's   no blockage    CHOLECYSTECTOMY     ENDOVENOUS ABLATION SAPHENOUS VEIN W/ LASER  10/22/2007   gallbladder resection     MINOR HEMORRHOIDECTOMY     TONSILLECTOMY     TOTAL KNEE ARTHROPLASTY Left 11/28/2016   Procedure: TOTAL KNEE ARTHROPLASTY;  Surgeon: Frederik Pear, MD;  Location: [facility];  Service: Orthopedics;  Laterality: Left;    Gynecologic History:  No LMP recorded. Patient has had a hysterectomy. Last Pap: Results were: 10/19/2020no abnormalities  Last mammogram: 04/07/2021 Results were: Gillian Shields I  Obstetric History: [id number]  Family History:  Family History  Problem Relation Age of Onset   Cancer Mother    Hypertension Mother    Varicose Veins Mother    Heart attack Mother    Heart attack Father    Cancer Father    Heart disease Father    Hypertension Father    Diabetes Sister    Hypertension Sister  Hyperlipidemia Sister    Varicose Veins Sister    Hypertension Brother    Hyperlipidemia Brother    Cerebrovascular Disease Maternal Grandmother    Stroke Paternal Grandmother    Hypertension Sister    Hyperlipidemia Sister    Varicose Veins Sister    Carpal tunnel syndrome Sister    Breast cancer Neg Hx     Social History:  Social History   Socioeconomic  History   Marital status: Married    Spouse name: Not on file   Number of children: 2   Years of education: college 2   Highest education level: Not on file  Occupational History   Occupation: Retired  Tobacco Use   Smoking status: Never   Smokeless tobacco: Never  Vaping Use   Vaping Use: Never used  Substance and Sexual Activity   Alcohol use: No    Comment: on occasion   Drug use: No   Sexual activity: Not Currently  Other Topics Concern   Not on file  Social History Narrative   Patient is right handed.   Patient does not drink caffeine.   Social Determinants of Health   Financial Resource Strain: Not on file  Food Insecurity: Not on file  Transportation Needs: Not on file  Physical Activity: Not on file  Stress: Not on file  Social Connections: Not on file  Intimate Partner Violence: Not on file    Allergies:  Allergies  Allergen Reactions   Neomycin Hives, Rash and Itching   Tape Hives, Rash and Itching   Iodine Hives    IV CONTRAST   Other Hives and Rash    Medications: Prior to Admission medications   Medication Sig Start Date End Date Taking? Authorizing Provider  aspirin EC 81 MG tablet Take 81 mg by mouth daily with supper.   Yes [provider]  celecoxib (CELEBREX) 200 MG capsule Take 200 mg by mouth daily with lunch.  10/04/13  Yes [provider]  cetirizine (ZYRTEC) 10 MG tablet Take 10 mg by mouth at bedtime.   Yes [provider]  clopidogrel (PLAVIX) 75 MG tablet Take 0.5 tablets (37.5 mg total) by mouth daily. 04/14/20  Yes Ward Givens, NP  fluticasone (FLONASE) 50 MCG/ACT nasal spray Place 2 sprays into both nostrils at bedtime.  08/16/13  Yes [provider]  gabapentin (NEURONTIN) 600 MG tablet Take 1 tablet (600 mg total) by mouth 3 (three) times daily. 07/13/20  Yes Ward Givens, NP  hydrochlorothiazide (HYDRODIURIL) 12.5 MG tablet Take 12.5 mg by mouth daily with lunch.  07/18/13  Yes [provider]  lisinopril (PRINIVIL,ZESTRIL) 5 MG tablet Take 2.5 mg by mouth daily with lunch.  07/17/13  Yes [provider]  Multiple Vitamin (MULTIVITAMIN WITH MINERALS) TABS tablet Take 1 tablet by mouth daily with supper. WOMEN'S 50+   Yes [provider]  pantoprazole (PROTONIX) 40 MG tablet Take 40 mg by mouth daily before breakfast. 30 MINS TO 1 HOUR BEFORE EATING. 07/26/13  Yes [provider]  Probiotic Product (ALIGN) 4 MG CAPS Take 4 mg by mouth daily with lunch.   Yes [provider]  Simethicone 180 MG CAPS Take 180 mg by mouth 2 (two) times daily as needed (FOR BLOATING/GAS).   Yes [provider]  simvastatin (ZOCOR) 20 MG tablet Take 20 mg by mouth every evening.  09/03/13  Yes [provider]  Wheat Dextrin (BENEFIBER) POWD Take 14.2 g by mouth 2 (two) times daily.  Yes [provider]  acetaminophen (TYLENOL) 500 MG tablet Take 500 mg by mouth every 6 (six) hours as needed.     [provider]  carisoprodol (SOMA) 350 MG tablet Take 350 mg by mouth. Take 1/2 - 1 as needed    [provider]  tetrahydrozoline-zinc (VISINE-AC) 0.05-0.25 % ophthalmic solution Place 2 drops into both eyes 3 (three) times daily as needed (for redness/itchy eyes.).    [provider]    Physical Exam Vitals: Blood pressure 118/68, pulse 73, height 5\' 8"  (1.727 m), weight 226 lb (102.5 kg).  General: NAD HEENT: normocephalic, anicteric Thyroid: no enlargement, no palpable nodules Pulmonary: No increased work of breathing, CTAB Cardiovascular: RRR, distal pulses 2+ Breast: Breast symmetrical, no tenderness, no palpable nodules or masses, no skin or nipple retraction present, no nipple discharge.  No axillary or supraclavicular lymphadenopathy. Abdomen: NABS, soft, non-tender, non-distended.  Umbilicus without lesions.  No hepatomegaly, splenomegaly or masses palpable. No evidence of hernia   Genitourinary:  External: Normal external female genitalia.  Normal urethral meatus, normal Bartholin's and Skene's glands.    Vagina: Normal vaginal mucosa, no evidence of prolapse.    Cervix: surgically absent  Uterus: surgically absent  Adnexa: ovaries non-enlarged, no adnexal masses  Rectal: deferred  Lymphatic: no evidence of inguinal lymphadenopathy Extremities: no edema, erythema, or tenderness Neurologic: Grossly intact Psychiatric: mood appropriate, affect full  Female chaperone present for pelvic and breast  portions of the physical exam     Assessment: 80 y.o. [id number] routine annual exam  Plan: Problem List Items Addressed This Visit   None Visit Diagnoses     Encounter for gynecological examination without abnormal finding    -  Primary   Screening for malignant neoplasm of cervix       Relevant Orders   Cytology - PAP (Completed)   Breast screening           1) Mammogram - recommend yearly screening mammogram.  Mammogram Is up to date  2) STI screening  was notoffered and therefore not obtained  3) ASCCP guidelines and rational discussed.  Patient opts for  every 2 years  screening interval  4) Routine healthcare maintenance including cholesterol, diabetes screening discussed managed by PCP  5) Return in about 1 year (around 04/09/2022) for annual.    Malachy Mood, MD [facility] Pigeon, [facility] 04/09/2021, 2:44 PM

## 2021-04-11 NOTE — Telephone Encounter (Signed)
    Patient Name: Samantha Woodard  DOB: [birth year] MRN: [id number]  Primary Cardiologist: Ida Rogue, MD  Chart reviewed as part of pre-operative protocol coverage. Per Dr. Rockey Situ, [name] would be at acceptable risk for the planned procedure without further cardiovascular testing.   Per Dr. Rockey Situ, patient can hold plavix 5 days prior to her upcoming knee replacement with plans to restart as soon as she is cleared to do so by her surgeon.   I will route this recommendation to the requesting party via Epic fax function and remove from pre-op pool.  Please call with questions.  Abigail Butts, PA-C 04/11/2021, 10:51 PM

## 2021-04-13 LAB — CYTOLOGY - PAP: Diagnosis: NEGATIVE

## 2021-05-03 ENCOUNTER — Ambulatory Visit: Payer: Medicare Other | Admitting: Dermatology

## 2021-05-03 ENCOUNTER — Other Ambulatory Visit: Payer: Self-pay

## 2021-05-03 DIAGNOSIS — L231 Allergic contact dermatitis due to adhesives: Secondary | ICD-10-CM | POA: Diagnosis not present

## 2021-05-03 DIAGNOSIS — L309 Dermatitis, unspecified: Secondary | ICD-10-CM

## 2021-05-03 MED ORDER — MUPIROCIN 2 % EX OINT
TOPICAL_OINTMENT | CUTANEOUS | 0 refills | Status: DC
Start: 2021-05-03 — End: 2021-09-07

## 2021-05-03 MED ORDER — CLOBETASOL PROPIONATE 0.05 % EX CREA: TOPICAL_CREAM | CUTANEOUS | 0 refills | Status: DC

## 2021-05-03 NOTE — Patient Instructions (Addendum)
Topical steroids (such as triamcinolone, fluocinolone, fluocinonide, mometasone, clobetasol, halobetasol, betamethasone, hydrocortisone) can cause thinning and lightening of the skin if they are used for too long in the same area. Your physician has selected the right strength medicine for your problem and area affected on the body. Please use your medication only as directed by your physician to prevent side effects.     If You Need Anything After Your Visit  If you have any questions or concerns for your doctor, please call our main line at [phone] and press option 4 to reach your doctor's medical assistant. If no one answers, please leave a voicemail as directed and we will return your call as soon as possible. Messages left after 4 pm will be answered the following business day.   You may also send [place] a message via [place]. We typically respond to MyChart messages within 1-2 business days.  For prescription refills, please ask your pharmacy to contact our office. Our fax number is [phone].  If you have an urgent issue when the clinic is closed that cannot wait until the next business day, you can page your doctor at the number below.    Please note that while we do our best to be available for urgent issues outside of office hours, we are not available 24/7.   If you have an urgent issue and are unable to reach [place], you may choose to seek medical care at your doctor's office, retail clinic, urgent care center, or emergency room.  If you have a medical emergency, please immediately call 911 or go to the emergency department.  Pager Numbers  - Dr. Nehemiah Massed: [phone]  - Dr. Laurence Ferrari: [phone]  - Dr. Nicole Kindred: [phone]  In the event of inclement weather, please call our main line at [phone] for an update on the status of any delays or closures.  Dermatology Medication Tips: Please keep the boxes that topical medications come in in order to help keep track of the  instructions about where and how to use these. Pharmacies typically print the medication instructions only on the boxes and not directly on the medication tubes.   If your medication is too expensive, please contact our office at [phone] option 4 or send [place] a message through [place].   We are unable to tell what your co-pay for medications will be in advance as this is different depending on your insurance coverage. However, we may be able to find a substitute medication at lower cost or fill out paperwork to get insurance to cover a needed medication.   If a prior authorization is required to get your medication covered by your insurance company, please allow [place] 1-2 business days to complete this process.  Drug prices often vary depending on where the prescription is filled and some pharmacies may offer cheaper prices.  The website www.goodrx.com contains coupons for medications through different pharmacies. The prices here do not account for what the cost may be with help from insurance (it may be cheaper with your insurance), but the website can give you the price if you did not use any insurance.  - You can print the associated coupon and take it with your prescription to the pharmacy.  - You may also stop by our office during regular business hours and pick up a GoodRx coupon card.  - If you need your prescription sent electronically to a different pharmacy, notify our office through [facility] or by phone at [phone] option 4.  Si Usted Necesita Algo Despus de Su Visita  Tambin puede enviarnos un mensaje a travs de Pharmacist, community. Por lo general respondemos a los mensajes de MyChart en el transcurso de 1 a 2 das hbiles.  Para renovar recetas, por favor pida a su farmacia que se ponga en contacto con nuestra oficina. Harland Dingwall de fax es [place] [phone].  Si tiene un asunto urgente cuando la clnica est cerrada y que no puede esperar hasta el siguiente da hbil,  puede llamar/localizar a su doctor(a) al nmero que aparece a continuacin.   Por favor, tenga en cuenta que aunque hacemos todo lo posible para estar disponibles para asuntos urgentes fuera del horario de [place], no estamos disponibles las 24 horas del da, los 7 das de la [place].   Si tiene un problema urgente y no puede comunicarse con nosotros, puede optar por buscar atencin mdica  en el consultorio de su doctor(a), en una clnica privada, en un centro de atencin urgente o en una sala de emergencias.  Si tiene Engineering geologist, por favor llame inmediatamente al 911 o vaya a la sala de emergencias.  Nmeros de bper  - Dr. Nehemiah Massed: [phone]  - Dra. Moye: [phone]  - Dra. Nicole Kindred: [phone]  En caso de inclemencias del [place], por favor llame a Johnsie Kindred principal al [phone] para una actualizacin sobre el [place] de cualquier retraso o cierre.  Consejos para la medicacin en dermatologa: Por favor, guarde las cajas en las que vienen los medicamentos de uso tpico para ayudarle a seguir las instrucciones sobre dnde y cmo usarlos. Las farmacias generalmente imprimen las instrucciones del medicamento slo en las cajas y no directamente en los tubos del [place].   Si su medicamento es muy caro, por favor, pngase en contacto con Zigmund Daniel llamando al [phone] y presione la opcin 4 o envenos un mensaje a travs de Pharmacist, community.   No podemos decirle cul ser su copago por los medicamentos por adelantado ya que esto es diferente dependiendo de la cobertura de su seguro. Sin embargo, es posible que podamos encontrar un medicamento sustituto a Electrical engineer un formulario para que el seguro cubra el medicamento que se considera necesario.   Si se requiere una autorizacin previa para que su compaa de seguros [place] su medicamento, por favor permtanos de 1 a 2 das hbiles para completar este proceso.  Los precios de los medicamentos varan con  frecuencia dependiendo del Environmental consultant de dnde se surte la receta y alguna farmacias pueden ofrecer precios ms baratos.  El sitio web www.goodrx.com tiene cupones para medicamentos de Airline pilot. Los precios aqu no tienen en cuenta lo que podra costar con la ayuda del seguro (puede ser ms barato con su seguro), pero el sitio web puede darle el precio si no utiliz Research scientist (physical sciences).  - Puede imprimir el cupn correspondiente y llevarlo con su receta a la farmacia.  - Tambin puede pasar por nuestra oficina durante el horario de atencin regular y Charity fundraiser una tarjeta de cupones de GoodRx.  - Si necesita que su receta se enve electrnicamente a una farmacia diferente, informe a nuestra oficina a travs de MyChart de [place] o por telfono llamando al [phone] y presione la opcin 4.

## 2021-05-03 NOTE — Progress Notes (Signed)
   Follow-Up Visit   Subjective  Samantha Woodard is a 80 y.o. female who presents for the following: Rash (Possible bite on the L chest that occurred about 5 days ago - she experienced burning, itching, and a stinging sensation. She tried Polysporin and covering with bandage but that seemed to irritate it worse. She has also tried OTC cortisone. Patient has a rash that occurs off and on around the site as well that look like papules especially when she gets hot. ). She is allergic to Neosporin.   The following portions of the chart were reviewed this encounter and updated as appropriate:       Review of Systems:  No other skin or systemic complaints except as noted in HPI or Assessment and Plan.  Objective  Well appearing patient in no apparent distress; mood and affect are within normal limits.  A focused examination was performed including the chest. Relevant physical exam findings are noted in the Assessment and Plan.    Assessment & Plan  Dermatitis L chest  Contact dermatitis to polysporin and/or adhesive bandaid- with possible underlying bite reaction  Start Mupirocin 2% ointment to aa BID and Clobetasol 0.05% cream BID (apply first). Avoid the face, groin, and axilla.   Avoid polysporin in future  Topical steroids (such as triamcinolone, fluocinolone, fluocinonide, mometasone, clobetasol, halobetasol, betamethasone, hydrocortisone) can cause thinning and lightening of the skin if they are used for too long in the same area. Your physician has selected the right strength medicine for your problem and area affected on the body. Please use your medication only as directed by your physician to prevent side effects.    clobetasol cream (TEMOVATE) 0.05 % - L chest Apply to rash BID PRN. Do not covered with bandage.  mupirocin ointment (BACTROBAN) 2 % - L chest Apply to rash BID until healed.  Return for appointment as scheduled.  Luther Redo, CMA, am acting as scribe for  Brendolyn Patty, MD .  Documentation: I have reviewed the above documentation for accuracy and completeness, and I agree with the above.  Brendolyn Patty MD

## 2021-05-06 ENCOUNTER — Telehealth: Payer: Self-pay | Admitting: Adult Health

## 2021-05-06 MED ORDER — CLOPIDOGREL BISULFATE 75 MG PO TABS: 37.5000 mg | ORAL_TABLET | Freq: Every day | ORAL | 0 refills | Status: AC

## 2021-05-06 NOTE — Telephone Encounter (Signed)
Refill sent to [facility]. Pt has pending f/u March 2023.

## 2021-05-06 NOTE — Telephone Encounter (Signed)
Pt request refill for clopidogrel (PLAVIX) 75 MG tablet at [facility]

## 2021-07-01 ENCOUNTER — Ambulatory Visit: Payer: Medicare Other | Admitting: Podiatry

## 2021-07-15 ENCOUNTER — Ambulatory Visit: Payer: Medicare Other | Admitting: Podiatry

## 2021-07-15 ENCOUNTER — Other Ambulatory Visit: Payer: Self-pay

## 2021-07-15 ENCOUNTER — Encounter: Payer: Self-pay | Admitting: Podiatry

## 2021-07-15 DIAGNOSIS — G63 Polyneuropathy in diseases classified elsewhere: Secondary | ICD-10-CM | POA: Diagnosis not present

## 2021-07-15 DIAGNOSIS — B351 Tinea unguium: Secondary | ICD-10-CM

## 2021-07-15 DIAGNOSIS — N182 Chronic kidney disease, stage 2 (mild): Secondary | ICD-10-CM

## 2021-07-15 DIAGNOSIS — D689 Coagulation defect, unspecified: Secondary | ICD-10-CM | POA: Diagnosis not present

## 2021-07-15 DIAGNOSIS — M79676 Pain in unspecified toe(s): Secondary | ICD-10-CM

## 2021-07-15 NOTE — Progress Notes (Signed)
This patient returns to my office for at risk foot care.  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  This patient is unable to cut nails herself since the patient cannot reach her nails.These nails are painful walking and wearing shoes.  This patient presents for at risk foot care today.  General Appearance  Alert, conversant and in no acute stress.  Vascular  Dorsalis pedis and posterior tibial  pulses are palpable  bilaterally.  Capillary return is within normal limits  bilaterally. Temperature is within normal limits  bilaterally.  Neurologic  Senn-Weinstein monofilament wire test within normal limits  bilaterally. Muscle power within normal limits bilaterally.  Nails Thick disfigured discolored nails with subungual debris  from second  to fifth toes bilaterally. No evidence of bacterial infection or drainage bilaterally.  Orthopedic  No limitations of motion  feet .  No crepitus or effusions noted.  No bony pathology or digital deformities noted.  Skin  normotropic skin with no porokeratosis noted bilaterally.  No signs of infections or ulcers noted.     Onychomycosis  Pain in right toes  Pain in left toes  Consent was obtained for treatment procedures.   Mechanical debridement of nails 2-5  bilaterally performed with a nail nipper.  Filed with dremel without incident.    Return office visit   9 weeks                   Told patient to return for periodic foot care and evaluation due to potential at risk complications.   Mackinsey Pelland DPM  

## 2021-08-02 ENCOUNTER — Telehealth: Payer: Self-pay | Admitting: Adult Health

## 2021-08-02 ENCOUNTER — Encounter: Payer: Self-pay | Admitting: *Deleted

## 2021-08-02 MED ORDER — GABAPENTIN 600 MG PO TABS: 600.0000 mg | ORAL_TABLET | Freq: Three times a day (TID) | ORAL | 0 refills | Status: AC

## 2021-08-02 NOTE — Telephone Encounter (Signed)
Pt request refill for gabapentin (NEURONTIN) 600 MG tablet at [facility] 90-day supply

## 2021-08-09 ENCOUNTER — Other Ambulatory Visit: Payer: Self-pay | Admitting: Orthopedic Surgery

## 2021-08-09 DIAGNOSIS — M5136 Other intervertebral disc degeneration, lumbar region: Secondary | ICD-10-CM

## 2021-08-12 ENCOUNTER — Encounter: Payer: Self-pay | Admitting: Adult Health

## 2021-08-15 ENCOUNTER — Other Ambulatory Visit: Payer: Self-pay

## 2021-08-15 ENCOUNTER — Ambulatory Visit
Admission: RE | Admit: 2021-08-15 | Discharge: 2021-08-15 | Disposition: A | Discharge status: Home / Self Care | Payer: Medicare Other | Source: Ambulatory Visit | Attending: Orthopedic Surgery | Admitting: Orthopedic Surgery

## 2021-08-15 DIAGNOSIS — M5136 Other intervertebral disc degeneration, lumbar region: Secondary | ICD-10-CM | POA: Diagnosis not present

## 2021-08-21 ENCOUNTER — Encounter: Payer: Self-pay | Admitting: Podiatry

## 2021-08-23 ENCOUNTER — Ambulatory Visit: Payer: Medicare Other | Admitting: Adult Health

## 2021-08-23 ENCOUNTER — Telehealth: Payer: Self-pay | Admitting: *Deleted

## 2021-08-23 NOTE — Telephone Encounter (Signed)
I attempted to call Ms. Picklesimer.  Dr. Prudence Davidson does not see patients in the [facility] office on Monday afternoons.  I asked her to return my call, so we can reschedule her for another day or I advised her that we can schedule her an appointment [facility] location. ?

## 2021-09-01 ENCOUNTER — Other Ambulatory Visit: Payer: Self-pay | Admitting: Orthopedic Surgery

## 2021-09-08 ENCOUNTER — Other Ambulatory Visit: Payer: Medicare Other

## 2021-09-09 ENCOUNTER — Other Ambulatory Visit: Payer: Self-pay

## 2021-09-09 ENCOUNTER — Other Ambulatory Visit
Admission: RE | Admit: 2021-09-09 | Discharge: 2021-09-09 | Disposition: A | Discharge status: Home / Self Care | Payer: Medicare Other | Source: Ambulatory Visit | Attending: Orthopedic Surgery | Admitting: Orthopedic Surgery

## 2021-09-09 DIAGNOSIS — N182 Chronic kidney disease, stage 2 (mild): Secondary | ICD-10-CM

## 2021-09-09 DIAGNOSIS — Z01812 Encounter for preprocedural laboratory examination: Secondary | ICD-10-CM

## 2021-09-09 DIAGNOSIS — Z7902 Long term (current) use of antithrombotics/antiplatelets: Secondary | ICD-10-CM

## 2021-09-09 HISTORY — DX: Angina pectoris, unspecified: I20.9

## 2021-09-09 HISTORY — DX: Personal history of other diseases of the digestive system: Z87.19

## 2021-09-09 NOTE — Patient Instructions (Addendum)
Your procedure is scheduled on: 09/16/21 - Thursday ?Report to the Registration Desk on the 1st floor of the [facility]. ?To find out your arrival time, please call [phone] between 1PM - 3PM on: 09/15/21 - Wednesday ? ?REMEMBER: ?Instructions that are not followed completely may result in serious medical risk, up to and including death; or upon the discretion of your surgeon and anesthesiologist your surgery may need to be rescheduled. ? ?Do not eat food after midnight the night before surgery.  ?No gum chewing, lozengers or hard candies. ? ?You may however, drink CLEAR liquids up to 2 hours before you are scheduled to arrive for your surgery. Do not drink anything within 2 hours of your scheduled arrival time. ? ?Clear liquids include: ?- water  ?- apple juice without pulp ?- gatorade (not RED colors) ?- black coffee or tea (Do NOT add milk or creamers to the coffee or tea) ?Do NOT drink anything that is not on this list. ? ? ?TAKE THESE MEDICATIONS THE MORNING OF SURGERY WITH A SIP OF WATER: ? ?- gabapentin (NEURONTIN) 600 MG tablet ?- pantoprazole (PROTONIX) 40 MG tablet, (take one the night before and one on the morning of surgery - helps to prevent nausea after surgery.) ? ?Follow recommendations from Cardiologist, Pulmonologist or PCP regarding stopping Aspirin, Coumadin, Plavix, Eliquis, Pradaxa, or Pletal. Does not need to stop Plavix or Aspirin before surgery. ? ?One week prior to surgery: ?Stop Anti-inflammatories (NSAIDS) such as Advil, Aleve, Ibuprofen, Motrin, Naproxen, Naprosyn and Aspirin based products such as Excedrin, Goodys Powder, BC Powder. ? ?Stop ANY OVER THE COUNTER supplements until after surgery. ? ?You may however, continue to take Tylenol if needed for pain up until the day of surgery. ? ?No Alcohol for 24 hours before or after surgery. ? ?No Smoking including e-cigarettes for 24 hours prior to surgery.  ?No chewable tobacco products for at least 6 hours prior to surgery.  ?No  nicotine patches on the day of surgery. ? ?Do not use any "recreational" drugs for at least a week prior to your surgery.  ?Please be advised that the combination of cocaine and anesthesia may have negative outcomes, up to and including death. ?If you test positive for cocaine, your surgery will be cancelled. ? ?On the morning of surgery brush your teeth with toothpaste and water, you may rinse your mouth with mouthwash if you wish. ?Do not swallow any toothpaste or mouthwash. ? ?Do not wear jewelry, make-up, hairpins, clips or nail polish. ? ?Do not wear lotions, powders, or perfumes.  ? ?Do not shave body from the neck down 48 hours prior to surgery just in case you cut yourself which could leave a site for infection.  ?Also, freshly shaved skin may become irritated if using the CHG soap. ? ?Contact lenses, hearing aids and dentures may not be worn into surgery. ? ?Do not bring valuables to the hospital. [facility] is not responsible for any missing/lost belongings or valuables.  ? ?Notify your doctor if there is any change in your medical condition (cold, fever, infection). ? ?Wear comfortable clothing (specific to your surgery type) to the hospital. ? ?After surgery, you can help prevent lung complications by doing breathing exercises.  ?Take deep breaths and cough every 1-2 hours. Your doctor may order a device called an Incentive Spirometer to help you take deep breaths. ?When coughing or sneezing, hold a pillow firmly against your incision with both hands. This is called ?splinting.? Doing this helps protect your  incision. It also decreases belly discomfort. ? ?If you are being admitted to the hospital overnight, leave your suitcase in the car. ?After surgery it may be brought to your room. ? ?If you are being discharged the day of surgery, you will not be allowed to drive home. ?You will need a responsible adult (18 years or older) to drive you home and stay with you that night.  ? ?If you are taking public  transportation, you will need to have a responsible adult (18 years or older) with you. ?Please confirm with your physician that it is acceptable to use public transportation.  ? ?Please call the [place] Dept. at [phone] if you have any questions about these instructions. ? ?Surgery Visitation Policy: ? ?Patients undergoing a surgery or procedure may have two family members or support persons with them as long as the person is not COVID-19 positive or experiencing its symptoms.  ? ?Inpatient Visitation:   ? ?Visiting hours are 7 a.m. to 8 p.m. ?Up to four visitors are allowed at one time in a patient room, including children. The visitors may rotate out with other people during the day. One designated support person (adult) may remain overnight.  ?

## 2021-09-13 ENCOUNTER — Other Ambulatory Visit: Payer: Self-pay | Admitting: Internal Medicine

## 2021-09-13 DIAGNOSIS — R1084 Generalized abdominal pain: Secondary | ICD-10-CM

## 2021-09-16 ENCOUNTER — Ambulatory Visit: Admission: RE | Admit: 2021-09-16 | Payer: Medicare Other | Source: Home / Self Care | Admitting: Orthopedic Surgery

## 2021-09-16 ENCOUNTER — Encounter: Admission: RE | Payer: Self-pay | Source: Home / Self Care

## 2021-09-16 SURGERY — FUSION, PIP JOINT
Anesthesia: Choice | Laterality: Left

## 2021-09-21 ENCOUNTER — Ambulatory Visit
Admission: RE | Admit: 2021-09-21 | Discharge: 2021-09-21 | Disposition: A | Discharge status: Home / Self Care | Payer: Medicare Other | Source: Ambulatory Visit | Attending: Internal Medicine | Admitting: Internal Medicine

## 2021-09-21 DIAGNOSIS — R1084 Generalized abdominal pain: Secondary | ICD-10-CM | POA: Insufficient documentation

## 2021-09-30 ENCOUNTER — Ambulatory Visit: Payer: Medicare Other | Admitting: Podiatry

## 2021-10-14 ENCOUNTER — Encounter: Payer: Self-pay | Admitting: Podiatry

## 2021-10-14 ENCOUNTER — Ambulatory Visit: Payer: Medicare Other | Admitting: Podiatry

## 2021-10-14 DIAGNOSIS — G63 Polyneuropathy in diseases classified elsewhere: Secondary | ICD-10-CM

## 2021-10-14 DIAGNOSIS — B351 Tinea unguium: Secondary | ICD-10-CM

## 2021-10-14 DIAGNOSIS — M79676 Pain in unspecified toe(s): Secondary | ICD-10-CM | POA: Diagnosis not present

## 2021-10-14 DIAGNOSIS — D689 Coagulation defect, unspecified: Secondary | ICD-10-CM

## 2021-10-14 NOTE — Progress Notes (Signed)
This patient returns to my office for at risk foot care.  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  This patient is unable to cut nails herself since the patient cannot reach her nails.These nails are painful walking and wearing shoes.  This patient presents for at risk foot care today.  General Appearance  Alert, conversant and in no acute stress.  Vascular  Dorsalis pedis and posterior tibial  pulses are palpable  bilaterally.  Capillary return is within normal limits  bilaterally. Temperature is within normal limits  bilaterally.  Neurologic  Senn-Weinstein monofilament wire test within normal limits  bilaterally. Muscle power within normal limits bilaterally.  Nails Thick disfigured discolored nails with subungual debris  from second  to fifth toes bilaterally. No evidence of bacterial infection or drainage bilaterally.  Orthopedic  No limitations of motion  feet .  No crepitus or effusions noted.  No bony pathology or digital deformities noted.  Skin  normotropic skin with no porokeratosis noted bilaterally.  No signs of infections or ulcers noted.     Onychomycosis  Pain in right toes  Pain in left toes  Consent was obtained for treatment procedures.   Mechanical debridement of nails 2-5  bilaterally performed with a nail nipper.  Filed with dremel without incident.    Return office visit   9 weeks                   Told patient to return for periodic foot care and evaluation due to potential at risk complications.   Alejos Reinhardt DPM  

## 2021-10-26 ENCOUNTER — Ambulatory Visit: Payer: Medicare Other | Admitting: Dermatology

## 2021-10-26 DIAGNOSIS — D2239 Melanocytic nevi of other parts of face: Secondary | ICD-10-CM | POA: Diagnosis not present

## 2021-10-26 DIAGNOSIS — D2271 Melanocytic nevi of right lower limb, including hip: Secondary | ICD-10-CM

## 2021-10-26 DIAGNOSIS — D229 Melanocytic nevi, unspecified: Secondary | ICD-10-CM | POA: Diagnosis not present

## 2021-10-26 DIAGNOSIS — L578 Other skin changes due to chronic exposure to nonionizing radiation: Secondary | ICD-10-CM

## 2021-10-26 DIAGNOSIS — D225 Melanocytic nevi of trunk: Secondary | ICD-10-CM | POA: Diagnosis not present

## 2021-10-26 DIAGNOSIS — D235 Other benign neoplasm of skin of trunk: Secondary | ICD-10-CM | POA: Diagnosis not present

## 2021-10-26 DIAGNOSIS — D1800 Hemangioma unspecified site: Secondary | ICD-10-CM

## 2021-10-26 DIAGNOSIS — I8393 Asymptomatic varicose veins of bilateral lower extremities: Secondary | ICD-10-CM

## 2021-10-26 DIAGNOSIS — L814 Other melanin hyperpigmentation: Secondary | ICD-10-CM

## 2021-10-26 DIAGNOSIS — Z1283 Encounter for screening for malignant neoplasm of skin: Secondary | ICD-10-CM | POA: Diagnosis not present

## 2021-10-26 DIAGNOSIS — L905 Scar conditions and fibrosis of skin: Secondary | ICD-10-CM

## 2021-10-26 DIAGNOSIS — D2339 Other benign neoplasm of skin of other parts of face: Secondary | ICD-10-CM

## 2021-10-26 DIAGNOSIS — D239 Other benign neoplasm of skin, unspecified: Secondary | ICD-10-CM

## 2021-10-26 DIAGNOSIS — L821 Other seborrheic keratosis: Secondary | ICD-10-CM

## 2021-10-26 DIAGNOSIS — D485 Neoplasm of uncertain behavior of skin: Secondary | ICD-10-CM

## 2021-10-26 HISTORY — DX: Other benign neoplasm of skin, unspecified: D23.9

## 2021-10-26 HISTORY — DX: Melanocytic nevi, unspecified: D22.9

## 2021-10-26 NOTE — Patient Instructions (Addendum)
Recommend starting moisturizer with exfoliant (Urea, Salicylic acid, or Lactic acid) one to two times daily to help smooth rough and bumpy skin.  OTC options include Cetaphil Rough and Bumpy lotion (Urea), Eucerin Roughness Relief lotion or spot treatment cream (Urea), CeraVe SA lotion/cream for Rough and Bumpy skin (Sal Acid), Gold Bond Rough and Bumpy cream (Sal Acid), and AmLactin 12% lotion/cream (Lactic Acid).  If applying in morning, also apply sunscreen to sun-exposed areas, since these exfoliating moisturizers can increase sensitivity to sun.  Wound Care Instructions  Cleanse wound gently with soap and water once a day then pat dry with clean gauze. Apply a thing coat of Petrolatum (petroleum jelly, "Vaseline") over the wound (unless you have an allergy to this). We recommend that you use a new, sterile tube of Vaseline. Do not pick or remove scabs. Do not remove the yellow or white "healing tissue" from the base of the wound.  Cover the wound with fresh, clean, nonstick gauze and secure with paper tape. You may use Band-Aids in place of gauze and tape if the would is small enough, but would recommend trimming much of the tape off as there is often too much. Sometimes Band-Aids can irritate the skin.  You should call the office for your biopsy report after 1 week if you have not already been contacted.  If you experience any problems, such as abnormal amounts of bleeding, swelling, significant bruising, significant pain, or evidence of infection, please call the office immediately.  FOR ADULT SURGERY PATIENTS: If you need something for pain relief you may take 1 extra strength Tylenol (acetaminophen) AND 2 Ibuprofen ('200mg'$  each) together every 4 hours as needed for pain. (do not take these if you are allergic to them or if you have a reason you should not take them.) Typically, you may only need pain medication for 1 to 3 days.    Melanoma ABCDEs  Melanoma is the most dangerous type of skin  cancer, and is the leading cause of death from skin disease.  You are more likely to develop melanoma if you: Have light-colored skin, light-colored eyes, or red or blond hair Spend a lot of time in the sun Tan regularly, either outdoors or in a tanning bed Have had blistering sunburns, especially during childhood Have a close family member who has had a melanoma Have atypical moles or large birthmarks  Early detection of melanoma is key since treatment is typically straightforward and cure rates are extremely high if we catch it early.   The first sign of melanoma is often a change in a mole or a new dark spot.  The ABCDE system is a way of remembering the signs of melanoma.  A for asymmetry:  The two halves do not match. B for border:  The edges of the growth are irregular. C for color:  A mixture of colors are present instead of an even brown color. D for diameter:  Melanomas are usually (but not always) greater than 3m - the size of a pencil eraser. E for evolution:  The spot keeps changing in size, shape, and color.  Please check your skin once per month between visits. You can use a small mirror in front and a large mirror behind you to keep an eye on the back side or your body.   If you see any new or changing lesions before your next follow-up, please call to schedule a visit.  Please continue daily skin protection including broad spectrum sunscreen SPF 30+  to sun-exposed areas, reapplying every 2 hours as needed when you're outdoors.    If You Need Anything After Your Visit  If you have any questions or concerns for your doctor, please call our main line at [phone] and press option 4 to reach your doctor's medical assistant. If no one answers, please leave a voicemail as directed and we will return your call as soon as possible. Messages left after 4 pm will be answered the following business day.   You may also send [place] a message via [place]. We typically respond to MyChart  messages within 1-2 business days.  For prescription refills, please ask your pharmacy to contact our office. Our fax number is [phone].  If you have an urgent issue when the clinic is closed that cannot wait until the next business day, you can page your doctor at the number below.    Please note that while we do our best to be available for urgent issues outside of office hours, we are not available 24/7.   If you have an urgent issue and are unable to reach [place], you may choose to seek medical care at your doctor's office, retail clinic, urgent care center, or emergency room.  If you have a medical emergency, please immediately call 911 or go to the emergency department.  Pager Numbers  - Dr. Nehemiah Massed: [phone]  - Dr. Laurence Ferrari: [phone]  - Dr. Nicole Kindred: [phone]  In the event of inclement weather, please call our main line at [phone] for an update on the status of any delays or closures.  Dermatology Medication Tips: Please keep the boxes that topical medications come in in order to help keep track of the instructions about where and how to use these. Pharmacies typically print the medication instructions only on the boxes and not directly on the medication tubes.   If your medication is too expensive, please contact our office at [phone] option 4 or send [place] a message through [place].   We are unable to tell what your co-pay for medications will be in advance as this is different depending on your insurance coverage. However, we may be able to find a substitute medication at lower cost or fill out paperwork to get insurance to cover a needed medication.   If a prior authorization is required to get your medication covered by your insurance company, please allow [place] 1-2 business days to complete this process.  Drug prices often vary depending on where the prescription is filled and some pharmacies may offer cheaper prices.  The website www.goodrx.com contains  coupons for medications through different pharmacies. The prices here do not account for what the cost may be with help from insurance (it may be cheaper with your insurance), but the website can give you the price if you did not use any insurance.  - You can print the associated coupon and take it with your prescription to the pharmacy.  - You may also stop by our office during regular business hours and pick up a GoodRx coupon card.  - If you need your prescription sent electronically to a different pharmacy, notify our office through [facility] or by phone at [phone] option 4.     Si Usted Necesita Algo Despus de Su Visita  Tambin puede enviarnos un mensaje a travs de Pharmacist, community. Por lo general respondemos a los mensajes de MyChart en el transcurso de 1 a 2 das hbiles.  Para renovar recetas, por favor pida a su farmacia que se  ponga en contacto con nuestra oficina. Harland Dingwall de fax es [place] [phone].  Si tiene un asunto urgente cuando la clnica est cerrada y que no puede esperar hasta el siguiente da hbil, puede llamar/localizar a su doctor(a) al nmero que aparece a continuacin.   Por favor, tenga en cuenta que aunque hacemos todo lo posible para estar disponibles para asuntos urgentes fuera del horario de [place], no estamos disponibles las 24 horas del da, los 7 das de la [place].   Si tiene un problema urgente y no puede comunicarse con nosotros, puede optar por buscar atencin mdica  en el consultorio de su doctor(a), en una clnica privada, en un centro de atencin urgente o en una sala de emergencias.  Si tiene Engineering geologist, por favor llame inmediatamente al 911 o vaya a la sala de emergencias.  Nmeros de bper  - Dr. Nehemiah Massed: [phone]  - Dra. Moye: [phone]  - Dra. Nicole Kindred: [phone]  En caso de inclemencias del [place], por favor llame a Johnsie Kindred principal al [phone] para una actualizacin sobre el [place] de  cualquier retraso o cierre.  Consejos para la medicacin en dermatologa: Por favor, guarde las cajas en las que vienen los medicamentos de uso tpico para ayudarle a seguir las instrucciones sobre dnde y cmo usarlos. Las farmacias generalmente imprimen las instrucciones del medicamento slo en las cajas y no directamente en los tubos del [place].   Si su medicamento es muy caro, por favor, pngase en contacto con Zigmund Daniel llamando al [phone] y presione la opcin 4 o envenos un mensaje a travs de Pharmacist, community.   No podemos decirle cul ser su copago por los medicamentos por adelantado ya que esto es diferente dependiendo de la cobertura de su seguro. Sin embargo, es posible que podamos encontrar un medicamento sustituto a Electrical engineer un formulario para que el seguro cubra el medicamento que se considera necesario.   Si se requiere una autorizacin previa para que su compaa de seguros [place] su medicamento, por favor permtanos de 1 a 2 das hbiles para completar este proceso.  Los precios de los medicamentos varan con frecuencia dependiendo del Environmental consultant de dnde se surte la receta y alguna farmacias pueden ofrecer precios ms baratos.  El sitio web www.goodrx.com tiene cupones para medicamentos de Airline pilot. Los precios aqu no tienen en cuenta lo que podra costar con la ayuda del seguro (puede ser ms barato con su seguro), pero el sitio web puede darle el precio si no utiliz Research scientist (physical sciences).  - Puede imprimir el cupn correspondiente y llevarlo con su receta a la farmacia.  - Tambin puede pasar por nuestra oficina durante el horario de atencin regular y Charity fundraiser una tarjeta de cupones de GoodRx.  - Si necesita que su receta se enve electrnicamente a una farmacia diferente, informe a nuestra oficina a travs de MyChart de [place] o por telfono llamando al [phone] y presione la opcin 4.

## 2021-10-26 NOTE — Progress Notes (Signed)
Follow-Up Visit   Subjective  Samantha Woodard is a 81 y.o. female who presents for the following: Annual Exam (The patient presents for Total-Body Skin Exam (TBSE) for skin cancer screening and mole check.  The patient has spots, moles and lesions to be evaluated, some may be new or changing and the patient has concerns that these could be cancer. Patient with no hx of skin cancer. She does have a spot at right lower leg that she has checked and a spot at abdomen that she would like checked. ).   The following portions of the chart were reviewed this encounter and updated as appropriate:       Review of Systems:  No other skin or systemic complaints except as noted in HPI or Assessment and Plan.  Objective  Well appearing patient in no apparent distress; mood and affect are within normal limits.  A full examination was performed including scalp, head, eyes, ears, nose, lips, neck, chest, axillae, abdomen, back, buttocks, bilateral upper extremities, bilateral lower extremities, hands, feet, fingers, toes, fingernails, and toenails. All findings within normal limits unless otherwise noted below.  left malar cheek L malar cheek: 0.5 x 0.4 cm light tan papule   right anterior ankle Waxy, tan/white scaly patch 2.5 cm  back x 2 Dilated pores  right paranasal 2 mm blue macule  mid abdomen Hypertrophic scar  right medial posterior ankle 1 cm speckled brown patch SK vs Lentigo r/o Atypia       spinal mid upper back 0.4 x 0.3 cm medium brown macule Nevus r/o Dysplasia         Assessment & Plan  Nevus left malar cheek  Benign-appearing.  Observation.  Call clinic for new or changing lesions.  Recommend daily use of broad spectrum spf 30+ sunscreen to sun-exposed areas.    Seborrheic keratosis right anterior ankle  Recommend AmLactin, Eucerin Roughness Relief or CeraVe rough and Bumpy. Samples given to patient to try. Use twice daily   Not bothersome for  patient.  Dilated pore of [place] of back back x 2  Benign-appearing.  Observation.  Call clinic for new or changing lesions.  Recommend daily use of broad spectrum spf 30+ sunscreen to sun-exposed areas.    Blue nevus right paranasal  Benign-appearing.  Observation.  Call clinic for new or changing lesions.  Recommend daily use of broad spectrum spf 30+ sunscreen to sun-exposed areas.    Scar mid abdomen  Benign, observe.    Neoplasm of uncertain behavior of skin (2) right medial posterior ankle  Skin / nail biopsy Type of biopsy: tangential   Informed consent: discussed and consent obtained   Patient was prepped and draped in usual sterile fashion: Area prepped with alcohol. Anesthesia: the lesion was anesthetized in a standard fashion   Anesthetic:  1% lidocaine w/ epinephrine 1-100,000 buffered w/ 8.4% NaHCO3 Instrument used: flexible razor blade   Hemostasis achieved with: pressure, aluminum chloride and electrodesiccation   Outcome: patient tolerated procedure well   Post-procedure details: wound care instructions given   Post-procedure details comment:  Ointment and small bandage applied  Specimen 1 - Surgical pathology Differential Diagnosis: SK vs Lentigo r/o Atypia  Check Margins: No 0.1 cm speckled brown patch   spinal mid upper back  Epidermal / dermal shaving  Lesion diameter (cm):  0.4 Informed consent: discussed and consent obtained   Patient was prepped and draped in usual sterile fashion: Area prepped with alcohol. Anesthesia: the lesion was anesthetized in a standard  fashion   Anesthetic:  1% lidocaine w/ epinephrine 1-100,000 buffered w/ 8.4% NaHCO3 Instrument used: flexible razor blade   Hemostasis achieved with: pressure, aluminum chloride and electrodesiccation   Outcome: patient tolerated procedure well   Post-procedure details: wound care instructions given   Post-procedure details comment:  Ointment and small bandage applied.   Specimen  2 - Surgical pathology Differential Diagnosis: Nevus r/o Dysplasia  Check Margins: No 0.4 x 0.3 cm medium brown macule   Lentigines - Scattered tan macules - Due to sun exposure - Benign-appearing, observe - Recommend daily broad spectrum sunscreen SPF 30+ to sun-exposed areas, reapply every 2 hours as needed. - Call for any changes  Seborrheic Keratoses - Stuck-on, waxy, tan-brown papules and/or plaques  - Benign-appearing - Discussed benign etiology and prognosis. - Observe - Call for any changes  Melanocytic Nevi - Tan-brown and/or pink-flesh-colored symmetric macules and papules - Benign appearing on exam today - Observation - Call clinic for new or changing moles - Recommend daily use of broad spectrum spf 30+ sunscreen to sun-exposed areas.   Hemangiomas - Red papules - Discussed benign nature - Observe - Call for any changes  Actinic Damage - Chronic condition, secondary to cumulative UV/sun exposure - diffuse scaly erythematous macules with underlying dyspigmentation - Recommend daily broad spectrum sunscreen SPF 30+ to sun-exposed areas, reapply every 2 hours as needed.  - Staying in the shade or wearing long sleeves, sun glasses (UVA+UVB protection) and wide brim hats (4-inch brim around the entire circumference of the hat) are also recommended for sun protection.  - Call for new or changing lesions.  Varicose Veins/Spider Veins - Dilated blue, purple or red veins at the lower extremities - Reassured - Smaller vessels can be treated by sclerotherapy (a procedure to inject a medicine into the veins to make them disappear) if desired, but the treatment is not covered by insurance. Larger vessels may be covered if symptomatic and we would refer to vascular surgeon if treatment desired.  Skin cancer screening performed today.  Return in about 1 year (around 10/27/2022) for TBSE.  Graciella Belton, RMA, am acting as scribe for Brendolyn Patty, MD  .  Documentation: I have reviewed the above documentation for accuracy and completeness, and I agree with the above.  Brendolyn Patty MD

## 2021-11-02 ENCOUNTER — Telehealth: Payer: Self-pay

## 2021-11-02 NOTE — Telephone Encounter (Signed)
-----   Message from Brendolyn Patty, MD sent at 11/02/2021 12:56 PM EDT ----- 1. Skin , right medial posterior ankle ATYPICAL INTRAEPIDERMAL MELANOCYTIC PROLIFERATION, LATERAL AND DEEP MARGINS INVOLVED, SEE DESCRIPTION 2. 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 Atypical melanocytic proliferation- needs excision, will leave open for healing by 2ndary intention 2. Mod/severely atypical mole  - needs repeat shave removal  -please call patient

## 2021-11-02 NOTE — Telephone Encounter (Signed)
Left pt msg to call for bx results/sh 

## 2021-11-04 ENCOUNTER — Telehealth: Payer: Self-pay

## 2021-11-04 NOTE — Telephone Encounter (Signed)
Patient advised of BX results.  Scheduled surgery and advised patient we can schedule shave removal when Dr. Nicole Kindred would like patient back for post op appointment. aw

## 2021-11-04 NOTE — Telephone Encounter (Signed)
Patient returned Samantha Woodard's phone call during lunch.  Called patient but had to leave VM to return call. aw

## 2021-11-04 NOTE — Telephone Encounter (Signed)
-----   Message from Brendolyn Patty, MD sent at 11/02/2021 12:56 PM EDT ----- 1. Skin , right medial posterior ankle ATYPICAL INTRAEPIDERMAL MELANOCYTIC PROLIFERATION, LATERAL AND DEEP MARGINS INVOLVED, SEE DESCRIPTION 2. 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 Atypical melanocytic proliferation- needs excision, will leave open for healing by 2ndary intention 2. Mod/severely atypical mole  - needs repeat shave removal  -please call patient

## 2021-11-22 ENCOUNTER — Ambulatory Visit (INDEPENDENT_AMBULATORY_CARE_PROVIDER_SITE_OTHER): Payer: Medicare Other | Admitting: Dermatology

## 2021-11-22 ENCOUNTER — Encounter: Payer: Self-pay | Admitting: Dermatology

## 2021-11-22 DIAGNOSIS — D485 Neoplasm of uncertain behavior of skin: Secondary | ICD-10-CM

## 2021-11-22 MED ORDER — MUPIROCIN 2 % EX OINT: 1.0000 | TOPICAL_OINTMENT | Freq: Every day | CUTANEOUS | 0 refills | Status: DC

## 2021-11-22 NOTE — Patient Instructions (Addendum)
Recommend compression knee highs 15-65m/Hg. Apply first thing in the morning and take off every night before bed.   Wound Care Instructions  Cleanse wound gently with soap and water once a day then pat dry with clean gauze. Apply a thing coat of Petrolatum (petroleum jelly, "Vaseline") over the wound (unless you have an allergy to this). We recommend that you use a new, sterile tube of Vaseline. Do not pick or remove scabs. Do not remove the yellow or white "healing tissue" from the base of the wound.  Cover the wound with fresh, clean, nonstick gauze and secure with paper tape. You may use Band-Aids in place of gauze and tape if the would is small enough, but would recommend trimming much of the tape off as there is often too much. Sometimes Band-Aids can irritate the skin.  You should call the office for your biopsy report after 1 week if you have not already been contacted.  If you experience any problems, such as abnormal amounts of bleeding, swelling, significant bruising, significant pain, or evidence of infection, please call the office immediately.  FOR ADULT SURGERY PATIENTS: If you need something for pain relief you may take 1 extra strength Tylenol (acetaminophen) AND 2 Ibuprofen ('200mg'$  each) together every 4 hours as needed for pain. (do not take these if you are allergic to them or if you have a reason you should not take them.) Typically, you may only need pain medication for 1 to 3 days.     Due to recent changes in healthcare laws, you may see results of your pathology and/or laboratory studies on MyChart before the doctors have had a chance to review them. We understand that in some cases there may be results that are confusing or concerning to you. Please understand that not all results are received at the same time and often the doctors may need to interpret multiple results in order to provide you with the best plan of care or course of treatment. Therefore, we ask that you  please give [place] business days to thoroughly review all your results before contacting the office for clarification. Should we see a critical lab result, you will be contacted sooner.   If You Need Anything After Your Visit  If you have any questions or concerns for your doctor, please call our main line at [phone] press option 4 to reach your doctor's medical assistant. If no one answers, please leave a voicemail as directed and we will return your call as soon as possible. Messages left after 4 pm will be answered the following business day.   You may also send [place] message via [place] We typically respond to MyChart messages within 1-2 business days.  For prescription refills, please ask your pharmacy to contact our office. Our fax number is [phone]  If you have an urgent issue when the clinic is closed that cannot wait until the next business day, you can page your doctor at the number below.    Please note that while we do our best to be available for urgent issues outside of office hours, we are not available 24/7.   If you have an urgent issue and are unable to reach [place] you may choose to seek medical care at your doctor's office, retail clinic, urgent care center, or emergency room.  If you have a medical emergency, please immediately call 911 or go to the emergency department.  Pager Numbers  - Dr. KNehemiah Massed [phone] - Dr.  Dr. Moye: [phone]  - Dr. Stewart: [phone]  In the event of inclement weather, please call our main line at [phone] for an update on the status of any delays or closures.  Dermatology Medication Tips: Please keep the boxes that topical medications come in in order to help keep track of the instructions about where and how to use these. Pharmacies typically print the medication instructions only on the boxes and not directly on the medication tubes.   If your medication is too expensive, please contact our office at  [phone] option 4 or send us a message through MyChart.   We are unable to tell what your co-pay for medications will be in advance as this is different depending on your insurance coverage. However, we may be able to find a substitute medication at lower cost or fill out paperwork to get insurance to cover a needed medication.   If a prior authorization is required to get your medication covered by your insurance company, please allow us 1-2 business days to complete this process.  Drug prices often vary depending on where the prescription is filled and some pharmacies may offer cheaper prices.  The website www.goodrx.com contains coupons for medications through different pharmacies. The prices here do not account for what the cost may be with help from insurance (it may be cheaper with your insurance), but the website can give you the price if you did not use any insurance.  - You can print the associated coupon and take it with your prescription to the pharmacy.  - You may also stop by our office during regular business hours and pick up a GoodRx coupon card.  - If you need your prescription sent electronically to a different pharmacy, notify our office through [place] MyChart or by phone at [phone] option 4.     Si Usted Necesita Algo Despus de Su Visita  Tambin puede enviarnos un mensaje a travs de MyChart. Por lo general respondemos a los mensajes de MyChart en el transcurso de 1 a 2 das hbiles.  Para renovar recetas, por favor pida a su farmacia que se ponga en contacto con nuestra oficina. Nuestro nmero de fax es el [phone].  Si tiene un asunto urgente cuando la clnica est cerrada y que no puede esperar hasta el siguiente da hbil, puede llamar/localizar a su doctor(a) al nmero que aparece a continuacin.   Por favor, tenga en cuenta que aunque hacemos todo lo posible para estar disponibles para asuntos urgentes fuera del horario de oficina, no estamos  disponibles las 24 horas del da, los 7 das de la semana.   Si tiene un problema urgente y no puede comunicarse con nosotros, puede optar por buscar atencin mdica  en el consultorio de su doctor(a), en una clnica privada, en un centro de atencin urgente o en una sala de emergencias.  Si tiene una emergencia mdica, por favor llame inmediatamente al 911 o vaya a la sala de emergencias.  Nmeros de bper  - Dr. Kowalski: [phone]  - Dra. Moye: [phone]  - Dra. Stewart: [phone]  En caso de inclemencias del tiempo, por favor llame a nuestra lnea principal al [phone] para una actualizacin sobre el estado de cualquier retraso o cierre.  Consejos para la medicacin en dermatologa: Por favor, guarde las cajas en las que vienen los medicamentos de uso tpico para ayudarle a seguir las instrucciones sobre dnde y cmo usarlos. Las farmacias generalmente imprimen las instrucciones del medicamento slo en las cajas y   directamente en los tubos del medicamento.   Si su medicamento es muy caro, por favor, pngase en contacto con Zigmund Daniel llamando al [phone] y presione la opcin 4 o envenos un mensaje a travs de Pharmacist, community.   No podemos decirle cul ser su copago por los medicamentos por adelantado ya que esto es diferente dependiendo de la cobertura de su seguro. Sin embargo, es posible que podamos encontrar un medicamento sustituto a Electrical engineer un formulario para que el seguro cubra el medicamento que se considera necesario.   Si se requiere una autorizacin previa para que su compaa de seguros [place] su medicamento, por favor permtanos de 1 a 2 das hbiles para completar este proceso.  Los precios de los medicamentos varan con frecuencia dependiendo del Environmental consultant de dnde se surte la receta y alguna farmacias pueden ofrecer precios ms baratos.  El sitio web www.goodrx.com tiene cupones para medicamentos de Airline pilot. Los precios aqu no  tienen en cuenta lo que podra costar con la ayuda del seguro (puede ser ms barato con su seguro), pero el sitio web puede darle el precio si no utiliz Research scientist (physical sciences).  - Puede imprimir el cupn correspondiente y llevarlo con su receta a la farmacia.  - Tambin puede pasar por nuestra oficina durante el horario de atencin regular y Charity fundraiser una tarjeta de cupones de GoodRx.  - Si necesita que su receta se enve electrnicamente a una farmacia diferente, informe a nuestra oficina a travs de MyChart de [place] o por telfono llamando al [phone] y presione la opcin 4.

## 2021-11-22 NOTE — Progress Notes (Signed)
   Follow-Up Visit   Subjective  Samantha Woodard is a 81 y.o. female who presents for the following: Procedure (ATYPICAL INTRAEPIDERMAL MELANOCYTIC PROLIFERATION, biopsy proven, of the right medial posterior ankle. Patient presents for excision. ).   The following portions of the chart were reviewed this encounter and updated as appropriate:       Review of Systems:  No other skin or systemic complaints except as noted in HPI or Assessment and Plan.  Objective  Well appearing patient in no apparent distress; mood and affect are within normal limits.  A focused examination was performed including right ankle. Relevant physical exam findings are noted in the Assessment and Plan.  Right Medial Posterior Ankle Pink biopsy site with adjacent tan pigmentation    Assessment & Plan  Neoplasm of uncertain behavior of skin Right Medial Posterior Ankle  Skin excision  Lesion length (cm):  1.1 Lesion width (cm):  1.1 Margin per side (cm):  0.5 Total excision diameter (cm):  2.1 Informed consent: discussed and consent obtained   Timeout: patient name, date of birth, surgical site, and procedure verified   Procedure prep:  Patient was prepped and draped in usual sterile fashion Prep type:  Povidone-iodine Anesthesia: the lesion was anesthetized in a standard fashion   Anesthetic:  1% lidocaine w/ epinephrine 1-100,000 buffered w/ 8.4% NaHCO3 (Total 18 cc - 9cc lido w/epi, 9cc 0.5% bupivicaine) Instrument used: #15 blade   Hemostasis achieved with: pressure, aluminum chloride and electrodesiccation   Outcome: patient tolerated procedure well with no complications   Post-procedure details: sterile dressing applied and wound care instructions given   Dressing type: pressure dressing (mupirocin ointment)   Additional details:  Tag at 12 o'clock superior Excision site left open for secondary intention healing.  mupirocin ointment (BACTROBAN) 2 % Apply 1 Application topically daily. With  dressing changes  Specimen 1 - Surgical pathology Differential Diagnosis: Atypical Intraepidermal Melanocytic Proliferation Check Margins: Yes 1.1 cm Pink biopsy site with adjacent tan pigmentation [phone] Tag at 12 o'clock superior  BIOPSY PROVEN, ATYPICAL INTRAEPIDERMAL MELANOCYTIC PROLIFERATION, LATERAL AND DEEP MARGINS INVOLVED  Recommend patient use compression knee highs 15-20 mm/Hg. Patient will go to [facility] to be measured.   Start mupirocin 2% ointment Apply QD with bandage changes.  Wear compression hose daily   Return in about 16 days (around 12/08/2021) for post op and punch excision, dressing change tomorrow afternoon.  IJamesetta Orleans, CMA, am acting as scribe for Brendolyn Patty, MD .  Documentation: I have reviewed the above documentation for accuracy and completeness, and I agree with the above.  Brendolyn Patty MD

## 2021-11-23 ENCOUNTER — Telehealth: Payer: Self-pay

## 2021-11-23 ENCOUNTER — Ambulatory Visit (INDEPENDENT_AMBULATORY_CARE_PROVIDER_SITE_OTHER): Payer: Medicare Other | Admitting: Dermatology

## 2021-11-23 DIAGNOSIS — D485 Neoplasm of uncertain behavior of skin: Secondary | ICD-10-CM

## 2021-11-23 DIAGNOSIS — Z4802 Encounter for removal of sutures: Secondary | ICD-10-CM

## 2021-11-23 MED ORDER — DOXYCYCLINE MONOHYDRATE 100 MG PO CAPS: 100.0000 mg | ORAL_CAPSULE | Freq: Two times a day (BID) | ORAL | 0 refills | Status: DC

## 2021-11-23 NOTE — Progress Notes (Signed)
Patient here today for dressing change from surgical excision done yesterday on right medial posterior ankle, bx proven atypical melanocytic proliferation.   Pressure dressing removed, photo taken and placed in media, excision site was cleaned with puracyn, applied clean telfa with Mupirocin ointment and wrapped in coban.   Dr. Nicole Kindred examined patient today. All questions and concerns answered. Patient to keep follow up appointment in two weeks.  Johnsie Kindred, RMA  Documentation: I have reviewed the above documentation for accuracy and completeness, and I agree with the above.  Brendolyn Patty MD

## 2021-11-23 NOTE — Telephone Encounter (Signed)
Patient called in due to having a dental procedure tomorrow and a cyst drained on Friday. She is wanting to know if she needs any pre antibiotics since she just had surgery with Dr. Nicole Kindred yesterday.  Spoke with Dr. Nicole Kindred and she advised patient we can send in Doxycycline Mono '100mg'$  BID for 10 days to help with the upcoming procedures and reducing the risk of any infection. aw

## 2021-11-23 NOTE — Telephone Encounter (Signed)
Patient brought updated list of family history to dressing change today. Father HX of malignant melanoma (upper back) Sister HX of spindle cell melanoma, currently on Keytruda and scans are stable as of June 2023.

## 2021-11-25 ENCOUNTER — Telehealth: Payer: Self-pay

## 2021-11-25 NOTE — Telephone Encounter (Signed)
-----   Message from Brendolyn Patty, MD sent at 11/24/2021  7:01 PM EDT ----- Skin (M), right medial posterior ankle NO RESIDUAL ATYPICAL MELANOCYTIC PROLIFERATION  Margins Free- please call patient

## 2021-11-25 NOTE — Telephone Encounter (Signed)
Left pt msg to call for bx results/sh 

## 2021-11-25 NOTE — Telephone Encounter (Signed)
Patient advised of BX results .aw 

## 2021-12-08 ENCOUNTER — Ambulatory Visit (INDEPENDENT_AMBULATORY_CARE_PROVIDER_SITE_OTHER): Payer: Medicare Other | Admitting: Dermatology

## 2021-12-08 DIAGNOSIS — D485 Neoplasm of uncertain behavior of skin: Secondary | ICD-10-CM

## 2021-12-08 DIAGNOSIS — D2271 Melanocytic nevi of right lower limb, including hip: Secondary | ICD-10-CM

## 2021-12-08 DIAGNOSIS — D229 Melanocytic nevi, unspecified: Secondary | ICD-10-CM

## 2021-12-08 MED ORDER — MUPIROCIN 2 % EX OINT: 1.0000 | TOPICAL_OINTMENT | Freq: Every day | CUTANEOUS | 0 refills | Status: DC

## 2021-12-08 NOTE — Progress Notes (Signed)
   Follow-Up Visit   Subjective  Samantha Woodard is a 81 y.o. female who presents for the following: Follow-up.  Patient here for follow-up excision  The following portions of the chart were reviewed this encounter and updated as appropriate:       Review of Systems:  No other skin or systemic complaints except as noted in HPI or Assessment and Plan.  Objective  Well appearing patient in no apparent distress; mood and affect are within normal limits.  A focused examination was performed including back, right leg. Relevant physical exam findings are noted in the Assessment and Plan.  spinal mid upper back Pink biopsy site, 0.9 cm  right medial posterior ankle Ulceration healing well, is healing up from base    Assessment & Plan  Neoplasm of uncertain behavior of skin spinal mid upper back  Epidermal / dermal shaving  Lesion diameter (cm):  1.2 Informed consent: discussed and consent obtained   Patient was prepped and draped in usual sterile fashion: Area prepped with alcohol. Anesthesia: the lesion was anesthetized in a standard fashion   Anesthetic:  1% lidocaine w/ epinephrine 1-100,000 buffered w/ 8.4% NaHCO3 Instrument used: flexible razor blade   Hemostasis achieved with: pressure, aluminum chloride and electrodesiccation   Outcome: patient tolerated procedure well   Post-procedure details: wound care instructions given   Post-procedure details comment:  Ointment and small bandage applied  Specimen 1 - Surgical pathology Differential Diagnosis: Dysplastic Nevus with moderate to severe atypia Check Margins: Yes Pink biopsy site. [id number]  Related Medications mupirocin ointment (BACTROBAN) 2 % Apply 1 Application topically daily. With dressing changes  Atypical nevus right medial posterior ankle  Atypical intraepidermal melanocytic proliferation, margins free. Excised 11/22/2021 and left open for secondary intention healing.  Healing well. Continue  mupirocin 2% ointment and wrap daily, followed by compression knee highs.     Return in about 2 months (around 02/08/2022) for recheck leg.  IJamesetta Orleans, CMA, am acting as scribe for Brendolyn Patty, MD .  Documentation: I have reviewed the above documentation for accuracy and completeness, and I agree with the above.  Brendolyn Patty MD

## 2021-12-08 NOTE — Patient Instructions (Signed)
Wound Care Instructions  Cleanse wound gently with soap and water once a day then pat dry with clean gauze. Apply a thing coat of Petrolatum (petroleum jelly, "Vaseline") over the wound (unless you have an allergy to this). We recommend that you use a new, sterile tube of Vaseline. Do not pick or remove scabs. Do not remove the yellow or white "healing tissue" from the base of the wound.  Cover the wound with fresh, clean, nonstick gauze and secure with paper tape. You may use Band-Aids in place of gauze and tape if the would is small enough, but would recommend trimming much of the tape off as there is often too much. Sometimes Band-Aids can irritate the skin.  You should call the office for your biopsy report after 1 week if you have not already been contacted.  If you experience any problems, such as abnormal amounts of bleeding, swelling, significant bruising, significant pain, or evidence of infection, please call the office immediately.  FOR ADULT SURGERY PATIENTS: If you need something for pain relief you may take 1 extra strength Tylenol (acetaminophen) AND 2 Ibuprofen (200mg each) together every 4 hours as needed for pain. (do not take these if you are allergic to them or if you have a reason you should not take them.) Typically, you may only need pain medication for 1 to 3 days.    Due to recent changes in healthcare laws, you may see results of your pathology and/or laboratory studies on MyChart before the doctors have had a chance to review them. We understand that in some cases there may be results that are confusing or concerning to you. Please understand that not all results are received at the same time and often the doctors may need to interpret multiple results in order to provide you with the best plan of care or course of treatment. Therefore, we ask that you please give us 2 business days to thoroughly review all your results before contacting the office for clarification. Should we  see a critical lab result, you will be contacted sooner.   If You Need Anything After Your Visit  If you have any questions or concerns for your doctor, please call our main line at [phone] and press option 4 to reach your doctor's medical assistant. If no one answers, please leave a voicemail as directed and we will return your call as soon as possible. Messages left after 4 pm will be answered the following business day.   You may also send us a message via MyChart. We typically respond to MyChart messages within 1-2 business days.  For prescription refills, please ask your pharmacy to contact our office. Our fax number is [phone].  If you have an urgent issue when the clinic is closed that cannot wait until the next business day, you can page your doctor at the number below.    Please note that while we do our best to be available for urgent issues outside of office hours, we are not available 24/7.   If you have an urgent issue and are unable to reach us, you may choose to seek medical care at your doctor's office, retail clinic, urgent care center, or emergency room.  If you have a medical emergency, please immediately call 911 or go to the emergency department.  Pager Numbers  - Dr. Kowalski: [phone]  - Dr. Moye: [phone]  - Dr. Stewart: [phone]  In the event of inclement weather, please call our main line at [phone]   for an update on the status of any delays or closures.  Dermatology Medication Tips: Please keep the boxes that topical medications come in in order to help keep track of the instructions about where and how to use these. Pharmacies typically print the medication instructions only on the boxes and not directly on the medication tubes.   If your medication is too expensive, please contact our office at [phone] option 4 or send us a message through MyChart.   We are unable to tell what your co-pay for medications will be in advance  as this is different depending on your insurance coverage. However, we may be able to find a substitute medication at lower cost or fill out paperwork to get insurance to cover a needed medication.   If a prior authorization is required to get your medication covered by your insurance company, please allow us 1-2 business days to complete this process.  Drug prices often vary depending on where the prescription is filled and some pharmacies may offer cheaper prices.  The website www.goodrx.com contains coupons for medications through different pharmacies. The prices here do not account for what the cost may be with help from insurance (it may be cheaper with your insurance), but the website can give you the price if you did not use any insurance.  - You can print the associated coupon and take it with your prescription to the pharmacy.  - You may also stop by our office during regular business hours and pick up a GoodRx coupon card.  - If you need your prescription sent electronically to a different pharmacy, notify our office through [place] MyChart or by phone at [phone] option 4.     Si Usted Necesita Algo Despus de Su Visita  Tambin puede enviarnos un mensaje a travs de MyChart. Por lo general respondemos a los mensajes de MyChart en el transcurso de 1 a 2 das hbiles.  Para renovar recetas, por favor pida a su farmacia que se ponga en contacto con nuestra oficina. Nuestro nmero de fax es el [phone].  Si tiene un asunto urgente cuando la clnica est cerrada y que no puede esperar hasta el siguiente da hbil, puede llamar/localizar a su doctor(a) al nmero que aparece a continuacin.   Por favor, tenga en cuenta que aunque hacemos todo lo posible para estar disponibles para asuntos urgentes fuera del horario de oficina, no estamos disponibles las 24 horas del da, los 7 das de la semana.   Si tiene un problema urgente y no puede comunicarse con nosotros, puede optar  por buscar atencin mdica  en el consultorio de su doctor(a), en una clnica privada, en un centro de atencin urgente o en una sala de emergencias.  Si tiene una emergencia mdica, por favor llame inmediatamente al 911 o vaya a la sala de emergencias.  Nmeros de bper  - Dr. Kowalski: [phone]  - Dra. Moye: [phone]  - Dra. Stewart: [phone]  En caso de inclemencias del tiempo, por favor llame a nuestra lnea principal al [phone] para una actualizacin sobre el estado de cualquier retraso o cierre.  Consejos para la medicacin en dermatologa: Por favor, guarde las cajas en las que vienen los medicamentos de uso tpico para ayudarle a seguir las instrucciones sobre dnde y cmo usarlos. Las farmacias generalmente imprimen las instrucciones del medicamento slo en las cajas y no directamente en los tubos del medicamento.   Si su medicamento es muy caro, por favor, pngase en contacto con nuestra   oficina llamando al [phone] y presione la opcin 4 o envenos un mensaje a travs de MyChart.   No podemos decirle cul ser su copago por los medicamentos por adelantado ya que esto es diferente dependiendo de la cobertura de su seguro. Sin embargo, es posible que podamos encontrar un medicamento sustituto a menor costo o llenar un formulario para que el seguro cubra el medicamento que se considera necesario.   Si se requiere una autorizacin previa para que su compaa de seguros cubra su medicamento, por favor permtanos de 1 a 2 das hbiles para completar este proceso.  Los precios de los medicamentos varan con frecuencia dependiendo del lugar de dnde se surte la receta y alguna farmacias pueden ofrecer precios ms baratos.  El sitio web www.goodrx.com tiene cupones para medicamentos de diferentes farmacias. Los precios aqu no tienen en cuenta lo que podra costar con la ayuda del seguro (puede ser ms barato con su seguro), pero el sitio web puede darle el precio si  no utiliz ningn seguro.  - Puede imprimir el cupn correspondiente y llevarlo con su receta a la farmacia.  - Tambin puede pasar por nuestra oficina durante el horario de atencin regular y recoger una tarjeta de cupones de GoodRx.  - Si necesita que su receta se enve electrnicamente a una farmacia diferente, informe a nuestra oficina a travs de MyChart de [place] o por telfono llamando al [phone] y presione la opcin 4.  

## 2021-12-13 ENCOUNTER — Telehealth: Payer: Self-pay

## 2021-12-13 NOTE — Telephone Encounter (Signed)
-----   Message from Brendolyn Patty, MD sent at 12/11/2021 11:39 AM EDT ----- Skin , spinal mid upper back RESHAVE, SCAR, NO RESIDUAL DYSPLASTIC NEVUS   - please call patient

## 2021-12-13 NOTE — Telephone Encounter (Signed)
Discussed biopsy results with pt  °

## 2021-12-13 NOTE — Telephone Encounter (Signed)
Left pt message to call for bx results/sh °

## 2022-01-10 ENCOUNTER — Telehealth: Payer: Self-pay

## 2022-01-10 DIAGNOSIS — D485 Neoplasm of uncertain behavior of skin: Secondary | ICD-10-CM

## 2022-01-10 MED ORDER — MUPIROCIN 2 % EX OINT: 1.0000 | TOPICAL_OINTMENT | Freq: Every day | CUTANEOUS | 1 refills | Status: DC

## 2022-01-10 NOTE — Telephone Encounter (Signed)
Pt called requesting a refill of Mupirocin ointment be sent to [facility].   Ok Mupirocin ointment erx;d to [facility]

## 2022-01-13 ENCOUNTER — Ambulatory Visit: Payer: Medicare Other | Admitting: Podiatry

## 2022-01-13 ENCOUNTER — Encounter: Payer: Self-pay | Admitting: Podiatry

## 2022-01-13 DIAGNOSIS — B351 Tinea unguium: Secondary | ICD-10-CM | POA: Diagnosis not present

## 2022-01-13 DIAGNOSIS — M79676 Pain in unspecified toe(s): Secondary | ICD-10-CM | POA: Diagnosis not present

## 2022-01-13 DIAGNOSIS — N182 Chronic kidney disease, stage 2 (mild): Secondary | ICD-10-CM

## 2022-01-13 DIAGNOSIS — G63 Polyneuropathy in diseases classified elsewhere: Secondary | ICD-10-CM

## 2022-01-13 DIAGNOSIS — D689 Coagulation defect, unspecified: Secondary | ICD-10-CM | POA: Diagnosis not present

## 2022-01-13 NOTE — Progress Notes (Signed)
This patient returns to my office for at risk foot care.  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  This patient is unable to cut nails herself since the patient cannot reach her nails.These nails are painful walking and wearing shoes.  This patient presents for at risk foot care today.  General Appearance  Alert, conversant and in no acute stress.  Vascular  Dorsalis pedis and posterior tibial  pulses are palpable  bilaterally.  Capillary return is within normal limits  bilaterally. Temperature is within normal limits  bilaterally.  Neurologic  Senn-Weinstein monofilament wire test within normal limits  bilaterally. Muscle power within normal limits bilaterally.  Nails Thick disfigured discolored nails with subungual debris  from second  to fifth toes bilaterally. No evidence of bacterial infection or drainage bilaterally.  Orthopedic  No limitations of motion  feet .  No crepitus or effusions noted.  No bony pathology or digital deformities noted.  Skin  normotropic skin with no porokeratosis noted bilaterally.  No signs of infections or ulcers noted.     Onychomycosis  Pain in right toes  Pain in left toes  Consent was obtained for treatment procedures.   Mechanical debridement of nails 2-5  bilaterally performed with a nail nipper.  Filed with dremel without incident.    Return office visit   9 weeks                   Told patient to return for periodic foot care and evaluation due to potential at risk complications.   Gardiner Barefoot DPM

## 2022-01-17 IMAGING — MG DIGITAL SCREENING BILAT W/ TOMO W/ CAD
6 of 10 series · 6 of 30 positions shown · non-contrast
Comparison: Previous exam(s).

CLINICAL DATA: Screening.

EXAM:
DIGITAL SCREENING BILATERAL MAMMOGRAM WITH TOMO AND CAD

[R CC synth-2D (1 of 2)]
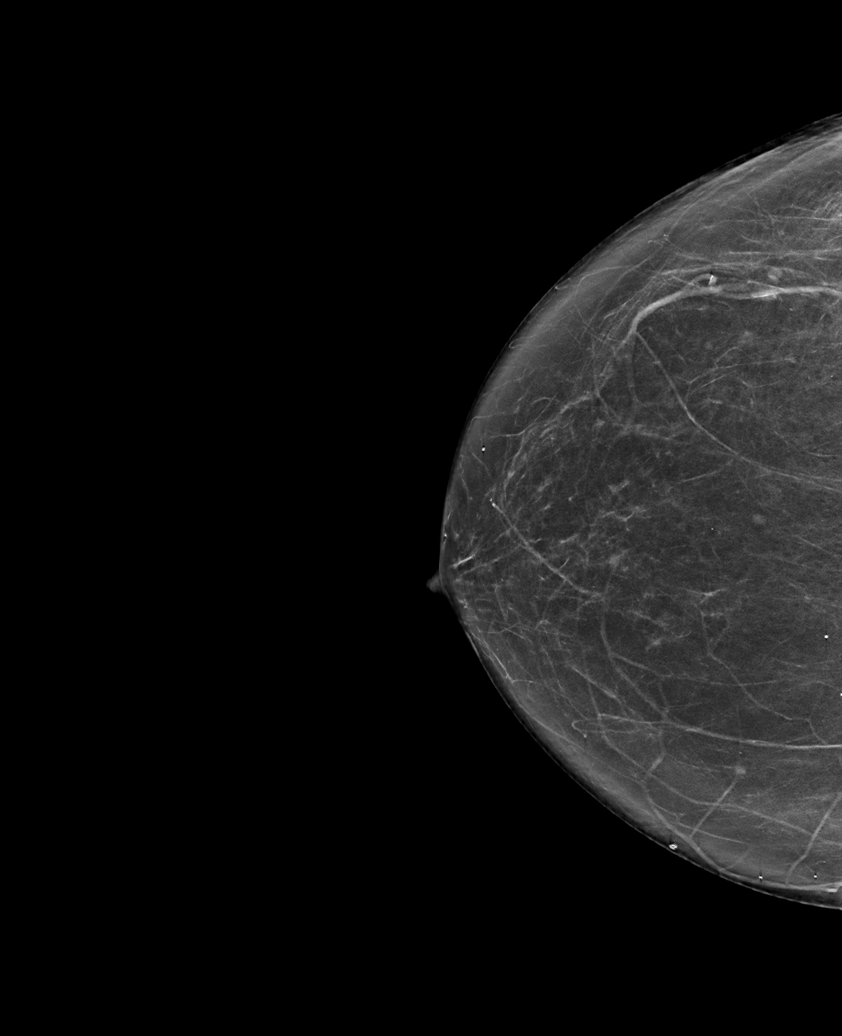

[L CC synth-2D]
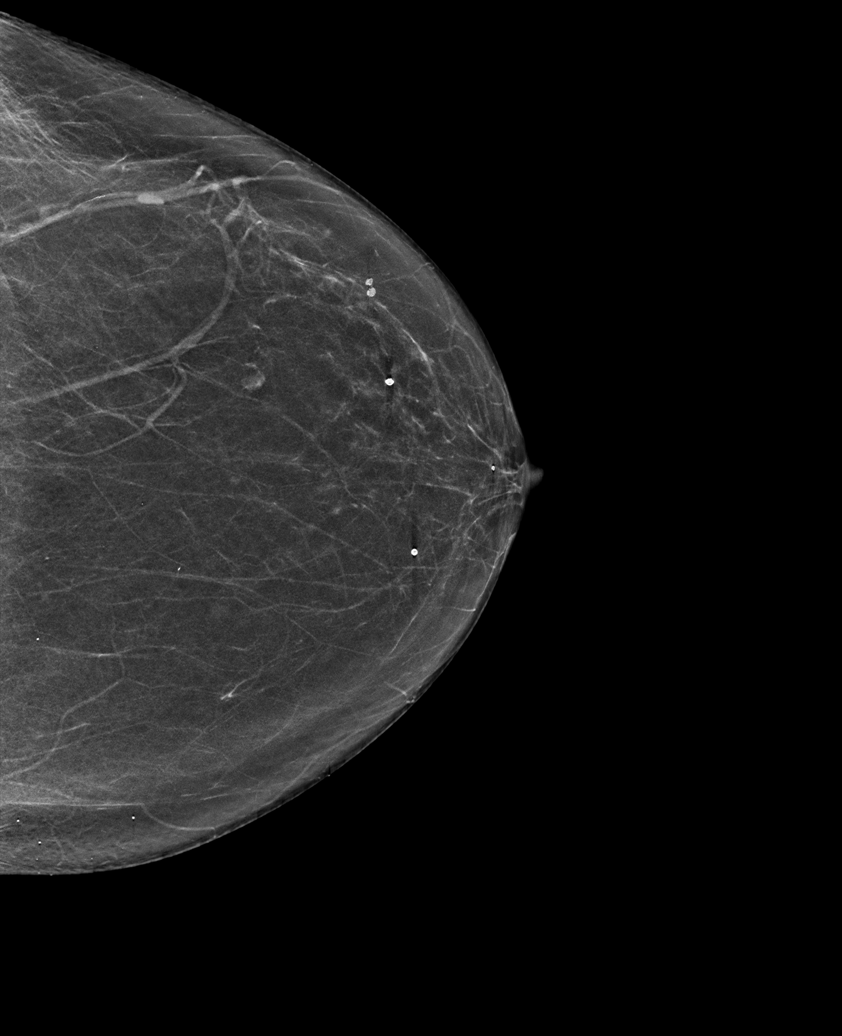

[L MLO synth-2D]
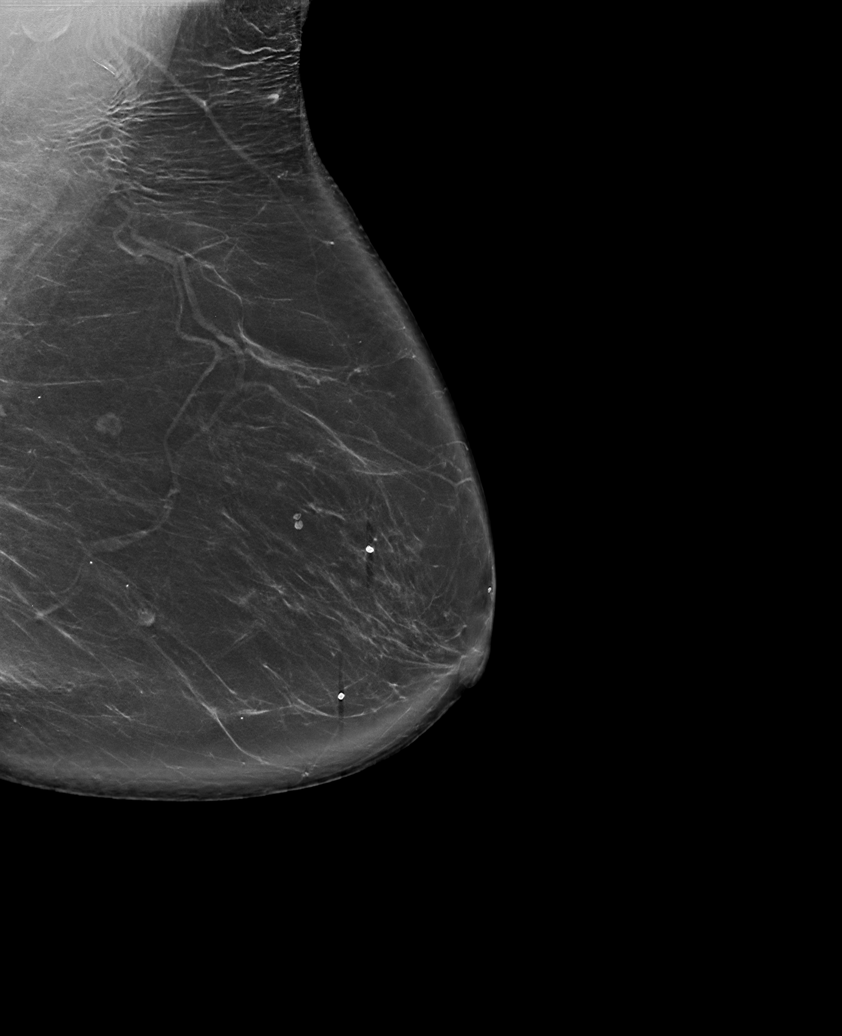

[R CC synth-2D (2 of 2)]
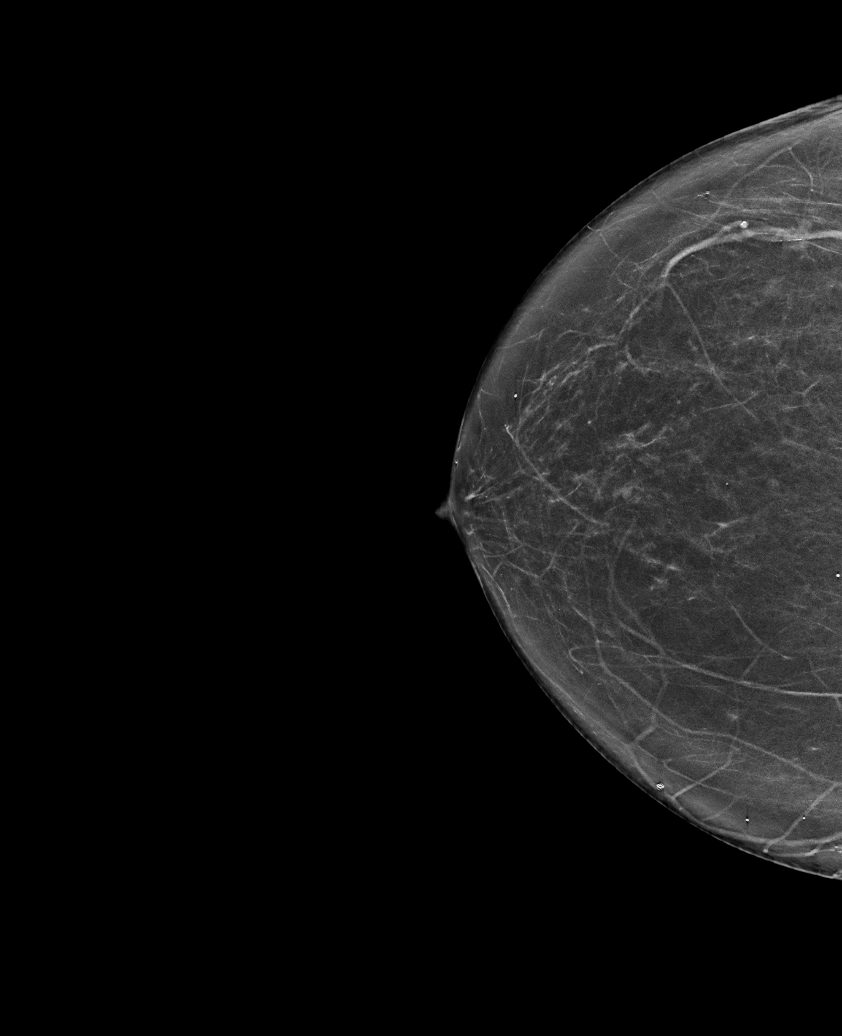

[R MLO synth-2D]
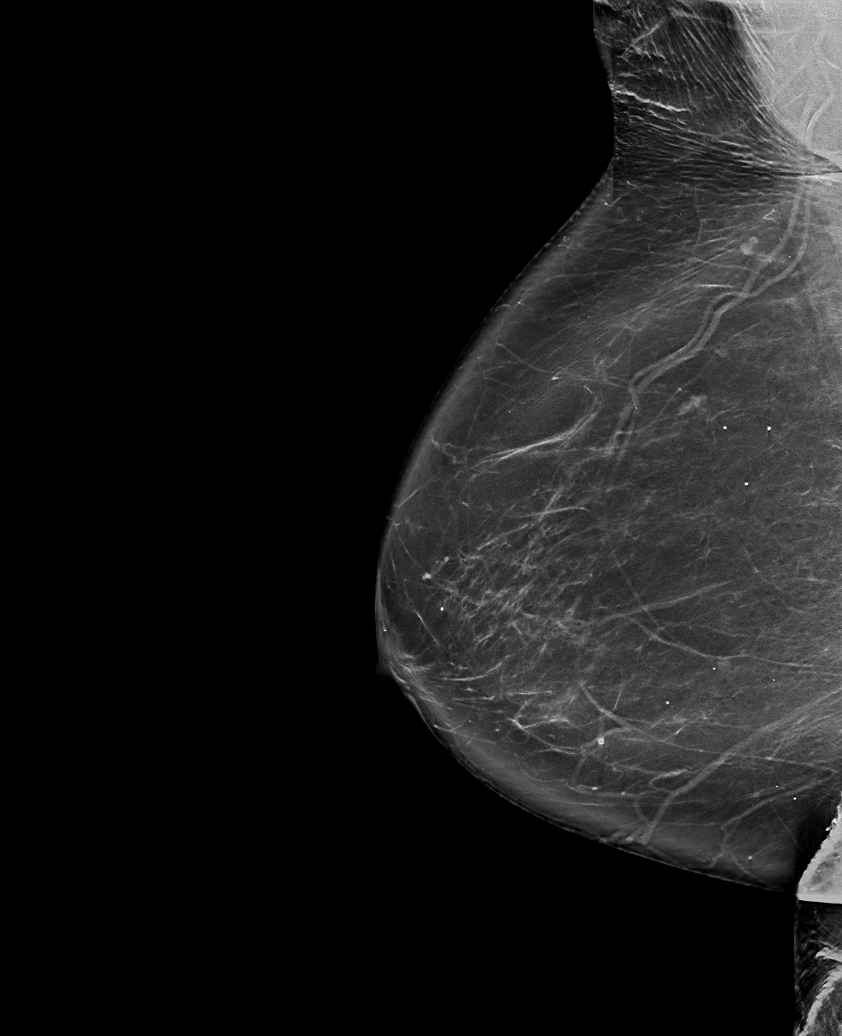

[R MLO tomo · tomo slice 45/89.0]
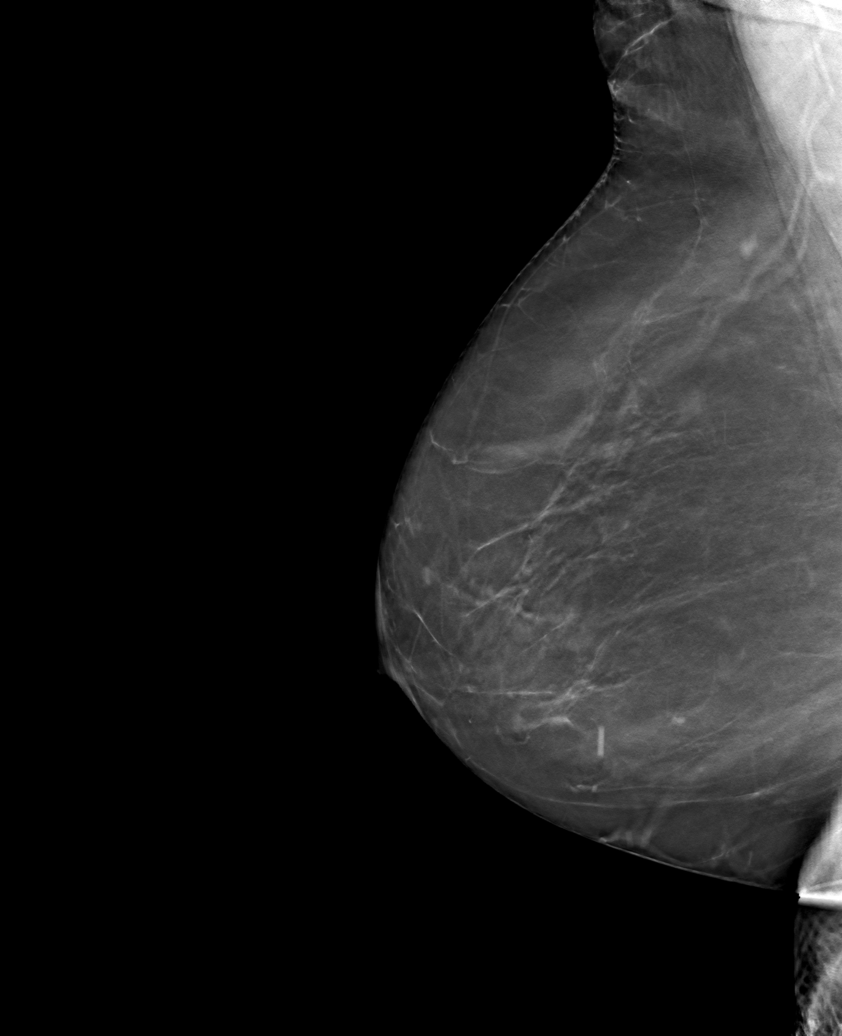

[6 of 30 positions shown; findings below may reference images not displayed]

ACR Breast Density Category b: There are scattered areas of
fibroglandular density.
FINDINGS: There are no findings suspicious for malignancy. Images were
processed with CAD.
IMPRESSION: No mammographic evidence of malignancy. A result letter of this
screening mammogram will be mailed directly to the patient.

RECOMMENDATION:
Screening mammogram in one year. (Code:[id number])

BI-RADS CATEGORY  1: Negative.

## 2022-02-04 DIAGNOSIS — R7303 Prediabetes: Secondary | ICD-10-CM | POA: Insufficient documentation

## 2022-02-08 ENCOUNTER — Ambulatory Visit: Payer: Medicare Other | Admitting: Dermatology

## 2022-02-08 DIAGNOSIS — Z86018 Personal history of other benign neoplasm: Secondary | ICD-10-CM

## 2022-02-08 DIAGNOSIS — L97911 Non-pressure chronic ulcer of unspecified part of right lower leg limited to breakdown of skin: Secondary | ICD-10-CM

## 2022-02-08 NOTE — Progress Notes (Signed)
   Follow-Up Visit   Subjective  Samantha Woodard is a 81 y.o. female who presents for the following: Follow-up (Patient here 2 month recheck of ) Right ankle. Atypical intraepidermal melanocytic proliferation, margins free. Excised 11/22/2021 and left open for secondary intention healing. Not completely healed, has raised above surrounding skin.    The following portions of the chart were reviewed this encounter and updated as appropriate:      Review of Systems: No other skin or systemic complaints except as noted in HPI or Assessment and Plan.   Objective  Well appearing patient in no apparent distress; mood and affect are within normal limits.  A focused examination was performed including right medial posterior ankle  . Relevant physical exam findings are noted in the Assessment and Plan.  right medial posterior ankle 1.5 cm thickened raised scar with mild moist yellow exudate, healing        Assessment & Plan  Lower extremity ulceration, right, limited to breakdown of skin (HCC) right medial posterior ankle  Atypical intraepidermal melanocytic proliferation, margins free. Excised 11/22/2021 and left open for secondary intention healing. Currently healing. Photo today   Recommend to start stratamed gel apply bid to affected area. Sample given along with coupon. 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      History of Dysplastic Nevi Spinal mid upper back - no residual dysplastic nevus 12/2021 - No evidence of recurrence today - Recommend regular full body skin exams - Recommend daily broad spectrum sunscreen SPF 30+ to sun-exposed areas, reapply every 2 hours as needed.  - Call if any new or changing lesions are noted between office visits  Return in about 2 months (around 04/10/2022) for recheck right ankle . I, Ruthell Rummage, CMA, am acting as scribe for Brendolyn Patty, MD.  Documentation: I have reviewed the above documentation for  accuracy and completeness, and I agree with the above.  Brendolyn Patty MD

## 2022-02-08 NOTE — Patient Instructions (Signed)
Due to recent changes in healthcare laws, you may see results of your pathology and/or laboratory studies on MyChart before the doctors have had a chance to review them. We understand that in some cases there may be results that are confusing or concerning to you. Please understand that not all results are received at the same time and often the doctors may need to interpret multiple results in order to provide you with the best plan of care or course of treatment. Therefore, we ask that you please give us 2 business days to thoroughly review all your results before contacting the office for clarification. Should we see a critical lab result, you will be contacted sooner.   If You Need Anything After Your Visit  If you have any questions or concerns for your doctor, please call our main line at [phone] and press option 4 to reach your doctor's medical assistant. If no one answers, please leave a voicemail as directed and we will return your call as soon as possible. Messages left after 4 pm will be answered the following business day.   You may also send us a message via MyChart. We typically respond to MyChart messages within 1-2 business days.  For prescription refills, please ask your pharmacy to contact our office. Our fax number is [phone].  If you have an urgent issue when the clinic is closed that cannot wait until the next business day, you can page your doctor at the number below.    Please note that while we do our best to be available for urgent issues outside of office hours, we are not available 24/7.   If you have an urgent issue and are unable to reach us, you may choose to seek medical care at your doctor's office, retail clinic, urgent care center, or emergency room.  If you have a medical emergency, please immediately call 911 or go to the emergency department.  Pager Numbers  - Dr. Kowalski: [phone]  - Dr. Moye: [phone]  - Dr. Stewart:  [phone]  In the event of inclement weather, please call our main line at [phone] for an update on the status of any delays or closures.  Dermatology Medication Tips: Please keep the boxes that topical medications come in in order to help keep track of the instructions about where and how to use these. Pharmacies typically print the medication instructions only on the boxes and not directly on the medication tubes.   If your medication is too expensive, please contact our office at [phone] option 4 or send us a message through MyChart.   We are unable to tell what your co-pay for medications will be in advance as this is different depending on your insurance coverage. However, we may be able to find a substitute medication at lower cost or fill out paperwork to get insurance to cover a needed medication.   If a prior authorization is required to get your medication covered by your insurance company, please allow us 1-2 business days to complete this process.  Drug prices often vary depending on where the prescription is filled and some pharmacies may offer cheaper prices.  The website www.goodrx.com contains coupons for medications through different pharmacies. The prices here do not account for what the cost may be with help from insurance (it may be cheaper with your insurance), but the website can give you the price if you did not use any insurance.  - You can print the associated coupon and take it with   your prescription to the pharmacy.  - You may also stop by our office during regular business hours and pick up a GoodRx coupon card.  - If you need your prescription sent electronically to a different pharmacy, notify our office through [place] MyChart or by phone at [phone] option 4.     Si Usted Necesita Algo Despus de Su Visita  Tambin puede enviarnos un mensaje a travs de MyChart. Por lo general respondemos a los mensajes de MyChart en el transcurso de 1 a 2  das hbiles.  Para renovar recetas, por favor pida a su farmacia que se ponga en contacto con nuestra oficina. Nuestro nmero de fax es el [phone].  Si tiene un asunto urgente cuando la clnica est cerrada y que no puede esperar hasta el siguiente da hbil, puede llamar/localizar a su doctor(a) al nmero que aparece a continuacin.   Por favor, tenga en cuenta que aunque hacemos todo lo posible para estar disponibles para asuntos urgentes fuera del horario de oficina, no estamos disponibles las 24 horas del da, los 7 das de la semana.   Si tiene un problema urgente y no puede comunicarse con nosotros, puede optar por buscar atencin mdica  en el consultorio de su doctor(a), en una clnica privada, en un centro de atencin urgente o en una sala de emergencias.  Si tiene una emergencia mdica, por favor llame inmediatamente al 911 o vaya a la sala de emergencias.  Nmeros de bper  - Dr. Kowalski: [phone]  - Dra. Moye: [phone]  - Dra. Stewart: [phone]  En caso de inclemencias del tiempo, por favor llame a nuestra lnea principal al [phone] para una actualizacin sobre el estado de cualquier retraso o cierre.  Consejos para la medicacin en dermatologa: Por favor, guarde las cajas en las que vienen los medicamentos de uso tpico para ayudarle a seguir las instrucciones sobre dnde y cmo usarlos. Las farmacias generalmente imprimen las instrucciones del medicamento slo en las cajas y no directamente en los tubos del medicamento.   Si su medicamento es muy caro, por favor, pngase en contacto con nuestra oficina llamando al [phone] y presione la opcin 4 o envenos un mensaje a travs de MyChart.   No podemos decirle cul ser su copago por los medicamentos por adelantado ya que esto es diferente dependiendo de la cobertura de su seguro. Sin embargo, es posible que podamos encontrar un medicamento sustituto a menor costo o llenar un formulario para que el  seguro cubra el medicamento que se considera necesario.   Si se requiere una autorizacin previa para que su compaa de seguros cubra su medicamento, por favor permtanos de 1 a 2 das hbiles para completar este proceso.  Los precios de los medicamentos varan con frecuencia dependiendo del lugar de dnde se surte la receta y alguna farmacias pueden ofrecer precios ms baratos.  El sitio web www.goodrx.com tiene cupones para medicamentos de diferentes farmacias. Los precios aqu no tienen en cuenta lo que podra costar con la ayuda del seguro (puede ser ms barato con su seguro), pero el sitio web puede darle el precio si no utiliz ningn seguro.  - Puede imprimir el cupn correspondiente y llevarlo con su receta a la farmacia.  - Tambin puede pasar por nuestra oficina durante el horario de atencin regular y recoger una tarjeta de cupones de GoodRx.  - Si necesita que su receta se enve electrnicamente a una farmacia diferente, informe a nuestra oficina a travs de MyChart de [place]   o por telfono llamando al [phone] y presione la opcin 4.  

## 2022-03-18 ENCOUNTER — Other Ambulatory Visit: Payer: Self-pay | Admitting: Internal Medicine

## 2022-03-18 DIAGNOSIS — Z1231 Encounter for screening mammogram for malignant neoplasm of breast: Secondary | ICD-10-CM

## 2022-03-31 ENCOUNTER — Ambulatory Visit: Payer: Medicare Other | Admitting: Podiatry

## 2022-04-12 ENCOUNTER — Encounter: Payer: Self-pay | Admitting: Dermatology

## 2022-04-12 ENCOUNTER — Ambulatory Visit: Payer: Medicare Other | Admitting: Dermatology

## 2022-04-12 DIAGNOSIS — D2271 Melanocytic nevi of right lower limb, including hip: Secondary | ICD-10-CM | POA: Diagnosis not present

## 2022-04-12 DIAGNOSIS — D229 Melanocytic nevi, unspecified: Secondary | ICD-10-CM

## 2022-04-12 NOTE — Patient Instructions (Addendum)
Recommend starting moisturizer with exfoliant (Urea, Salicylic acid, or Lactic acid) one to two times daily to help smooth rough and bumpy skin.  OTC options include Cetaphil Rough and Bumpy lotion (Urea), Eucerin Roughness Relief lotion or spot treatment cream (Urea), CeraVe SA lotion/cream for Rough and Bumpy skin (Sal Acid), Gold Bond Rough and Bumpy cream (Sal Acid), and AmLactin 12% lotion/cream (Lactic Acid).  If applying in morning, also apply sunscreen to sun-exposed areas, since these exfoliating moisturizers can increase sensitivity to sun.   Due to recent changes in healthcare laws, you may see results of your pathology and/or laboratory studies on MyChart before the doctors have had a chance to review them. We understand that in some cases there may be results that are confusing or concerning to you. Please understand that not all results are received at the same time and often the doctors may need to interpret multiple results in order to provide you with the best plan of care or course of treatment. Therefore, we ask that you please give Samantha Woodard 2 business days to thoroughly review all your results before contacting the office for clarification. Should we see a critical lab result, you will be contacted sooner.   If You Need Anything After Your Visit  If you have any questions or concerns for your doctor, please call our main line at [phone] and press option 4 to reach your doctor's medical assistant. If no one answers, please leave a voicemail as directed and we will return your call as soon as possible. Messages left after 4 pm will be answered the following business day.   You may also send Samantha Woodard a message via [place]. We typically respond to MyChart messages within 1-2 business days.  For prescription refills, please ask your pharmacy to contact our office. Our fax number is [phone].  If you have an urgent issue when the clinic is closed that cannot wait until the next business day,  you can page your doctor at the number below.    Please note that while we do our best to be available for urgent issues outside of office hours, we are not available 24/7.   If you have an urgent issue and are unable to reach Samantha Woodard, you may choose to seek medical care at your doctor's office, retail clinic, urgent care center, or emergency room.  If you have a medical emergency, please immediately call 911 or go to the emergency department.  Pager Numbers  - Dr. Nehemiah Massed: [phone]  - Dr. Laurence Ferrari: [phone]  - Dr. Nicole Kindred: [phone]  In the event of inclement weather, please call our main line at [phone] for an update on the status of any delays or closures.  Dermatology Medication Tips: Please keep the boxes that topical medications come in in order to help keep track of the instructions about where and how to use these. Pharmacies typically print the medication instructions only on the boxes and not directly on the medication tubes.   If your medication is too expensive, please contact our office at [phone] option 4 or send Samantha Woodard a message through [place].   We are unable to tell what your co-pay for medications will be in advance as this is different depending on your insurance coverage. However, we may be able to find a substitute medication at lower cost or fill out paperwork to get insurance to cover a needed medication.   If a prior authorization is required to get your medication covered by your insurance company, please allow  Samantha Woodard 1-2 business days to complete this process.  Drug prices often vary depending on where the prescription is filled and some pharmacies may offer cheaper prices.  The website www.goodrx.com contains coupons for medications through different pharmacies. The prices here do not account for what the cost may be with help from insurance (it may be cheaper with your insurance), but the website can give you the price if you did not use any insurance.   - You can print the associated coupon and take it with your prescription to the pharmacy.  - You may also stop by our office during regular business hours and pick up a GoodRx coupon card.  - If you need your prescription sent electronically to a different pharmacy, notify our office through [facility] or by phone at [phone] option 4.     Si Usted Necesita Algo Despus de Su Visita  Tambin puede enviarnos un mensaje a travs de Pharmacist, community. Por lo general respondemos a los mensajes de MyChart en el transcurso de 1 a 2 das hbiles.  Para renovar recetas, por favor pida a su farmacia que se ponga en contacto con nuestra oficina. Harland Dingwall de fax es [place] [phone].  Si tiene un asunto urgente cuando la clnica est cerrada y que no puede esperar hasta el siguiente da hbil, puede llamar/localizar a su doctor(a) al nmero que aparece a continuacin.   Por favor, tenga en cuenta que aunque hacemos todo lo posible para estar disponibles para asuntos urgentes fuera del horario de [place], no estamos disponibles las 24 horas del da, los 7 das de la [place].   Si tiene un problema urgente y no puede comunicarse con nosotros, puede optar por buscar atencin mdica  en el consultorio de su doctor(a), en una clnica privada, en un centro de atencin urgente o en una sala de emergencias.  Si tiene Engineering geologist, por favor llame inmediatamente al 911 o vaya a la sala de emergencias.  Nmeros de bper  - Dr. Nehemiah Massed: [phone]  - Dra. Moye: [phone]  - Dra. Nicole Kindred: [phone]  En caso de inclemencias del [place], por favor llame a Johnsie Kindred principal al [phone] para una actualizacin sobre el [place] de cualquier retraso o cierre.  Consejos para la medicacin en dermatologa: Por favor, guarde las cajas en las que vienen los medicamentos de uso tpico para ayudarle a seguir las instrucciones sobre dnde y cmo usarlos. Las farmacias generalmente  imprimen las instrucciones del medicamento slo en las cajas y no directamente en los tubos del [place].   Si su medicamento es muy caro, por favor, pngase en contacto con Zigmund Daniel llamando al [phone] y presione la opcin 4 o envenos un mensaje a travs de Pharmacist, community.   No podemos decirle cul ser su copago por los medicamentos por adelantado ya que esto es diferente dependiendo de la cobertura de su seguro. Sin embargo, es posible que podamos encontrar un medicamento sustituto a Electrical engineer un formulario para que el seguro cubra el medicamento que se considera necesario.   Si se requiere una autorizacin previa para que su compaa de seguros [place] su medicamento, por favor permtanos de 1 a 2 das hbiles para completar este proceso.  Los precios de los medicamentos varan con frecuencia dependiendo del Environmental consultant de dnde se surte la receta y alguna farmacias pueden ofrecer precios ms baratos.  El sitio web www.goodrx.com tiene cupones para medicamentos de Airline pilot. Los precios aqu no tienen en cuenta lo que podra  costar con la ayuda del seguro (puede ser ms barato con su seguro), pero el sitio web puede darle el precio si no Field seismologist.  - Puede imprimir el cupn correspondiente y llevarlo con su receta a la farmacia.  - Tambin puede pasar por nuestra oficina durante el horario de atencin regular y Charity fundraiser una tarjeta de cupones de GoodRx.  - Si necesita que su receta se enve electrnicamente a una farmacia diferente, informe a nuestra oficina a travs de MyChart de  o por telfono llamando al [phone] y presione la opcin 4.

## 2022-04-12 NOTE — Progress Notes (Signed)
   Follow-Up Visit   Subjective  Samantha Woodard is a 81 y.o. female who presents for the following: Follow-up.  Patient presents for 2 month recheck history of atypical intraepidermal melanocytic proliferation of the right medial posterior ankle. Excised 11/22/2021 and left open for secondary intention healing. Healing well per patient.   The following portions of the chart were reviewed this encounter and updated as appropriate:       Review of Systems:  No other skin or systemic complaints except as noted in HPI or Assessment and Plan.  Objective  Well appearing patient in no apparent distress; mood and affect are within normal limits.  A focused examination was performed including face, right leg. Relevant physical exam findings are noted in the Assessment and Plan.  R med post ankle Round, pink/white stippled scar with central hyperkeratosis.    Assessment & Plan  Atypical nevus R med post ankle  Hx of Atypical intraepidermal melanocytic proliferation of the right medial posterior ankle - margins free. Excised 11/22/2021 and left open for secondary intention healing. Well-healed. Clear without recurrence.  Recommend starting moisturizer with exfoliant (Urea, Salicylic acid, or Lactic acid) one to two times daily to help smooth rough and bumpy skin.  OTC options include Cetaphil Rough and Bumpy lotion (Urea), Eucerin Roughness Relief lotion or spot treatment cream (Urea), CeraVe SA lotion/cream for Rough and Bumpy skin (Sal Acid), Gold Bond Rough and Bumpy cream (Sal Acid), and AmLactin 12% lotion/cream (Lactic Acid).  If applying in morning, also apply sunscreen to sun-exposed areas, since these exfoliating moisturizers can increase sensitivity to sun. Samples of CeraVe SA Cream and CeraVe Psoriasis Cream given today.     Return as scheduled, for TBSE, Hx DN.  IJamesetta Orleans, CMA, am acting as scribe for Brendolyn Patty, MD .  Documentation: I have reviewed the above  documentation for accuracy and completeness, and I agree with the above.  Brendolyn Patty MD

## 2022-04-19 ENCOUNTER — Ambulatory Visit: Payer: Medicare Other | Admitting: Licensed Practical Nurse

## 2022-04-27 ENCOUNTER — Ambulatory Visit: Payer: Medicare Other | Admitting: Podiatry

## 2022-04-27 DIAGNOSIS — M79676 Pain in unspecified toe(s): Secondary | ICD-10-CM | POA: Diagnosis not present

## 2022-04-27 DIAGNOSIS — B351 Tinea unguium: Secondary | ICD-10-CM | POA: Diagnosis not present

## 2022-04-27 NOTE — Progress Notes (Signed)
  Subjective:  Patient ID: Samantha Woodard, female    DOB: 10/21/1940,  MRN: [id number]  Chief Complaint  Patient presents with   Nail Problem    Thick painful toenails, 3 month follow up    81 y.o. female presents with the above complaint. History confirmed with patient.   Objective:  Physical Exam: warm, good capillary refill, no trophic changes or ulcerative lesions, normal DP and PT pulses, and normal sensory exam. Left Foot: dystrophic yellowed discolored nail plates with subungual debris Right Foot: dystrophic yellowed discolored nail plates with subungual debris   Assessment:   1. Pain due to onychomycosis of toenail      Plan:  Patient was evaluated and treated and all questions answered.   Discussed the etiology and treatment options for the condition in detail with the patient. Educated patient on the topical and oral treatment options for mycotic nails. Recommended debridement of the nails today. Sharp and mechanical debridement performed of all painful and mycotic nails today. Nails debrided in length and thickness using a nail nipper to level of comfort. Discussed treatment options including appropriate shoe gear. Follow up as needed for painful nails.    Return in about 3 months (around 07/28/2022) for painful thick nails.

## 2022-05-08 NOTE — Progress Notes (Unsigned)
Who presents for follow-up of her cardiology Office Note  Date:  05/09/2022   ID:  Samantha Woodard, DOB: 03-31-1941, MRN: [id number]  PCP:  Gladstone Lighter, MD   Chief Complaint  Patient presents with   1 year follow up     Patient c/o shortness of breath with over exertion with mostly due to her back issues. Medications reviewed by the patient verbally.     HPI:  Samantha Woodard is a 81 y.o. female with a PMHx of: Paroxysmal tachycardia Anemia CKD HTN Dyslipidemia Varicose veins of lower extremities  TIA, on aspirin Plavix GERD Spina bifida occulta, Scoliosis  Mild mitral valve prolapse per old echo OSA Left knee replacement Obesity Degenerative Arthiritis Family history of heart disease.  Non smoker Cardiac cath in the 1980s Who presents for follow-up of her chest pain, tachycardia, shortness of breath  LOV 11/22 Underwent knee surgery, right knee Recovered well Mobility limited by chronic back pain, scoliosis  Reports having some shortness of breath No chest pain or angina symptoms, Chronic stable mild lower extremity edema No PND orthopnea No ecardiogram on record  Labs reviewed Total chol 162 LDL 83 CR 1.1 TSH 2.88, hemoglobin 12.6  EKG personally reviewed by myself on todays visit Normal sinus rhythm rate 79 bpm no significant ST-T wave changes  Prior history reviewed Atypical chest discomfort on last visit 2 years ago, underwent stress test 07/2018 Pharmacological myocardial perfusion imaging study with no significant  ischemia Normal wall motion, EF estimated at 73% No EKG changes concerning for ischemia at peak stress or in recovery. Low risk scan  PMH:   has a past medical history of Anemia, Anginal pain ([place]), Anxiety and depression, Arthritis, degenerative, Atrial fibrillation ([place]), Atypical mole (10/26/2021), Cerebrovascular disease, Chronic kidney disease, Chronic low back pain, Complication of anesthesia (1970's), Diverticulosis, Dyslipidemia,  Dysplastic nevus (10/26/2021), Fibromyalgia, GERD (gastroesophageal reflux disease), History of hiatal hernia, HTN (hypertension), IBS (irritable bowel syndrome), Mild mitral valve prolapse, Obesity, OSA (obstructive sleep apnea), Peripheral neuropathy, Peripheral vascular disease ([place]), Positional vertigo, Rosacea, Rosacea, Scoliosis, Spina bifida occulta, Stroke ([place]), and Varicose vein.  PSH:    Past Surgical History:  Procedure Laterality Date   CARDIAC CATHETERIZATION  1990's   no blockage    CHOLECYSTECTOMY     COLONOSCOPY     ENDOVENOUS ABLATION SAPHENOUS VEIN W/ LASER  10/22/2007   ESOPHAGOGASTRODUODENOSCOPY     gallbladder resection     JOINT REPLACEMENT Right 2022   right knee   MINOR HEMORRHOIDECTOMY     POLYPECTOMY     TONSILLECTOMY     TOTAL KNEE ARTHROPLASTY Left 11/28/2016   Procedure: TOTAL KNEE ARTHROPLASTY;  Surgeon: Frederik Pear, MD;  Location: [facility];  Service: Orthopedics;  Laterality: Left;   VAGINAL HYSTERECTOMY      Current Outpatient Medications  Medication Sig Dispense Refill   acetaminophen (TYLENOL) 500 MG tablet Take 500 mg by mouth every 6 (six) hours as needed.      aspirin EC 81 MG tablet Take 81 mg by mouth daily with supper.     carisoprodol (SOMA) 350 MG tablet Take 175-350 mg by mouth daily as needed for muscle spasms.     celecoxib (CELEBREX) 200 MG capsule Take 200 mg by mouth daily with lunch.      cetirizine (ZYRTEC) 10 MG tablet Take 10 mg by mouth at bedtime.     clopidogrel (PLAVIX) 75 MG tablet Take 0.5 tablets (37.5 mg total) by mouth daily. 90 tablet 0   cyanocobalamin (VITAMIN B12)  1000 MCG tablet Take 1,000 mcg by mouth daily.     fluticasone (FLONASE) 50 MCG/ACT nasal spray Place 1 spray into the nose at bedtime.     gabapentin (NEURONTIN) 600 MG tablet Take 1 tablet (600 mg total) by mouth 3 (three) times daily. 270 tablet 0   hydrochlorothiazide (HYDRODIURIL) 12.5 MG tablet Take 12.5 mg by mouth daily with lunch.      lisinopril  (ZESTRIL) 5 MG tablet Take 2.5 mg by mouth daily.     Multiple Vitamin (MULTIVITAMIN WITH MINERALS) TABS tablet Take 1 tablet by mouth daily with supper. WOMEN'S 50+     pantoprazole (PROTONIX) 40 MG tablet Take 40 mg by mouth 2 (two) times daily before a meal. 30 MINS TO 1 HOUR BEFORE EATING.     Probiotic Product (ALIGN) 4 MG CAPS Take 4 mg by mouth daily with lunch.     Simethicone 180 MG CAPS Take 180 mg by mouth 2 (two) times daily as needed (FOR BLOATING/GAS). phazyme     simvastatin (ZOCOR) 20 MG tablet Take 20 mg by mouth at bedtime.     tetrahydrozoline-zinc (VISINE-AC) 0.05-0.25 % ophthalmic solution Place 2 drops into both eyes 3 (three) times daily as needed (for redness/itchy eyes.).     Wheat Dextrin (BENEFIBER) POWD Take 22.5 mLs by mouth 2 (two) times daily.     No current facility-administered medications for this visit.    ALLERGIES:   Neomycin, Silicone, Tape, Iodine, Iodinated contrast media, and Tizanidine   SOCIAL HISTORY:  The patient  reports that she has never smoked. She has never used smokeless tobacco. She reports that she does not drink alcohol and does not use drugs.   FAMILY HISTORY:   family history includes Cancer in her father and mother; Carpal tunnel syndrome in her sister; Cerebrovascular Disease in her maternal grandmother; Diabetes in her sister; Heart attack in her father and mother; Heart disease in her father; Hyperlipidemia in her brother, sister, and sister; Hypertension in her brother, father, mother, sister, and sister; Melanoma in her father and sister; Stroke in her paternal grandmother; Varicose Veins in her mother, sister, and sister.    REVIEW OF SYSTEMS: Review of Systems  Constitutional: Negative.   HENT: Negative.    Eyes: Negative.   Respiratory: Negative.    Cardiovascular: Negative.   Gastrointestinal: Negative.   Genitourinary: Negative.   Musculoskeletal: Negative.   Neurological: Negative.   Psychiatric/Behavioral: Negative.     All other systems reviewed and are negative.   PHYSICAL EXAM: VS:  BP 130/80 (BP Location: Left Arm, Patient Position: Sitting, Cuff Size: Normal)   Pulse 79   Ht 5' 7.5" (1.715 m)   Wt 233 lb 6 oz (105.9 kg)   SpO2 98%   BMI 36.01 kg/m  , BMI Body mass index is 36.01 kg/m. Constitutional:  oriented to person, place, and time. No distress.  HENT:  Head: Grossly normal Eyes:  no discharge. No scleral icterus.  Neck: No JVD, no carotid bruits  Cardiovascular: Regular rate and rhythm, no murmurs appreciated Pulmonary/Chest: Clear to auscultation bilaterally, no wheezes or rails Abdominal: Soft.  no distension.  no tenderness.  Musculoskeletal: Normal range of motion Neurological:  normal muscle tone. Coordination normal. No atrophy Skin: Skin warm and dry Psychiatric: normal affect, pleasant  RECENT LABS: No results found for requested labs within last 365 days.    LIPID PANEL: No results found for: "CHOL", "HDL", "LDLCALC", "TRIG"    WEIGHT: Wt Readings from Last 3 Encounters:  05/09/22 233 lb 6 oz (105.9 kg)  04/09/21 226 lb (102.5 kg)  04/06/21 225 lb 8 oz (102.3 kg)     ASSESSMENT AND PLAN:  Chest pain with moderate risk for cardiac etiology  Atypical chest pain several years ago, stress test with no ischemia no further testing needed  Shortness of breath Likely multifactorial including deconditioning, weight Unable to exclude structural heart disease, baseline echocardiogram has been ordered  Morbid obesity Exercise limited secondary to arthritides knee and back pain We have encouraged exercise program, careful diet management in an effort to lose weight.  Hyperlipidemia Cholesterol at goal on simvastatin  TIAs on aspirin 1/2 dose Plavix By her report TIA 1999 was put on aspirin Plavix at that time Reports having minimal carotid disease on prior study  Stage II chronic kidney disease CR 1.1, stable  GERD Reports having GERD and hiatal hernia  symptoms On PPI, stable  Chronic back pain Limited exercise capacity   Total encounter time more than 30 minutes  Greater than 50% was spent in counseling and coordination of care with the patient    Orders Placed This Encounter  Procedures   EKG 12-Lead      Signed, Esmond Plants, M.D., Ph.D. 05/09/2022  [facility] [place], [place] [phone]

## 2022-05-09 ENCOUNTER — Ambulatory Visit: Payer: Medicare Other | Attending: Cardiovascular Disease | Admitting: Cardiovascular Disease

## 2022-05-09 ENCOUNTER — Encounter: Payer: Self-pay | Admitting: Cardiovascular Disease

## 2022-05-09 VITALS — BP 130/80 | HR 79 | Ht 67.5 in | Wt 233.4 lb

## 2022-05-09 DIAGNOSIS — R0602 Shortness of breath: Secondary | ICD-10-CM

## 2022-05-09 DIAGNOSIS — G459 Transient cerebral ischemic attack, unspecified: Secondary | ICD-10-CM | POA: Diagnosis not present

## 2022-05-09 DIAGNOSIS — I479 Paroxysmal tachycardia, unspecified: Secondary | ICD-10-CM

## 2022-05-09 DIAGNOSIS — E782 Mixed hyperlipidemia: Secondary | ICD-10-CM

## 2022-05-09 DIAGNOSIS — M797 Fibromyalgia: Secondary | ICD-10-CM | POA: Diagnosis not present

## 2022-05-09 NOTE — Patient Instructions (Addendum)
Medication Instructions:  No changes  If you need a refill on your cardiac medications before your next appointment, please call your pharmacy.   Lab work: No new labs needed  Testing/Procedures: Your physician has requested that you have an echocardiogram. Echocardiography is a painless test that uses sound waves to create images of your heart. It provides your doctor with information about the size and shape of your heart and how well your heart's chambers and valves are working. This procedure takes approximately one hour. There are no restrictions for this procedure. Please do NOT wear cologne, perfume, aftershave, or lotions (deodorant is allowed). Please arrive 15 minutes prior to your appointment time.  Follow-Up: At [facility], you and your health needs are our priority.  As part of our continuing mission to provide you with exceptional heart care, we have created designated Provider Care Teams.  These Care Teams include your primary Cardiologist (physician) and Advanced Practice Providers (APPs -  Physician Assistants and Nurse Practitioners) who all work together to provide you with the care you need, when you need it.  You will need a follow up appointment in 12 months  Providers on your designated Care Team:   Murray Hodgkins, NP Christell Faith, PA-C Cadence Kathlen Mody, [place]  COVID-19 Vaccine Information can be found at: [email or web address] For questions related to vaccine distribution or appointments, please email [email or web address] or call [phone].

## 2022-06-15 ENCOUNTER — Encounter: Payer: Self-pay | Admitting: Obstetrics and Gynecology

## 2022-06-15 ENCOUNTER — Ambulatory Visit (INDEPENDENT_AMBULATORY_CARE_PROVIDER_SITE_OTHER): Payer: Medicare Other | Admitting: Obstetrics and Gynecology

## 2022-06-15 DIAGNOSIS — Z01419 Encounter for gynecological examination (general) (routine) without abnormal findings: Secondary | ICD-10-CM | POA: Diagnosis not present

## 2022-06-15 NOTE — Progress Notes (Signed)
HPI:      [name] is a 82 y.o. [phone] who LMP was No LMP recorded. Patient has had a hysterectomy.  Subjective:   She presents today for her annual examination.  She denies problems.  She has previously had a hysterectomy for fibroids.  She has a mammogram scheduled in 2 days. Her mother was diagnosed with colon cancer at the age of 69 and so Samantha Woodard regularly goes to all of her routine checkups.    Hx: The following portions of the patient's history were reviewed and updated as appropriate:             She  has a past medical history of Anemia, Anginal pain ([place]), Anxiety and depression, Arthritis, degenerative, Atrial fibrillation ([place]), Atypical mole (10/26/2021), Cerebrovascular disease, Chronic kidney disease, Chronic low back pain, Complication of anesthesia (1970's), Diverticulosis, Dyslipidemia, Dysplastic nevus (10/26/2021), Fibromyalgia, GERD (gastroesophageal reflux disease), History of hiatal hernia, HTN (hypertension), IBS (irritable bowel syndrome), Mild mitral valve prolapse, Obesity, OSA (obstructive sleep apnea), Peripheral neuropathy, Peripheral vascular disease ([place]), Positional vertigo, Rosacea, Rosacea, Scoliosis, Spina bifida occulta, Stroke ([place]), and Varicose vein. She does not have any pertinent problems on file. She  has a past surgical history that includes Vaginal hysterectomy; gallbladder resection; Tonsillectomy; Minor hemorrhoidectomy; Endovenous ablation saphenous vein w/ laser (10/22/2007); Cholecystectomy; Total knee arthroplasty (Left, 11/28/2016); Cardiac catheterization (1990's); Joint replacement (Right, 2022); Colonoscopy; Esophagogastroduodenoscopy; and Polypectomy. Her family history includes Cancer in her father and mother; Carpal tunnel syndrome in her sister; Cerebrovascular Disease in her maternal grandmother; Diabetes in her sister; Heart attack in her father and mother; Heart disease in her father; Hyperlipidemia in her brother, sister, and  sister; Hypertension in her brother, father, mother, sister, and sister; Melanoma in her father and sister; Stroke in her paternal grandmother; Varicose Veins in her mother, sister, and sister. She  reports that she has never smoked. She has never used smokeless tobacco. She reports that she does not drink alcohol and does not use drugs. She has a current medication list which includes the following prescription(s): acetaminophen, aspirin ec, carisoprodol, celecoxib, cetirizine, clopidogrel, cyanocobalamin, gabapentin, hydrochlorothiazide, lisinopril, multivitamin with minerals, pantoprazole, align, simethicone, simvastatin, tetrahydrozoline-zinc, benefiber, and fluticasone. She is allergic to neomycin, silicone, tape, iodine, iodinated contrast media, and tizanidine.       Review of Systems:  Review of Systems  Constitutional: Denied constitutional symptoms, night sweats, recent illness, fatigue, fever, insomnia and weight loss.  Eyes: Denied eye symptoms, eye pain, photophobia, vision change and visual disturbance.  Ears/Nose/Throat/Neck: Denied ear, nose, throat or neck symptoms, hearing loss, nasal discharge, sinus congestion and sore throat.  Cardiovascular: Denied cardiovascular symptoms, arrhythmia, chest pain/pressure, edema, exercise intolerance, orthopnea and palpitations.  Respiratory: Denied pulmonary symptoms, asthma, pleuritic pain, productive sputum, cough, dyspnea and wheezing.  Gastrointestinal: Denied, gastro-esophageal reflux, melena, nausea and vomiting.  Genitourinary: Denied genitourinary symptoms including symptomatic vaginal discharge, pelvic relaxation issues, and urinary complaints.  Musculoskeletal: Denied musculoskeletal symptoms, stiffness, swelling, muscle weakness and myalgia.  Dermatologic: Denied dermatology symptoms, rash and scar.  Neurologic: Denied neurology symptoms, dizziness, headache, neck pain and syncope.  Psychiatric: Denied psychiatric symptoms, anxiety  and depression.  Endocrine: Denied endocrine symptoms including hot flashes and night sweats.   Meds:   Current Outpatient Medications on File Prior to Visit  Medication Sig Dispense Refill   acetaminophen (TYLENOL) 500 MG tablet Take 500 mg by mouth every 6 (six) hours as needed.      aspirin EC 81 MG tablet Take 81 mg  by mouth daily with supper.     carisoprodol (SOMA) 350 MG tablet Take 175-350 mg by mouth daily as needed for muscle spasms.     celecoxib (CELEBREX) 200 MG capsule Take 200 mg by mouth daily with lunch.      cetirizine (ZYRTEC) 10 MG tablet Take 10 mg by mouth at bedtime.     clopidogrel (PLAVIX) 75 MG tablet Take 0.5 tablets (37.5 mg total) by mouth daily. 90 tablet 0   cyanocobalamin (VITAMIN B12) 1000 MCG tablet Take 1,000 mcg by mouth daily.     gabapentin (NEURONTIN) 600 MG tablet Take 1 tablet (600 mg total) by mouth 3 (three) times daily. 270 tablet 0   hydrochlorothiazide (HYDRODIURIL) 12.5 MG tablet Take 12.5 mg by mouth daily with lunch.      lisinopril (ZESTRIL) 5 MG tablet Take 2.5 mg by mouth daily.     Multiple Vitamin (MULTIVITAMIN WITH MINERALS) TABS tablet Take 1 tablet by mouth daily with supper. WOMEN'S 50+     pantoprazole (PROTONIX) 40 MG tablet Take 40 mg by mouth 2 (two) times daily before a meal. 30 MINS TO 1 HOUR BEFORE EATING.     Probiotic Product (ALIGN) 4 MG CAPS Take 4 mg by mouth daily with lunch.     Simethicone 180 MG CAPS Take 180 mg by mouth 2 (two) times daily as needed (FOR BLOATING/GAS). phazyme     simvastatin (ZOCOR) 20 MG tablet Take 20 mg by mouth at bedtime.     tetrahydrozoline-zinc (VISINE-AC) 0.05-0.25 % ophthalmic solution Place 2 drops into both eyes 3 (three) times daily as needed (for redness/itchy eyes.).     Wheat Dextrin (BENEFIBER) POWD Take 22.5 mLs by mouth 2 (two) times daily.     fluticasone (FLONASE) 50 MCG/ACT nasal spray Place 1 spray into the nose at bedtime.     No current facility-administered medications on  file prior to visit.     Objective:     There were no vitals filed for this visit.  There were no vitals filed for this visit.            Physical examination General NAD, Conversant  HEENT Atraumatic; Op clear with mmm.  Normo-cephalic. Pupils reactive. Anicteric sclerae  Thyroid/Neck Smooth without nodularity or enlargement. Normal ROM.  Neck Supple.  Skin No rashes, lesions or ulceration. Normal palpated skin turgor. No nodularity.  Breasts: No masses or discharge.  Symmetric.  No axillary adenopathy.  Lungs: Clear to auscultation.No rales or wheezes. Normal Respiratory effort, no retractions.  Heart: NSR.  No murmurs or rubs appreciated. No periferal edema  Abdomen: Soft.  Non-tender.  No masses.  No HSM. No hernia  Extremities: Moves all appropriately.  Normal ROM for age. No lymphadenopathy.  Neuro: Oriented to PPT.  Normal mood. Normal affect.     Pelvic:   Vulva: Normal appearance.  No lesions.   Vagina: No lesions or abnormalities noted.  Support: Normal pelvic support.  Urethra No masses tenderness or scarring.  Meatus Normal size without lesions or prolapse.  Cervix: Surgically absent   Anus: Normal exam.  No lesions.  Perineum: Normal exam.  No lesions.        Bimanual   Uterus: Surgically absent   Adnexae: No masses.  Non-tender to palpation.  Cul-de-sac: Negative for abnormality.     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  patient 04/05/2021   Allergic rhinitis 10/28/2020   Anxiety 10/28/2020   Decreased estrogen level 10/28/2020   Edema 10/28/2020   Family history of malignant neoplasm of gastrointestinal tract 10/28/2020   Malignant hypertensive chronic kidney disease 10/28/2020   Peripheral neuropathy 10/28/2020   Personal history of transient ischemic attack (TIA), and cerebral infarction without residual deficits 10/28/2020    Major depression 10/28/2020   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[place]) 10/28/2020   Sleep apnea 10/28/2020   Chest pain with moderate risk for cardiac etiology 07/24/2018   Morbid obesity ([place]) 07/24/2018   Mixed hyperlipidemia 07/24/2018   Stage 2 chronic kidney disease 07/24/2018   Gastroesophageal reflux disease without esophagitis 07/24/2018   Hiatal hernia 07/24/2018   Primary osteoarthritis of left knee 11/27/2016   Hx of colonic polyps 03/27/2014   Varicose veins of lower extremities with other complications 42/70/6237   Platelet inhibition due to Plavix 12/05/2013   Fecal incontinence 11/26/2013   Internal prolapsed hemorrhoids 11/26/2013   TIA (transient ischemic attack) 10/14/2013   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 Well woman exam with routine gynecological exam     Normal exam   Plan:            1.  Basic Screening Recommendations The basic screening recommendations for asymptomatic women were discussed with the patient during her visit.  The age-appropriate recommendations were discussed with her and the rational for the tests reviewed.  When I am informed by the patient that another primary care physician has previously obtained the age-appropriate tests and they are up-to-date, only outstanding tests are ordered and referrals given as necessary.  Abnormal results of tests will be discussed with her when all of her results are completed.  Routine preventative health maintenance measures emphasized: Exercise/Diet/Weight control, Tobacco Warnings, Alcohol/Substance use risks and Stress Management  Orders No orders of the defined types were placed in this encounter.   No orders of the defined types were placed in this encounter.         F/U  Return in about 1 year (around 06/16/2023) for Annual Physical.  Finis Bud, M.D. 06/15/2022 2:05 PM

## 2022-06-15 NOTE — Progress Notes (Signed)
Patients presents for annual exam today. She states doing well. History of hysterectomy in 1974 due to fibroids and prolapse.  Patient is due for mammogram, scheduled for 06/17/22. Patients annual labs are declined, preformed with PCP. She states no other questions or concerns at this time.

## 2022-06-17 ENCOUNTER — Ambulatory Visit
Admission: RE | Admit: 2022-06-17 | Discharge: 2022-06-17 | Disposition: A | Discharge status: Home / Self Care | Payer: Medicare Other | Source: Ambulatory Visit | Attending: Internal Medicine | Admitting: Internal Medicine

## 2022-06-17 DIAGNOSIS — Z1231 Encounter for screening mammogram for malignant neoplasm of breast: Secondary | ICD-10-CM | POA: Insufficient documentation

## 2022-07-07 ENCOUNTER — Ambulatory Visit: Payer: Medicare Other | Admitting: Podiatry

## 2022-07-18 ENCOUNTER — Ambulatory Visit: Payer: Medicare Other | Admitting: Podiatry

## 2022-07-19 ENCOUNTER — Ambulatory Visit: Payer: Medicare Other | Attending: Cardiovascular Disease

## 2022-07-19 DIAGNOSIS — R0602 Shortness of breath: Secondary | ICD-10-CM

## 2022-07-19 LAB — ECHOCARDIOGRAM COMPLETE
AR max vel: 2.53 cm2
AV Area VTI: 2.41 cm2
AV Area mean vel: 2.52 cm2
AV Mean grad: 6 mmHg
AV Peak grad: 13.1 mmHg
Ao pk vel: 1.81 m/s
Area-P 1/2: 3.16 cm2
Calc EF: 58.1 %
S' Lateral: 2.6 cm
Single Plane A2C EF: 56.1 %
Single Plane A4C EF: 61.3 %

## 2022-07-28 ENCOUNTER — Ambulatory Visit: Payer: Medicare Other | Admitting: Podiatry

## 2022-07-28 ENCOUNTER — Encounter: Payer: Self-pay | Admitting: Podiatry

## 2022-07-28 VITALS — BP 176/78 | HR 70

## 2022-07-28 DIAGNOSIS — B351 Tinea unguium: Secondary | ICD-10-CM

## 2022-07-28 DIAGNOSIS — G63 Polyneuropathy in diseases classified elsewhere: Secondary | ICD-10-CM | POA: Diagnosis not present

## 2022-07-28 DIAGNOSIS — M79676 Pain in unspecified toe(s): Secondary | ICD-10-CM | POA: Diagnosis not present

## 2022-07-28 NOTE — Progress Notes (Signed)
This patient returns to my office for at risk foot care.  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  This patient is unable to cut nails herself since the patient cannot reach her nails.These nails are painful walking and wearing shoes.  This patient presents for at risk foot care today.  General Appearance  Alert, conversant and in no acute stress.  Vascular  Dorsalis pedis and posterior tibial  pulses are palpable  bilaterally.  Capillary return is within normal limits  bilaterally. Temperature is within normal limits  bilaterally.  Neurologic  Senn-Weinstein monofilament wire test within normal limits  bilaterally. Muscle power within normal limits bilaterally.  Nails Thick disfigured discolored nails with subungual debris  from second  to fifth toes bilaterally. No evidence of bacterial infection or drainage bilaterally.  Orthopedic  No limitations of motion  feet .  No crepitus or effusions noted.  No bony pathology or digital deformities noted.  Skin  normotropic skin with no porokeratosis noted bilaterally.  No signs of infections or ulcers noted.     Onychomycosis  Pain in right toes  Pain in left toes  Consent was obtained for treatment procedures.   Mechanical debridement of nails 2-5  bilaterally performed with a nail nipper.  Filed with dremel without incident.    Return office visit   10 weeks                   Told patient to return for periodic foot care and evaluation due to potential at risk complications.   Gardiner Barefoot DPM

## 2022-10-06 ENCOUNTER — Ambulatory Visit: Payer: Medicare Other | Admitting: Podiatry

## 2022-10-24 ENCOUNTER — Encounter: Payer: Self-pay | Admitting: Podiatry

## 2022-10-24 ENCOUNTER — Ambulatory Visit: Payer: Medicare Other | Admitting: Podiatry

## 2022-10-24 DIAGNOSIS — G63 Polyneuropathy in diseases classified elsewhere: Secondary | ICD-10-CM

## 2022-10-24 DIAGNOSIS — M79676 Pain in unspecified toe(s): Secondary | ICD-10-CM | POA: Diagnosis not present

## 2022-10-24 DIAGNOSIS — B351 Tinea unguium: Secondary | ICD-10-CM | POA: Diagnosis not present

## 2022-10-28 NOTE — Progress Notes (Signed)
  Subjective:  Patient ID: Samantha Woodard, female    DOB: [birth year],  MRN: [id number]  Areanna Stonier presents to clinic today for:  Chief Complaint  Patient presents with   Nail Problem    RFC,Referring Provider Enid Baas, MD,LOV:04/24     .  PCP is Enid Baas, MD.  Allergies  Allergen Reactions   Neomycin Hives, Rash and Itching   Silicone Hives, Itching, Other (See Comments) and Rash   Tape Hives, Rash and Itching   Iodine Hives    IV CONTRAST   Iodinated Contrast Media Hives   Tizanidine Other (See Comments)    To Strong    Review of Systems: Negative except as noted in the HPI.  Objective: No changes noted in today's physical examination. There were no vitals filed for this visit.  Samantha Woodard is a pleasant 82 y.o. female in NAD. AAO x 3.  Vascular Examination: Capillary refill time <3 seconds b/l LE. Palpable pedal pulses b/l LE. Digital hair present b/l. No pedal edema b/l. Skin temperature gradient WNL b/l. No varicosities b/l. Marland Kitchen  Dermatological Examination: Pedal skin with normal turgor, texture and tone b/l. No open wounds. No interdigital macerations b/l. Toenails 2-5 b/l thickened, discolored, dystrophic with subungual debris. There is pain on palpation to dorsal aspect of nailplates. Anonychia noted bilateral great toes. Nailbed(s) epithelialized. .  Neurological Examination: Pt has subjective symptoms of neuropathy. Protective sensation intact with 10 gram monofilament b/l LE. Vibratory sensation intact b/l LE.   Musculoskeletal Examination: Muscle strength 5/5 to all LE muscle groups b/l. No pain, crepitus or joint limitation noted with ROM bilateral LE. No gross bony deformities bilaterally.  Assessment/Plan: 1. 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 All patient's and/or POA's questions/concerns answered on today's  visit. -Patient to continue soft, supportive shoe gear daily. -Toenails were debrided in length and girth 2-5 bilaterally with sterile nail nippers and dremel without iatrogenic bleeding.  -Patient/POA to call should there be question/concern in the interim.   Return in about 10 weeks (around 01/02/2023).  Freddie Breech, DPM

## 2022-11-08 ENCOUNTER — Ambulatory Visit: Payer: Medicare Other | Admitting: Dermatology

## 2022-11-08 DIAGNOSIS — D2271 Melanocytic nevi of right lower limb, including hip: Secondary | ICD-10-CM

## 2022-11-08 DIAGNOSIS — D229 Melanocytic nevi, unspecified: Secondary | ICD-10-CM

## 2022-11-08 DIAGNOSIS — L708 Other acne: Secondary | ICD-10-CM

## 2022-11-08 DIAGNOSIS — L820 Inflamed seborrheic keratosis: Secondary | ICD-10-CM | POA: Diagnosis not present

## 2022-11-08 DIAGNOSIS — Z1283 Encounter for screening for malignant neoplasm of skin: Secondary | ICD-10-CM

## 2022-11-08 DIAGNOSIS — L729 Follicular cyst of the skin and subcutaneous tissue, unspecified: Secondary | ICD-10-CM

## 2022-11-08 DIAGNOSIS — D2239 Melanocytic nevi of other parts of face: Secondary | ICD-10-CM

## 2022-11-08 DIAGNOSIS — L578 Other skin changes due to chronic exposure to nonionizing radiation: Secondary | ICD-10-CM

## 2022-11-08 DIAGNOSIS — L821 Other seborrheic keratosis: Secondary | ICD-10-CM | POA: Diagnosis not present

## 2022-11-08 DIAGNOSIS — X32XXXA Exposure to sunlight, initial encounter: Secondary | ICD-10-CM

## 2022-11-08 DIAGNOSIS — L814 Other melanin hyperpigmentation: Secondary | ICD-10-CM

## 2022-11-08 DIAGNOSIS — W908XXA Exposure to other nonionizing radiation, initial encounter: Secondary | ICD-10-CM

## 2022-11-08 DIAGNOSIS — Z86018 Personal history of other benign neoplasm: Secondary | ICD-10-CM

## 2022-11-08 DIAGNOSIS — I8393 Asymptomatic varicose veins of bilateral lower extremities: Secondary | ICD-10-CM

## 2022-11-08 NOTE — Progress Notes (Signed)
Follow-Up Visit   Subjective  Samantha Woodard is a 82 y.o. female who presents for the following: Skin Cancer Screening and Full Body Skin Exam, hx of Dysplastic nevus. Pt has some spots around braline that get irritated.  The patient presents for Total-Body Skin Exam (TBSE) for skin cancer screening and mole check. The patient has spots, moles and lesions to be evaluated, some may be new or changing and the patient has concerns that these could be cancer.    The following portions of the chart were reviewed this encounter and updated as appropriate: medications, allergies, medical history  Review of Systems:  No other skin or systemic complaints except as noted in HPI or Assessment and Plan.  Objective  Well appearing patient in no apparent distress; mood and affect are within normal limits.  A full examination was performed including scalp, head, eyes, ears, nose, lips, neck, chest, axillae, abdomen, back, buttocks, bilateral upper extremities, bilateral lower extremities, hands, feet, fingers, toes, fingernails, and toenails. All findings within normal limits unless otherwise noted below.   Relevant physical exam findings are noted in the Assessment and Plan.  right lateral breast x 1, left flank at braline x 2, left lateral breast x 1 (4) Stuck-on, waxy, tan-brown papules--Discussed benign etiology and prognosis.     Assessment & Plan   LENTIGINES, SEBORRHEIC KERATOSES, HEMANGIOMAS - Benign normal skin lesions - Benign-appearing - Call for any changes  MELANOCYTIC NEVI - Tan-brown and/or pink-flesh-colored symmetric macules and papules - Benign appearing on exam today - Observation - Call clinic for new or changing moles - Recommend daily use of broad spectrum spf 30+ sunscreen to sun-exposed areas.  Nevus left malar cheek 0.5 x 0.4 cm light tan papule   Benign-appearing.  Observation.  Call clinic for new or changing lesions.  Recommend daily use of broad spectrum spf  30+ sunscreen to sun-exposed areas.   ACTINIC DAMAGE - Chronic condition, secondary to cumulative UV/sun exposure - diffuse scaly erythematous macules with underlying dyspigmentation - Recommend daily broad spectrum sunscreen SPF 30+ to sun-exposed areas, reapply every 2 hours as needed.  - Staying in the shade or wearing long sleeves, sun glasses (UVA+UVB protection) and wide brim hats (4-inch brim around the entire circumference of the hat) are also recommended for sun protection.  - Call for new or changing lesions.  Dilated pore of Winer  Mid back- dilated pore   Benign, observe.     CYST  Mid sternum   Benign-appearing. Exam most consistent with an epidermal inclusion cyst. Discussed that a cyst is a benign growth that can grow over time and sometimes get irritated or inflamed. Recommend observation if it is not bothersome. Discussed option of surgical excision to remove it if it is growing, symptomatic, or other changes noted. Please call for new or changing lesions so they can be evaluated.    Inflamed seborrheic keratosis (4) right lateral breast x 1, left flank at braline x 2, left lateral breast x 1  Symptomatic, irritating, patient would like treated.   Destruction of lesion - right lateral breast x 1, left flank at braline x 2, left lateral breast x 1  Destruction method: cryotherapy   Informed consent: discussed and consent obtained   Timeout:  patient name, date of birth, surgical site, and procedure verified Lesion destroyed using liquid nitrogen: Yes   Region frozen until ice ball extended beyond lesion: Yes   Outcome: patient tolerated procedure well with no complications   Post-procedure details: wound  care instructions given    Skin cancer screening  Actinic skin damage  History of dysplastic nevus  Varicose Veins/Spider Veins - Dilated blue, purple or red veins at the lower extremities - Reassured - Smaller vessels can be treated by sclerotherapy (a  procedure to inject a medicine into the veins to make them disappear) if desired, but the treatment is not covered by insurance. Larger vessels may be covered if symptomatic and we would refer to vascular surgeon if treatment desired.  Atypical intraepidermal melanocytic proliferation, margins free. Excised 11/22/2021  right medial posterior ankle Well healed scar   Treatment Plan: Benign-appearing.  Observation.  Call clinic for new or changing lesions.  Recommend daily use of broad spectrum spf 30+ sunscreen to sun-exposed areas.     HISTORY OF DYSPLASTIC NEVUS Spinal mid upper back - no residual dysplastic nevus 12/2021  No evidence of recurrence today Recommend regular full body skin exams Recommend daily broad spectrum sunscreen SPF 30+ to sun-exposed areas, reapply every 2 hours as needed.  Call if any new or changing lesions are noted between office visits    SKIN CANCER SCREENING PERFORMED TODAY.  Return in about 1 year (around 11/08/2023) for TBSE, hx of Dysplastic nevus .  I, Angelique Holm, CMA, am acting as scribe for Willeen Niece, MD .   Documentation: I have reviewed the above documentation for accuracy and completeness, and I agree with the above.  Willeen Niece, MD

## 2022-11-08 NOTE — Patient Instructions (Addendum)
Cryotherapy Aftercare  Wash gently with soap and water everyday.   Apply Vaseline and Band-Aid daily until healed.     Due to recent changes in healthcare laws, you may see results of your pathology and/or laboratory studies on MyChart before the doctors have had a chance to review them. We understand that in some cases there may be results that are confusing or concerning to you. Please understand that not all results are received at the same time and often the doctors may need to interpret multiple results in order to provide you with the best plan of care or course of treatment. Therefore, we ask that you please give us 2 business days to thoroughly review all your results before contacting the office for clarification. Should we see a critical lab result, you will be contacted sooner.   If You Need Anything After Your Visit  If you have any questions or concerns for your doctor, please call our main line at [phone] and press option 4 to reach your doctor's medical assistant. If no one answers, please leave a voicemail as directed and we will return your call as soon as possible. Messages left after 4 pm will be answered the following business day.   You may also send us a message via MyChart. We typically respond to MyChart messages within 1-2 business days.  For prescription refills, please ask your pharmacy to contact our office. Our fax number is [phone].  If you have an urgent issue when the clinic is closed that cannot wait until the next business day, you can page your doctor at the number below.    Please note that while we do our best to be available for urgent issues outside of office hours, we are not available 24/7.   If you have an urgent issue and are unable to reach us, you may choose to seek medical care at your doctor's office, retail clinic, urgent care center, or emergency room.  If you have a medical emergency, please immediately call 911 or go to the  emergency department.  Pager Numbers  - Dr. Kowalski: [phone]  - Dr. Moye: [phone]  - Dr. Stewart: [phone]  In the event of inclement weather, please call our main line at [phone] for an update on the status of any delays or closures.  Dermatology Medication Tips: Please keep the boxes that topical medications come in in order to help keep track of the instructions about where and how to use these. Pharmacies typically print the medication instructions only on the boxes and not directly on the medication tubes.   If your medication is too expensive, please contact our office at [phone] option 4 or send us a message through MyChart.   We are unable to tell what your co-pay for medications will be in advance as this is different depending on your insurance coverage. However, we may be able to find a substitute medication at lower cost or fill out paperwork to get insurance to cover a needed medication.   If a prior authorization is required to get your medication covered by your insurance company, please allow us 1-2 business days to complete this process.  Drug prices often vary depending on where the prescription is filled and some pharmacies may offer cheaper prices.  The website www.goodrx.com contains coupons for medications through different pharmacies. The prices here do not account for what the cost may be with help from insurance (it may be cheaper with your insurance), but the website can   give you the price if you did not use any insurance.  - You can print the associated coupon and take it with your prescription to the pharmacy.  - You may also stop by our office during regular business hours and pick up a GoodRx coupon card.  - If you need your prescription sent electronically to a different pharmacy, notify our office through [facility] MyChart or by phone at [phone] option 4.     Si Usted Necesita Algo Despus de Su Visita  Tambin puede  enviarnos un mensaje a travs de MyChart. Por lo general respondemos a los mensajes de MyChart en el transcurso de 1 a 2 das hbiles.  Para renovar recetas, por favor pida a su farmacia que se ponga en contacto con nuestra oficina. Nuestro nmero de fax es el [phone].  Si tiene un asunto urgente cuando la clnica est cerrada y que no puede esperar hasta el siguiente da hbil, puede llamar/localizar a su doctor(a) al nmero que aparece a continuacin.   Por favor, tenga en cuenta que aunque hacemos todo lo posible para estar disponibles para asuntos urgentes fuera del horario de oficina, no estamos disponibles las 24 horas del da, los 7 das de la semana.   Si tiene un problema urgente y no puede comunicarse con nosotros, puede optar por buscar atencin mdica  en el consultorio de su doctor(a), en una clnica privada, en un centro de atencin urgente o en una sala de emergencias.  Si tiene una emergencia mdica, por favor llame inmediatamente al 911 o vaya a la sala de emergencias.  Nmeros de bper  - Dr. Kowalski: [phone]  - Dra. Moye: [phone]  - Dra. Stewart: [phone]  En caso de inclemencias del tiempo, por favor llame a nuestra lnea principal al [phone] para una actualizacin sobre el estado de cualquier retraso o cierre.  Consejos para la medicacin en dermatologa: Por favor, guarde las cajas en las que vienen los medicamentos de uso tpico para ayudarle a seguir las instrucciones sobre dnde y cmo usarlos. Las farmacias generalmente imprimen las instrucciones del medicamento slo en las cajas y no directamente en los tubos del medicamento.   Si su medicamento es muy caro, por favor, pngase en contacto con nuestra oficina llamando al [phone] y presione la opcin 4 o envenos un mensaje a travs de MyChart.   No podemos decirle cul ser su copago por los medicamentos por adelantado ya que esto es diferente dependiendo de la cobertura de su seguro.  Sin embargo, es posible que podamos encontrar un medicamento sustituto a menor costo o llenar un formulario para que el seguro cubra el medicamento que se considera necesario.   Si se requiere una autorizacin previa para que su compaa de seguros cubra su medicamento, por favor permtanos de 1 a 2 das hbiles para completar este proceso.  Los precios de los medicamentos varan con frecuencia dependiendo del lugar de dnde se surte la receta y alguna farmacias pueden ofrecer precios ms baratos.  El sitio web www.goodrx.com tiene cupones para medicamentos de diferentes farmacias. Los precios aqu no tienen en cuenta lo que podra costar con la ayuda del seguro (puede ser ms barato con su seguro), pero el sitio web puede darle el precio si no utiliz ningn seguro.  - Puede imprimir el cupn correspondiente y llevarlo con su receta a la farmacia.  - Tambin puede pasar por nuestra oficina durante el horario de atencin regular y recoger una tarjeta de cupones de GoodRx.  -   Si necesita que su receta se enve electrnicamente a una farmacia diferente, informe a nuestra oficina a travs de MyChart de [facility] o por telfono llamando al [phone] y presione la opcin 4.  

## 2022-12-20 ENCOUNTER — Other Ambulatory Visit: Payer: Self-pay | Admitting: Gastroenterology

## 2022-12-20 DIAGNOSIS — K219 Gastro-esophageal reflux disease without esophagitis: Secondary | ICD-10-CM

## 2022-12-26 ENCOUNTER — Encounter: Payer: Self-pay | Admitting: Podiatry

## 2022-12-26 ENCOUNTER — Ambulatory Visit: Payer: Medicare Other | Admitting: Podiatry

## 2022-12-26 VITALS — BP 150/67

## 2022-12-26 DIAGNOSIS — G63 Polyneuropathy in diseases classified elsewhere: Secondary | ICD-10-CM | POA: Diagnosis not present

## 2022-12-26 DIAGNOSIS — D689 Coagulation defect, unspecified: Secondary | ICD-10-CM

## 2022-12-26 DIAGNOSIS — M79676 Pain in unspecified toe(s): Secondary | ICD-10-CM | POA: Diagnosis not present

## 2022-12-26 DIAGNOSIS — B351 Tinea unguium: Secondary | ICD-10-CM | POA: Diagnosis not present

## 2022-12-27 ENCOUNTER — Ambulatory Visit
Admission: RE | Admit: 2022-12-27 | Discharge: 2022-12-27 | Disposition: A | Discharge status: Home / Self Care | Payer: Medicare Other | Source: Ambulatory Visit | Attending: Gastroenterology | Admitting: Gastroenterology

## 2022-12-27 DIAGNOSIS — K219 Gastro-esophageal reflux disease without esophagitis: Secondary | ICD-10-CM | POA: Diagnosis not present

## 2022-12-31 NOTE — Progress Notes (Signed)
  Subjective:  Patient ID: Samantha Woodard, female    DOB: Jan 06, 1941,  MRN: [id number]  Samantha Woodard presents to clinic today for: at risk foot care with history of coagulation defect and peripheral neuropathy for painful thick toenails that are difficult to trim. Pain interferes with ambulation. Aggravating factors include wearing enclosed shoe gear. Pain is relieved with periodic professional debridement.  Chief Complaint  Patient presents with   Nail Problem    Rfc,Referring Provider Enid Baas, MD,lov:04/24      PCP is Enid Baas, MD.  Allergies  Allergen Reactions   Neomycin Hives, Rash and Itching   Silicone Hives, Itching, Other (See Comments) and Rash   Tape Hives, Rash and Itching   Iodine Hives    IV CONTRAST   Iodinated Contrast Media Hives   Tizanidine Other (See Comments)    To Strong    Review of Systems: Negative except as noted in the HPI.  Objective: No changes noted in today's physical examination. Vitals:   12/26/22 1605  BP: (!) 150/67   Samantha Woodard is a pleasant 82 y.o. female in NAD. AAO x 3.  Vascular Examination: Capillary refill time <3 seconds b/l LE. Palpable pedal pulses b/l LE. Digital hair present b/l. No pedal edema b/l. Skin temperature gradient WNL b/l. No varicosities b/l. No ischemia or gangrene noted b/l LE. No cyanosis or clubbing noted b/l LE.Marland Kitchen  Dermatological Examination: Pedal skin with normal turgor, texture and tone b/l. No open wounds. No interdigital macerations b/l. Toenails 2-5 b/l thickened, discolored, dystrophic with subungual debris. There is pain on palpation to dorsal aspect of nailplates. Anonychia noted bilateral great toes. Nailbed(s) epithelialized. .  Neurological Examination: Protective sensation intact with 10 gram monofilament b/l LE. Vibratory sensation intact b/l LE. Pt has subjective symptoms of neuropathy.  Musculoskeletal Examination: Muscle strength 5/5 to all lower extremity muscle  groups bilaterally. No pain, crepitus or joint limitation noted with ROM bilateral LE. No gross bony deformities bilaterally.  Assessment/Plan: 1. 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 Coagulation disorder (HCC)   3. 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 -Patient to continue soft, supportive shoe gear daily. -Mycotic toenails 2-5 bilaterally were debrided in length and girth with sterile nail nippers and dremel without iatrogenic bleeding. -Patient/POA to call should there be question/concern in the interim.   Return in about 9 weeks (around 02/27/2023).  Samantha Woodard, DPM

## 2023-03-16 ENCOUNTER — Encounter: Payer: Self-pay | Admitting: Podiatry

## 2023-03-16 ENCOUNTER — Ambulatory Visit: Payer: Medicare Other | Admitting: Podiatry

## 2023-03-16 DIAGNOSIS — G63 Polyneuropathy in diseases classified elsewhere: Secondary | ICD-10-CM

## 2023-03-16 DIAGNOSIS — M79676 Pain in unspecified toe(s): Secondary | ICD-10-CM

## 2023-03-16 DIAGNOSIS — D689 Coagulation defect, unspecified: Secondary | ICD-10-CM | POA: Diagnosis not present

## 2023-03-16 DIAGNOSIS — B351 Tinea unguium: Secondary | ICD-10-CM | POA: Diagnosis not present

## 2023-03-22 NOTE — Progress Notes (Signed)
Subjective:  Patient ID: Samantha Woodard, female    DOB: 1941-01-01,  MRN: [id number]  82 y.o. female presents at risk foot care. Patient has history of neuropathy and coagulation defect and painful elongated mycotic toenails 1-5 bilaterally which are tender when wearing enclosed shoe gear. Pain is relieved with periodic professional debridement.  Chief Complaint  Patient presents with   RFC    New problem(s): None   PCP is Enid Baas, MD , and last visit was September 22, 2022.  Allergies  Allergen Reactions   Neomycin Hives, Rash and Itching   Silicone Hives, Itching, Other (See Comments) and Rash   Tape Hives, Rash and Itching   Iodine Hives    IV CONTRAST   Iodinated Contrast Media Hives   Tizanidine Other (See Comments)    To Strong    Review of Systems: Negative except as noted in the HPI.   Objective:  Samantha Woodard is a pleasant 82 y.o. female in NAD. AAO x 3.  Vascular Examination: Vascular status intact b/l with palpable pedal pulses. CFT immediate b/l. Pedal hair present. No edema. No pain with calf compression b/l. Skin temperature gradient WNL b/l. No varicosities noted. No cyanosis or clubbing noted.  Neurological Examination: Sensation grossly intact b/l with 10 gram monofilament. Vibratory sensation intact b/l.  Dermatological Examination: Pedal skin with normal turgor, texture and tone b/l. No open wounds nor interdigital macerations noted. Toenails 2-5 b/l thick, discolored, elongated with subungual debris and pain on dorsal palpation. No hyperkeratotic lesions noted b/l.   Anonychia noted bilateral great toes. Nailbed(s) epithelialized.   Musculoskeletal Examination: Muscle strength 5/5 to b/l LE.  No pain, crepitus noted b/l. No gross pedal deformities. Patient ambulates independently without assistive aids.   Radiographs: None  Last A1c:       No data to display           Assessment:   1. 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  Coagulation disorder (HCC)   3. 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 All patient's and/or POA's questions/concerns addressed on today's visit. Toenails 2-5 bilaterally debrided in length and girth without incident. Continue soft, supportive shoe gear daily. Report any pedal injuries to medical professional. Call office if there are any questions/concerns. -Patient/POA to call should there be question/concern in the interim.  Return in about 9 weeks (around 05/18/2023).  Freddie Breech, DPM

## 2023-05-18 ENCOUNTER — Encounter: Payer: Self-pay | Admitting: Podiatry

## 2023-05-18 ENCOUNTER — Ambulatory Visit: Payer: Medicare Other | Admitting: Podiatry

## 2023-05-18 DIAGNOSIS — D689 Coagulation defect, unspecified: Secondary | ICD-10-CM

## 2023-05-18 DIAGNOSIS — M79676 Pain in unspecified toe(s): Secondary | ICD-10-CM

## 2023-05-18 DIAGNOSIS — B351 Tinea unguium: Secondary | ICD-10-CM | POA: Diagnosis not present

## 2023-05-18 DIAGNOSIS — G63 Polyneuropathy in diseases classified elsewhere: Secondary | ICD-10-CM

## 2023-05-26 NOTE — Progress Notes (Signed)
  Subjective:  Patient ID: Samantha Woodard, female    DOB: [birth year],  MRN: [id number]  82 y.o. female presents to clinic with  at risk foot care. Patient has history of coagulation defect and neuropathy and painful thick toenails that are difficult to trim. Pain interferes with ambulation. Aggravating factors include wearing enclosed shoe gear. Pain is relieved with periodic professional debridement.    New problem(s): None   PCP is Enid Baas, MD.  Allergies  Allergen Reactions   Neomycin Hives, Rash and Itching   Silicone Hives, Itching, Other (See Comments) and Rash   Tape Hives, Rash and Itching   Iodine Hives    IV CONTRAST   Iodinated Contrast Media Hives   Tizanidine Other (See Comments)    To Strong    Review of Systems: Negative except as noted in the HPI.   Objective:  Samantha Woodard is a pleasant 82 y.o. female in NAD.Marland Kitchen AAO x 3.  Vascular Examination: Vascular status intact b/l with palpable pedal pulses. CFT immediate b/l. No edema. No pain with calf compression b/l. Skin temperature gradient WNL b/l.   Neurological Examination: Sensation grossly intact b/l with 10 gram monofilament. Vibratory sensation intact b/l.   Dermatological Examination: Pedal skin with normal turgor, texture and tone b/l. Toenails 2-5 b/l thick, discolored, elongated with subungual debris and pain on dorsal palpation. No hyperkeratotic lesions noted b/l. Anonychia noted L hallux and R hallux. Nailbed(s) epithelialized.   Musculoskeletal Examination: Muscle strength 5/5 to b/l LE. No gross bony deformities bilaterally.  Radiographs: None  Last A1c:       No data to display           Assessment:   1. 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 Coagulation disorder (HCC)   3. 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 -Continue supportive shoe gear daily. -Mycotic toenails  2-5 bilaterally were debrided in length and girth with sterile nail nippers and dremel without iatrogenic bleeding. -Patient/POA to call should there be question/concern in the interim.  Return in about 9 weeks (around 07/20/2023).  Freddie Breech, DPM      [facility] LOCATION: [address] [place] [address]                   Office [phone]   [facility] LOCATION: [address] [place], [place] [address] Office [phone]

## 2023-06-22 ENCOUNTER — Ambulatory Visit: Payer: Medicare Other | Admitting: Physical Therapy

## 2023-06-29 ENCOUNTER — Ambulatory Visit: Payer: Medicare Other | Admitting: Physical Therapy

## 2023-07-06 ENCOUNTER — Ambulatory Visit: Payer: Medicare Other | Attending: Internal Medicine | Admitting: Physical Therapy

## 2023-07-06 ENCOUNTER — Encounter: Payer: Self-pay | Admitting: Physical Therapy

## 2023-07-06 DIAGNOSIS — M5459 Other low back pain: Secondary | ICD-10-CM | POA: Diagnosis present

## 2023-07-06 DIAGNOSIS — R2689 Other abnormalities of gait and mobility: Secondary | ICD-10-CM | POA: Insufficient documentation

## 2023-07-06 DIAGNOSIS — M533 Sacrococcygeal disorders, not elsewhere classified: Secondary | ICD-10-CM | POA: Diagnosis present

## 2023-07-06 DIAGNOSIS — M4125 Other idiopathic scoliosis, thoracolumbar region: Secondary | ICD-10-CM | POA: Insufficient documentation

## 2023-07-06 NOTE — Patient Instructions (Signed)
?  Proper body mechanics with getting out of a chair to decrease strain  ?on back &pelvic floor  ? ?Avoid holding your breath when ?Getting out of the chair: ? ?Scoot to front part of chair chair ?Heels behind knees, feet are hip width apart, nose over toes  ?Inhale like you are smelling roses ?Exhale to stand  ? ? ?

## 2023-07-06 NOTE — Therapy (Signed)
OUTPATIENT PHYSICAL THERAPY EVALUATION   Patient Name: Samantha Woodard MRN: [id number] DOB:1941-04-21, 83 y.o., female Today's Date: 07/06/2023   PT End of Session - 07/06/23 1429     Visit Number 1    Number of Visits 10    Date for PT Re-Evaluation 09/14/23    PT Start Time 1420    PT Stop Time 1500    PT Time Calculation (min) 40 min             Past Medical History:  Diagnosis Date   Anemia    Anginal pain (HCC)    Anxiety and depression    Arthritis, degenerative    scoliosis   Atrial fibrillation (HCC)    history of- pt. reports thats why she is on Plavix. But the afib is resolved.     Atypical mole 10/26/2021   right medial posterior ankle, Atypical melanocytic proliferation, exc 11/22/21   Cerebrovascular disease    Chronic kidney disease    Chronic low back pain    Complication of anesthesia 1970's   post op- vertigo, odd sensation with " feeling like I was going to fall", also reports that the anesth. team remarked that she had a quick" response to anesth.     Diverticulosis    Dyslipidemia    Dysplastic nevus 10/26/2021   spinal mid upper back, moderate to severe, shave removal 12/08/21   Fibromyalgia    GERD (gastroesophageal reflux disease)    History of hiatal hernia    HTN (hypertension)    IBS (irritable bowel syndrome)    Mild mitral valve prolapse    Obesity    OSA (obstructive sleep apnea)    tried CPAP, then Bipap, now on concentrator   Peripheral neuropathy    feet   Peripheral vascular disease (HCC)    Positional vertigo    Rosacea    Rosacea    Scoliosis    lumbar 5   Spina bifida occulta    Stroke (HCC)    pt. reports that she had TIA's in the past & has been on Plavix ever since.    Varicose vein    Past Surgical History:  Procedure Laterality Date   CARDIAC CATHETERIZATION  1990's   no blockage    CHOLECYSTECTOMY     COLONOSCOPY     ENDOVENOUS ABLATION SAPHENOUS VEIN W/ LASER  10/22/2007   ESOPHAGOGASTRODUODENOSCOPY      gallbladder resection     JOINT REPLACEMENT Right 2022   right knee   MINOR HEMORRHOIDECTOMY     POLYPECTOMY     TONSILLECTOMY     TOTAL KNEE ARTHROPLASTY Left 11/28/2016   Procedure: TOTAL KNEE ARTHROPLASTY;  Surgeon: Gean Birchwood, MD;  Location: [facility] OR;  Service: Orthopedics;  Laterality: Left;   VAGINAL HYSTERECTOMY     Patient Active Problem List   Diagnosis Date Noted   Borderline diabetic 02/04/2022   Fibromyalgia 04/05/2021   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 04/05/2021   Allergic rhinitis 10/28/2020   Anxiety 10/28/2020   Decreased estrogen level 10/28/2020   Edema 10/28/2020   Family history of malignant neoplasm of gastrointestinal tract 10/28/2020   Malignant hypertensive chronic kidney disease 10/28/2020   Peripheral neuropathy 10/28/2020   Personal history of transient ischemic attack (TIA), and cerebral infarction without residual deficits 10/28/2020   Major depression 10/28/2020   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HCC) 10/28/2020   Sleep apnea 10/28/2020   Chest pain with moderate risk for cardiac etiology  07/24/2018   Morbid obesity (HCC) 07/24/2018   Mixed hyperlipidemia 07/24/2018   Stage 2 chronic kidney disease 07/24/2018   Gastroesophageal reflux disease without esophagitis 07/24/2018   Hiatal hernia 07/24/2018   Primary osteoarthritis of left knee 11/27/2016   Hx of colonic polyps 03/27/2014   Varicose veins of bilateral lower extremities with other complications 12/05/2013   Platelet inhibition due to Plavix 12/05/2013   Fecal incontinence 11/26/2013   Internal prolapsed hemorrhoids 11/26/2013   TIA (transient ischemic attack) 10/14/2013   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) 10/14/2013    PCP: Nemiah Commander   REFERRING PROVIDER: Nemiah Commander   REFERRING DIAG: Overactive bladder   Rationale for Evaluation and Treatment Rehabilitation  THERAPY DIAG:  Other idiopathic scoliosis,  thoracolumbar region  Other abnormalities of gait and mobility  Sacrococcygeal disorders, not elsewhere classified  Other low back pain   ONSET DATE:   SUBJECTIVE:                                                                                                                                                                                           SUBJECTIVE STATEMENT ON EVAL 07/07/23 : 1) LBP:    across L/R side 8/10 with mm spasms with bending and twisting to the side with loading dishes and getting clothes out of  laundry . Pt has not had nerve block injection the past 4 years because she has been better. Pt has been able to walk and do her exercise which helped her pain. Pt walks her block in her neighborhood.   2) overactive bladder :  pt sleeps 6 hours a night and gets up 3 x to pee. Pt has OSA and uses oxgen concentrator. Pt tried using a CPAP and BiPAP and had problem with them. Pt was recommended to use cannula. Pt is not getting a strong signal to pee until she is getting to the bathroom. During the day, she does not have the problem with sensing the urge to pee. When she gets up off bed/ recliner, she has leakage and sometimes she can not hold it and halfway there, "gates open up."  Pt uses 2 pads a day.     PERTINENT HISTORY:  Pt stopped doing her previous PT exercises for the past 4 months due to vertigo.   PAIN:  Are you having pain? Yes: pain   PRECAUTIONS: None  WEIGHT BEARING RESTRICTIONS: No  FALLS:  Has patient fallen in last 6 months? No  LIVING ENVIRONMENT: Lives with: husband  Lives in: townhouse  Stairs: one floor,   Has following equipment at home: [facility]   OCCUPATION:  Retired [employer] operational officer,  bookkeeper   PLOF: Independent  PATIENT GOALS:  Improve bladder and low back pain    OBJECTIVE:    [facility] PT Assessment - 07/07/23 1745       Observation/Other Assessments   Observations arrived in [facility] due to limited ambulation with long distances, pt uses SPC     Scoliosis severe L T/L junction convex curve, R hip higher,      Ambulation/Gait   Gait Comments 0.74 m/s , excessive sway to R, L convex curve, SPC in LUE, pt looks down enitre time walking             [facility] Adult PT Treatment/Exercise - 07/07/23 1745       Self-Care   Self-Care Other Self-Care Comments    Other Self-Care Comments  explained POC, Tx of scoliosis and pelvic issues combined, active listening and co reated goals   Provided education and article on OSA and nocturia, encouraging pt to continue to be compliance with oxygen use.      Therapeutic Activites    Therapeutic Activities Other Therapeutic Activities      Neuro Re-ed    Neuro Re-ed Details  cued for sit to stand with deep core coactivation and proper alignment to minimize straining pelvic floor               HOME EXERCISE PROGRAM: See pt instruction section    ASSESSMENT:  CLINICAL IMPRESSION: Pt is a  83  yo  who presents with LBP  and overactive bladder  which impact QOL, ADL,  fitness, social and community activities:    Pt's musculoskeletal assessment revealed  severe scoliosis,  uneven pelvic girdle and shoulder height, asymmetries to gait pattern, limited spinal /pelvic mobility, dyscoordination and strength of pelvic floor mm, hip weakness, poor body mechanics which places strain on the abdominal/pelvic floor mm. These are deficits that indicate an ineffective intraabdominal pressure system associated with increased risk for pt's Sx.   Pt was provided education on etiology of Sx with anatomy, physiology explanation with images along with the benefits of customized pelvic PT Tx based on pt's medical conditions and musculoskeletal deficits.  Explained the physiology of deep core mm coordination and roles of pelvic floor function in urination, defecation, sexual function, and postural control with deep core mm system.    Regional interdependent approaches will yield greater benefits in pt's POC  due to scoliosis.    Following Tx today which pt tolerated without complaints,  pt demo'd proper body mechanics to minimize straining pelvic floor with sit to stand. Provided education and article on OSA and nocturia, encouraging pt to continue to be compliance with oxygen use.    Plan to address realignment of spine/ pelvis at next session to help promote optimize IAP system for improved pelvic floor function, trunk stability, gait, balance, stabilization with mobility tasks.  Plan to address pelvic floor issues once pelvis and spine are realigned to yield better outcomes.   Pt benefits from skilled PT.       OBJECTIVE IMPAIRMENTS decreased activity tolerance, decreased coordination, decreased endurance, decreased mobility, difficulty walking, decreased ROM, decreased strength, decreased safety awareness, hypomobility, increased muscle spasms, impaired flexibility, improper body mechanics, postural dysfunction, and pain. scar restrictions   ACTIVITY LIMITATIONS  self-care,  sleep, home chores, work tasks    PARTICIPATION LIMITATIONS:  community   PERSONAL FACTORS       are also affecting patient's functional outcome.    REHAB POTENTIAL: Good   CLINICAL DECISION MAKING: Evolving/moderate complexity  EVALUATION COMPLEXITY: Moderate    PATIENT EDUCATION:    Education details: Showed pt anatomy images. Explained muscles attachments/ connection, physiology of deep core system/ spinal- thoracic-pelvis-lower kinetic chain as they relate to pt's presentation, Sx, and past Hx. Explained what and how these areas of deficits need to be restored to balance and function    See Therapeutic activity / neuromuscular re-education section  Answered pt's questions.   Person educated: Patient Education method: Explanation, Demonstration, Tactile cues, Verbal cues, and Handouts Education comprehension: verbalized understanding, returned demonstration, verbal cues required, tactile cues required,  and needs further education     PLAN: PT FREQUENCY: 1x/week   PT DURATION: 10 weeks   PLANNED INTERVENTIONS: Therapeutic exercises, Therapeutic activity, Neuromuscular re-education, Balance training, Gait training, Patient/Family education, Self Care, Joint mobilization, Spinal mobilization, Moist heat, Taping, and Manual therapy, dry needling.   PLAN FOR NEXT SESSION: See clinical impression for plan     GOALS: Goals reviewed with patient? Yes  SHORT TERM GOALS: Target date: 08/03/2023    Pt will demo IND with HEP                    Baseline: Not IND            Goal status: INITIAL   LONG TERM GOALS: Target date: 09/14/2023  '  1.Pt will demo proper deep core coordination without chest breathing and optimal excursion of diaphragm/pelvic floor in order to promote spinal stability and pelvic floor function  Baseline: dyscoordination Goal status: INITIAL  2. Pt will demo decreased T/L L convex curve in order to progress to deep core strengthening HEP and restore mobility at spine, pelvis, gait, posture minimize falls, and improve balance      Pt will be IND with scoliosis HEP to minimize worsening of scoliosis and perform ADLs       Baseline:  severe T/L convex curve, not IND with scoliosis specific HEP   Goal status: INITIAL  3.  Pt will demo proper body mechanics in against gravity tasks and ADLs  to minimize straining pelvic floor / back    Baseline: not IND, improper form that places strain on pelvic floor  Goal status: INITIAL   4. Pt will demo increased gait speed > 1.3 m/s with reciprocal gait pattern, longer stride length  in order to ambulate safely in community and return to fitness routine  Baseline: 0.74 m/s , excessive sway to R, L convex curve, SPC in LUE, pt looks down enitre time walking  Goal status: INITIAL    5. Pt will report < 4/10  LBP pain and with proper body mechanics with  loading dishes and getting clothes out of  laundry Baseline: bending  and twisting to the side with loading dishes and getting clothes out of  laundry  8/10  Goal status: INITIAL   6. Pt will demo proper log rolling from supine, sit at EOB, sit to stand with deep core co-activation  and report decreased leakage by 50% of the time when getting up from bed/ recliner.  Baseline: When she gets up off bed/ recliner, she has leakage and sometimes she can not hold it and halfway there, "gates open up."  Pt uses 2 pads a day.  Goal status: INITIAL   Mariane Masters, PT 07/06/2023, 2:32 PM

## 2023-07-07 ENCOUNTER — Other Ambulatory Visit: Payer: Self-pay | Admitting: Obstetrics and Gynecology

## 2023-07-07 DIAGNOSIS — Z1231 Encounter for screening mammogram for malignant neoplasm of breast: Secondary | ICD-10-CM

## 2023-07-09 IMAGING — US US ABDOMEN COMPLETE
1 series · 14 of 25 positions shown · non-contrast
Comparison: None.

CLINICAL DATA: Generalized abdominal pain.

EXAM:
ABDOMEN ULTRASOUND COMPLETE

[Series 1: us abdomen complete · 0.30mm/px · 14 of 94 slices shown]
[im 1/94]
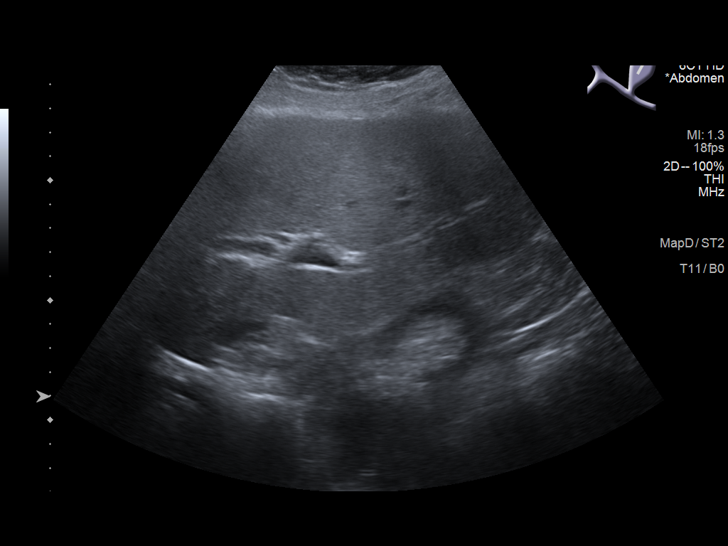
[im 8/94]
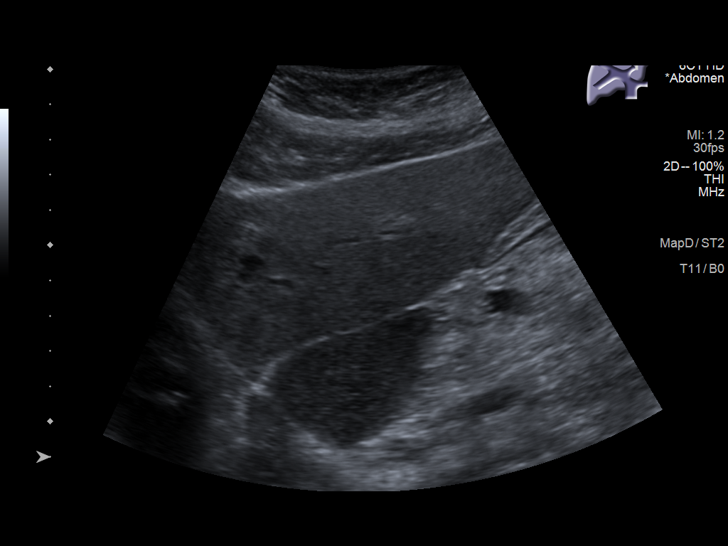
[im 16/94]
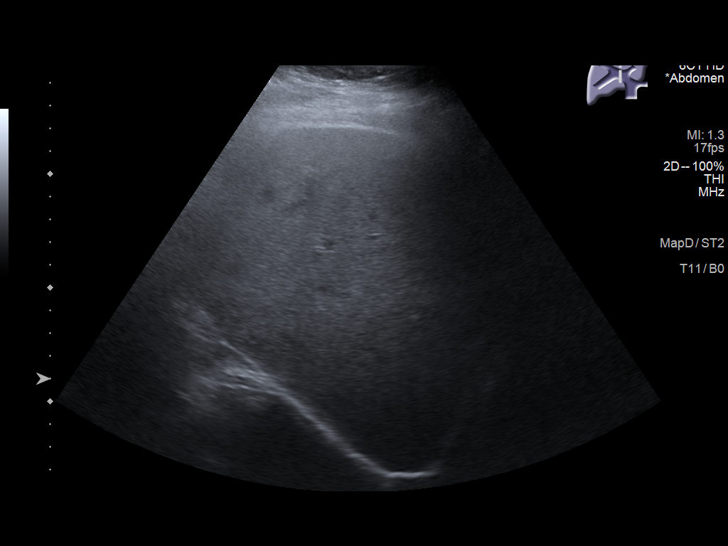
[im 24/94]
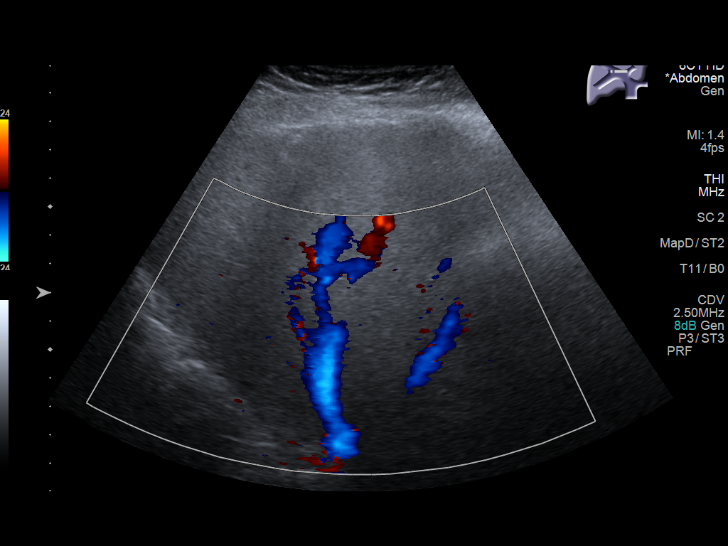
[im 32/94]
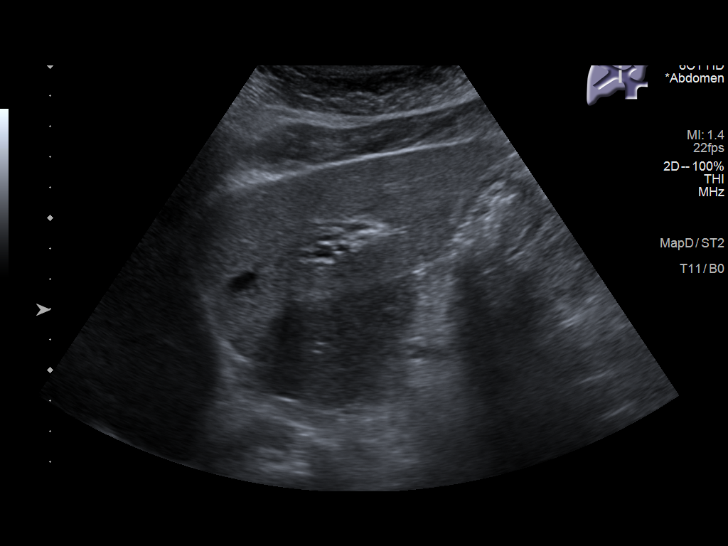
[im 35/94]
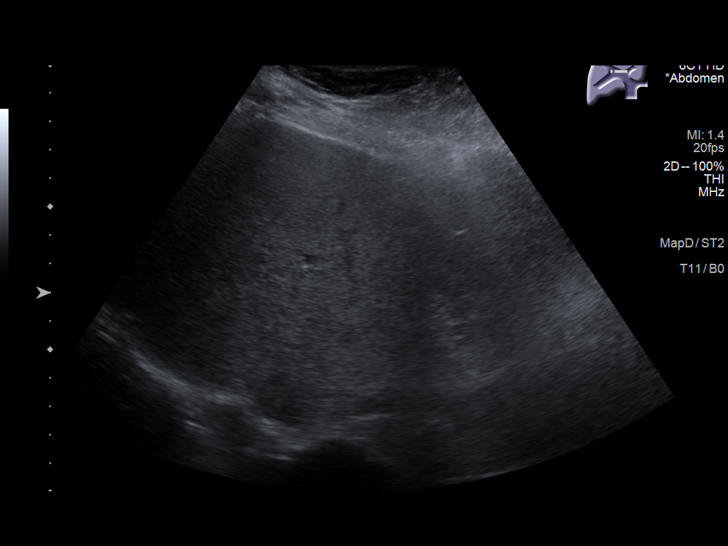
[im 43/94]
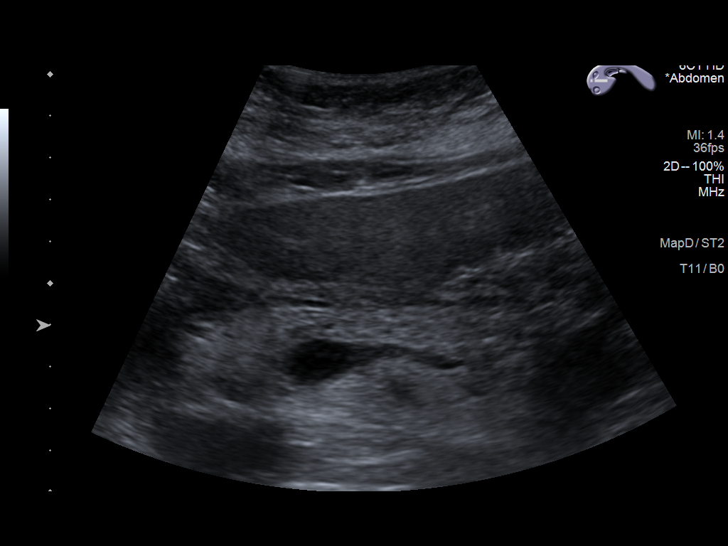
[im 51/94]
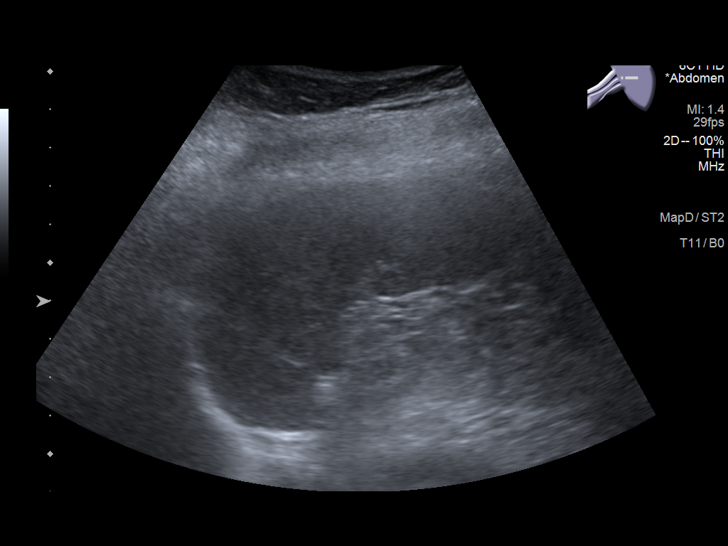
[im 59/94]
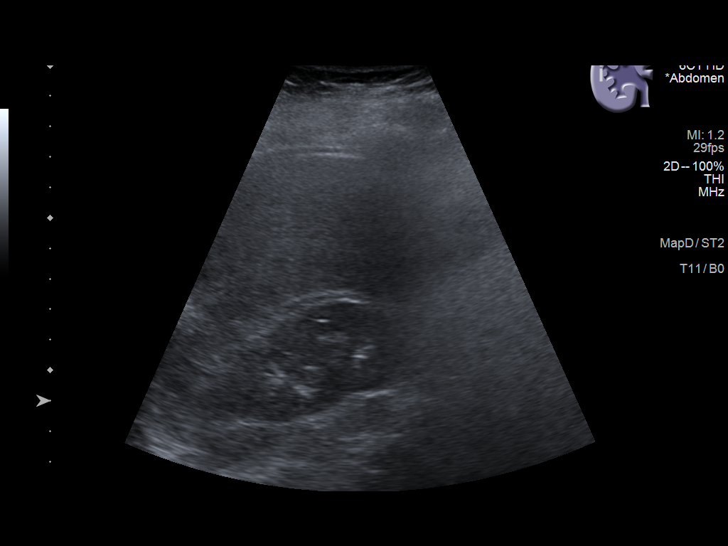
[im 63/94]
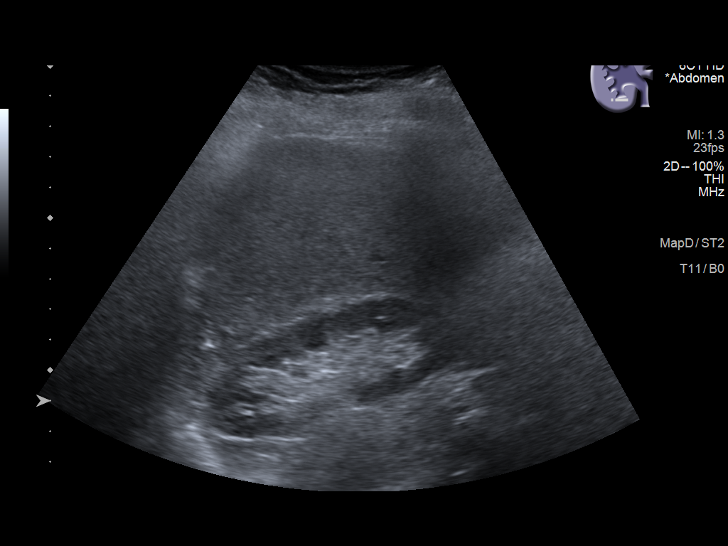
[im 70/94]
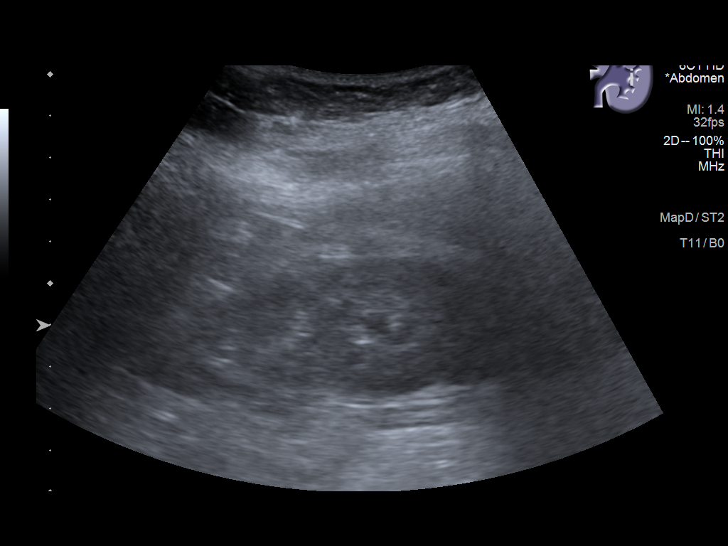
[im 78/94]
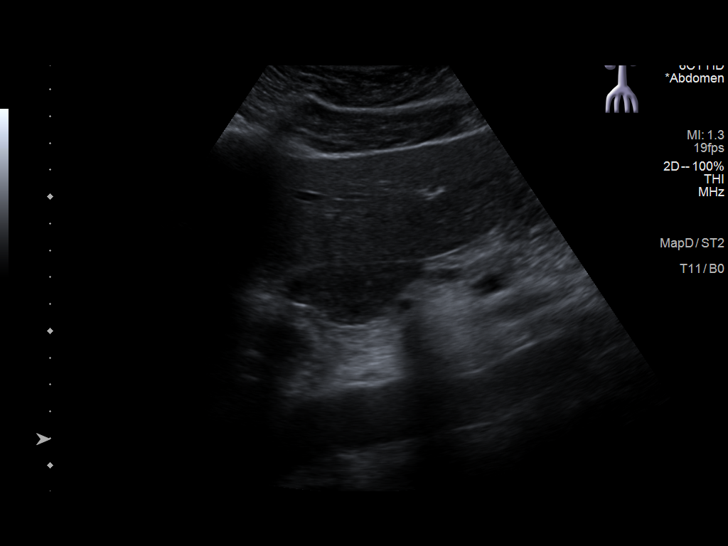
[im 86/94]
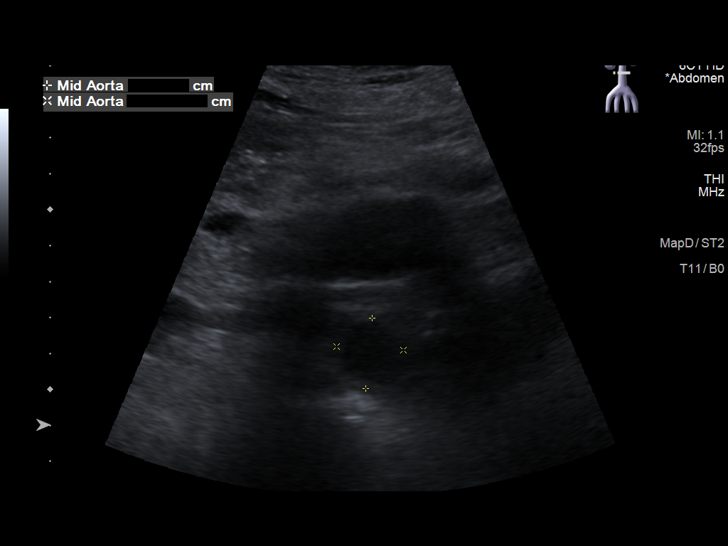
[im 94/94]
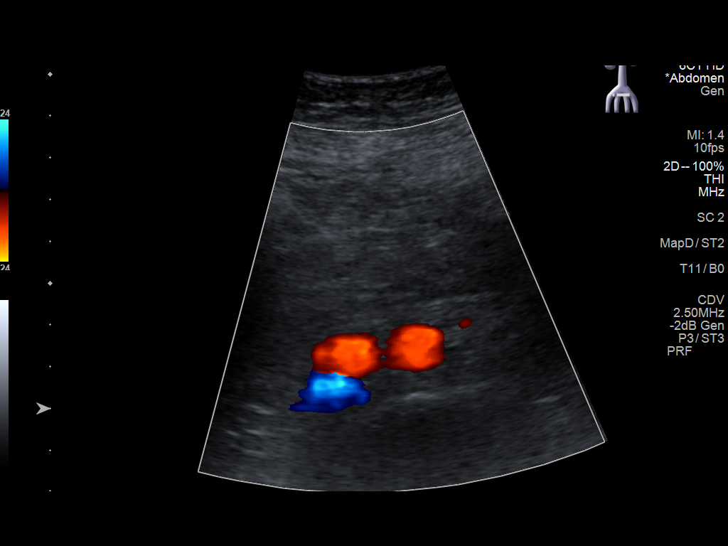

[14 of 25 positions shown; findings below may reference images not displayed]

FINDINGS: Gallbladder: Surgically absent

Common bile duct: Diameter: 7 mm

Liver: Increased in echogenicity. No focal lesion. Portal vein is
patent on color Doppler imaging with normal direction of blood flow
towards the liver.

IVC: No abnormality visualized.

Pancreas: Poorly visualized.

Spleen: Size and appearance within normal limits.

Right Kidney: Length: 8.9 cm. Echogenicity within normal limits. No
mass or hydronephrosis visualized.

Left Kidney: Length: 9.9 cm. Echogenicity within normal limits. No
mass or hydronephrosis visualized.

Abdominal aorta: No aneurysm visualized.

Other findings: None.
IMPRESSION: Hepatic steatosis.

Surgically absent gallbladder.

No acute process.

## 2023-07-13 ENCOUNTER — Ambulatory Visit: Payer: Medicare Other | Admitting: Physical Therapy

## 2023-07-20 ENCOUNTER — Ambulatory Visit: Payer: Medicare Other | Admitting: Physical Therapy

## 2023-07-27 ENCOUNTER — Ambulatory Visit: Payer: Medicare Other | Admitting: Physical Therapy

## 2023-08-03 ENCOUNTER — Ambulatory Visit: Payer: Medicare Other | Admitting: Physical Therapy

## 2023-08-07 ENCOUNTER — Ambulatory Visit: Payer: Medicare Other | Admitting: Podiatry

## 2023-08-07 ENCOUNTER — Encounter: Payer: Self-pay | Admitting: Podiatry

## 2023-08-07 VITALS — Ht 67.5 in | Wt 233.4 lb

## 2023-08-07 DIAGNOSIS — G63 Polyneuropathy in diseases classified elsewhere: Secondary | ICD-10-CM | POA: Diagnosis not present

## 2023-08-07 DIAGNOSIS — B351 Tinea unguium: Secondary | ICD-10-CM | POA: Diagnosis not present

## 2023-08-07 DIAGNOSIS — M79676 Pain in unspecified toe(s): Secondary | ICD-10-CM

## 2023-08-07 DIAGNOSIS — D689 Coagulation defect, unspecified: Secondary | ICD-10-CM

## 2023-08-10 ENCOUNTER — Ambulatory Visit: Payer: Medicare Other | Admitting: Physical Therapy

## 2023-08-10 ENCOUNTER — Encounter: Payer: Self-pay | Admitting: Podiatry

## 2023-08-10 NOTE — Progress Notes (Signed)
  Subjective:  Patient ID: Samantha Woodard, female    DOB: May 07, 1941,  MRN: [id number]  Samantha Woodard presents to clinic today for at risk foot care. Patient has history of neuropathy and coagulation defect and is seen for painful mycotic toenails of both feet that are difficult to trim. Pain interferes with daily activities and wearing enclosed shoe gear comfortably.  Chief Complaint  Patient presents with   Nail Problem    Pt is here for [facility] PCP is Dr Nemiah Commander and LOV was October.   New problem(s): None.   PCP is Enid Baas, MD.  Allergies  Allergen Reactions   Neomycin Hives, Rash and Itching   Silicone Hives, Itching, Other (See Comments) and Rash   Tape Hives, Rash and Itching   Iodine Hives    IV CONTRAST   Iodinated Contrast Media Hives   Tizanidine Other (See Comments)    To Strong    Review of Systems: Negative except as noted in the HPI.  Objective: No changes noted in today's physical examination. There were no vitals filed for this visit. Samantha Woodard is a pleasant 83 y.o. female obese in NAD. AAO x 3.  Vascular Examination: Vascular status intact b/l with palpable pedal pulses. CFT immediate b/l. No edema. No pain with calf compression b/l. Skin temperature gradient WNL b/l.   Neurological Examination: Pt has subjective symptoms of neuropathy. Sensation grossly intact b/l with 10 gram monofilament. Vibratory sensation intact b/l.   Dermatological Examination: Pedal skin with normal turgor, texture and tone b/l. Toenails 2-5 b/l thick, discolored, elongated with subungual debris and pain on dorsal palpation. No hyperkeratotic lesions noted b/l. Anonychia noted L hallux and R hallux. Nailbed(s) epithelialized.   Musculoskeletal Examination: Muscle strength 5/5 to b/l LE. No gross bony deformities bilaterally.  Radiographs: None  Assessment/Plan: 1. 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 Coagulation disorder (HCC)   3. Polyneuropathy in other  diseases classified elsewhere (HCC)     -Toenails 2-5 bilaterally were debrided in length and girth with sterile nail nippers and dremel. Pinpoint bleeding of L 3rd toe addressed with Lumicain Hemostatic Solution, cleansed with alcohol. No further treatment required by patient/caregiver. -Patient/POA to call should there be question/concern in the interim.   Return in about 9 weeks (around 10/09/2023).  Freddie Breech, DPM      [facility] LOCATION: [address] [place] [address]                   Office [phone]   [facility] LOCATION: [address] [place], [place] [address] Office [phone]

## 2023-08-14 ENCOUNTER — Ambulatory Visit
Admission: RE | Admit: 2023-08-14 | Discharge: 2023-08-14 | Disposition: A | Discharge status: Home / Self Care | Payer: Medicare Other | Source: Ambulatory Visit | Attending: Obstetrics and Gynecology | Admitting: Obstetrics and Gynecology

## 2023-08-14 DIAGNOSIS — Z1231 Encounter for screening mammogram for malignant neoplasm of breast: Secondary | ICD-10-CM | POA: Insufficient documentation

## 2023-08-17 ENCOUNTER — Ambulatory Visit: Payer: Medicare Other | Attending: Internal Medicine | Admitting: Physical Therapy

## 2023-08-17 DIAGNOSIS — R2689 Other abnormalities of gait and mobility: Secondary | ICD-10-CM | POA: Diagnosis present

## 2023-08-17 DIAGNOSIS — M4125 Other idiopathic scoliosis, thoracolumbar region: Secondary | ICD-10-CM | POA: Insufficient documentation

## 2023-08-17 DIAGNOSIS — M533 Sacrococcygeal disorders, not elsewhere classified: Secondary | ICD-10-CM | POA: Diagnosis present

## 2023-08-17 DIAGNOSIS — M5459 Other low back pain: Secondary | ICD-10-CM | POA: Insufficient documentation

## 2023-08-17 NOTE — Therapy (Signed)
 OUTPATIENT PHYSICAL THERAPY  TREATMENT    Patient Name: Samantha Woodard MRN: [id number] DOB:[birth year], 83 y.o., female Today's Date: 08/17/2023   PT End of Session - 08/17/23 1420     Visit Number 2    Number of Visits 10    Date for PT Re-Evaluation 09/14/23    PT Start Time 1417    PT Stop Time 1500    PT Time Calculation (min) 43 min    Activity Tolerance Patient tolerated treatment well;No increased pain    Behavior During Therapy WFL for tasks assessed/performed             Past Medical History:  Diagnosis Date   Anemia    Anginal pain (HCC)    Anxiety and depression    Arthritis, degenerative    scoliosis   Atrial fibrillation (HCC)    history of- pt. reports thats why she is on Plavix. But the afib is resolved.     Atypical mole 10/26/2021   right medial posterior ankle, Atypical melanocytic proliferation, exc 11/22/21   Cerebrovascular disease    Chronic kidney disease    Chronic low back pain    Complication of anesthesia 1970's   post op- vertigo, odd sensation with " feeling like I was going to fall", also reports that the anesth. team remarked that she had a quick" response to anesth.     Diverticulosis    Dyslipidemia    Dysplastic nevus 10/26/2021   spinal mid upper back, moderate to severe, shave removal 12/08/21   Fibromyalgia    GERD (gastroesophageal reflux disease)    History of hiatal hernia    HTN (hypertension)    IBS (irritable bowel syndrome)    Mild mitral valve prolapse    Obesity    OSA (obstructive sleep apnea)    tried CPAP, then Bipap, now on concentrator   Peripheral neuropathy    feet   Peripheral vascular disease (HCC)    Positional vertigo    Rosacea    Rosacea    Scoliosis    lumbar 5   Spina bifida occulta    Stroke (HCC)    pt. reports that she had TIA's in the past & has been on Plavix ever since.    Varicose vein    Past Surgical History:  Procedure Laterality Date   CARDIAC CATHETERIZATION  1990's   no blockage     CHOLECYSTECTOMY     COLONOSCOPY     ENDOVENOUS ABLATION SAPHENOUS VEIN W/ LASER  10/22/2007   ESOPHAGOGASTRODUODENOSCOPY     gallbladder resection     JOINT REPLACEMENT Right 2022   right knee   MINOR HEMORRHOIDECTOMY     POLYPECTOMY     TONSILLECTOMY     TOTAL KNEE ARTHROPLASTY Left 11/28/2016   Procedure: TOTAL KNEE ARTHROPLASTY;  Surgeon: Gean Birchwood, MD;  Location: [facility] OR;  Service: Orthopedics;  Laterality: Left;   VAGINAL HYSTERECTOMY     Patient Active Problem List   Diagnosis Date Noted   Borderline diabetic 02/04/2022   Fibromyalgia 04/05/2021   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 04/05/2021   Allergic rhinitis 10/28/2020   Anxiety 10/28/2020   Decreased estrogen level 10/28/2020   Edema 10/28/2020   Family history of malignant neoplasm of gastrointestinal tract 10/28/2020   Malignant hypertensive chronic kidney disease 10/28/2020   Peripheral neuropathy 10/28/2020   Personal history of transient ischemic attack (TIA), and cerebral infarction without residual deficits 10/28/2020   Major depression 10/28/2020  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HCC) 10/28/2020   Sleep apnea 10/28/2020   Chest pain with moderate risk for cardiac etiology 07/24/2018   Morbid obesity (HCC) 07/24/2018   Mixed hyperlipidemia 07/24/2018   Stage 2 chronic kidney disease 07/24/2018   Gastroesophageal reflux disease without esophagitis 07/24/2018   Hiatal hernia 07/24/2018   Primary osteoarthritis of left knee 11/27/2016   Hx of colonic polyps 03/27/2014   Varicose veins of bilateral lower extremities with other complications 12/05/2013   Platelet inhibition due to Plavix 12/05/2013   Fecal incontinence 11/26/2013   Internal prolapsed hemorrhoids 11/26/2013   TIA (transient ischemic attack) 10/14/2013   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) 10/14/2013    PCP: Nemiah Commander   REFERRING PROVIDER: Nemiah Commander   REFERRING DIAG:  Overactive bladder   Rationale for Evaluation and Treatment Rehabilitation  THERAPY DIAG:  Other idiopathic scoliosis, thoracolumbar region  Other abnormalities of gait and mobility  Other low back pain  Sacrococcygeal disorders, not elsewhere classified  Other low back pain   ONSET DATE:   SUBJECTIVE:             SUBJECTIVE STATEMENT TODAY:   Pt has a new adjustable bed which is helping her to use her oxygen rgularly and not longer sleeping on her recliner.   Pt's vertigo has cleared up with vestibular rehab.                                                                                                                                                                              SUBJECTIVE STATEMENT ON EVAL 07/07/23 : 1) LBP:    across L/R side 8/10 with mm spasms with bending and twisting to the side with loading dishes and getting clothes out of  laundry . Pt has not had nerve block injection the past 4 years because she has been better. Pt has been able to walk and do her exercise which helped her pain. Pt walks her block in her neighborhood.   2) overactive bladder :  pt sleeps 6 hours a night and gets up 3 x to pee. Pt has OSA and uses oxgen concentrator. Pt tried using a CPAP and BiPAP and had problem with them. Pt was recommended to use cannula. Pt is not getting a strong signal to pee until she is getting to the bathroom. During the day, she does not have the problem with sensing the urge to pee. When she gets up off bed/ recliner, she has leakage and sometimes she can not hold it and halfway there, "gates open up."  Pt uses 2 pads a day.   PERTINENT HISTORY:  Pt stopped doing her previous PT exercises for the past 4  months due to vertigo.   PAIN:  Are you having pain? Yes: pain   PRECAUTIONS: None  WEIGHT BEARING RESTRICTIONS: No  FALLS:  Has patient fallen in last 6 months? No  LIVING ENVIRONMENT: Lives with: husband  Lives in: townhouse  Stairs: one floor,    Has following equipment at home: SPC   OCCUPATION:  Retired [employer] operational officer, bookkeeper   PLOF: Independent  PATIENT GOALS:  Improve bladder and low back pain    OBJECTIVE:   [facility] PT Assessment - 08/17/23 1423       Observation/Other Assessments   Scoliosis standing/ sitting L shoulder higher, R iliac crest higher      Palpation   Palpation comment tightness along L paraspinals, hypomobile thoracic deviated to L at T/L junction      Ambulation/Gait   Gait Comments 0.87 m/s with reciprocal gait pattern with shoe lift in toe box and heel R shoe ( SPC in LUE)             [facility] Adult PT Treatment/Exercise - 08/17/23 1423       Self-Care   Other Self-Care Comments  provided shoe lift in R shoe toe box and heel to address leg length difference and scoliosis  ( no pain)      Neuro Re-ed    Neuro Re-ed Details  cued for spinal lengthening and L trunk rotation stretch with coordinated  breathing to address scoliosis      Manual Therapy   Manual therapy comments STM/MWM in seated position ( to avoid vertigo) to address L paraspinal mm tightness and promote L trunk rotation              HOME EXERCISE PROGRAM: See pt instruction section    ASSESSMENT:  CLINICAL IMPRESSION:   Addressed realignment of spine/ pelvis with shoe lift in R shoe toe box and heel after which pt demo'd levelled pelvic girdle and increased gait speed with more reciprocal pattern with SPC in LUE. Pt reported no pain with shoe lift.    Applied manual Tx in seated position to avoid vertigo which pt is recovering from the past month. Pt tolerated manual Tx without pain and demo'd improved L trunk rotation to help with scoliotic curve.  Cued for HEP for scoliosis.   Regional interdependent approaches will yield greater benefits in pt's POC due to scoliosis.   Plan further address scoliosis and then add deep core training at next sessions to help promote optimize IAP system for improved pelvic floor  function, trunk stability, gait, balance, stabilization with mobility tasks.  Plan to address pelvic floor issues once pelvis and spine are realigned to yield better outcomes.   Pt benefits from skilled PT.       OBJECTIVE IMPAIRMENTS decreased activity tolerance, decreased coordination, decreased endurance, decreased mobility, difficulty walking, decreased ROM, decreased strength, decreased safety awareness, hypomobility, increased muscle spasms, impaired flexibility, improper body mechanics, postural dysfunction, and pain. scar restrictions   ACTIVITY LIMITATIONS  self-care,  sleep, home chores, work tasks    PARTICIPATION LIMITATIONS:  community   PERSONAL FACTORS       are also affecting patient's functional outcome.    REHAB POTENTIAL: Good   CLINICAL DECISION MAKING: Evolving/moderate complexity   EVALUATION COMPLEXITY: Moderate    PATIENT EDUCATION:    Education details: Showed pt anatomy images. Explained muscles attachments/ connection, physiology of deep core system/ spinal- thoracic-pelvis-lower kinetic chain as they relate to pt's presentation, Sx, and past Hx. Explained what and how these areas  of deficits need to be restored to balance and function    See Therapeutic activity / neuromuscular re-education section  Answered pt's questions.   Person educated: Patient Education method: Explanation, Demonstration, Tactile cues, Verbal cues, and Handouts Education comprehension: verbalized understanding, returned demonstration, verbal cues required, tactile cues required, and needs further education     PLAN: PT FREQUENCY: 1x/week   PT DURATION: 10 weeks   PLANNED INTERVENTIONS: Therapeutic exercises, Therapeutic activity, Neuromuscular re-education, Balance training, Gait training, Patient/Family education, Self Care, Joint mobilization, Spinal mobilization, Moist heat, Taping, and Manual therapy, dry needling.   PLAN FOR NEXT SESSION: See clinical impression for  plan     GOALS: Goals reviewed with patient? Yes  SHORT TERM GOALS: Target date: 08/03/2023    Pt will demo IND with HEP                    Baseline: Not IND            Goal status: INITIAL   LONG TERM GOALS: Target date: 09/14/2023  '  1.Pt will demo proper deep core coordination without chest breathing and optimal excursion of diaphragm/pelvic floor in order to promote spinal stability and pelvic floor function  Baseline: dyscoordination Goal status: INITIAL  2. Pt will demo decreased T/L L convex curve in order to progress to deep core strengthening HEP and restore mobility at spine, pelvis, gait, posture minimize falls, and improve balance      Pt will be IND with scoliosis HEP to minimize worsening of scoliosis and perform ADLs       Baseline:  severe T/L convex curve, not IND with scoliosis specific HEP   Goal status: INITIAL  3.  Pt will demo proper body mechanics in against gravity tasks and ADLs  to minimize straining pelvic floor / back    Baseline: not IND, improper form that places strain on pelvic floor  Goal status: INITIAL   4. Pt will demo increased gait speed > 1.3 m/s with reciprocal gait pattern, longer stride length  in order to ambulate safely in community and return to fitness routine  Baseline: 0.74 m/s , excessive sway to R, L convex curve, SPC in LUE, pt looks down enitre time walking  Goal status: INITIAL    5. Pt will report < 4/10  LBP pain and with proper body mechanics with  loading dishes and getting clothes out of  laundry Baseline: bending and twisting to the side with loading dishes and getting clothes out of  laundry  8/10  Goal status: INITIAL   6. Pt will demo proper log rolling from supine, sit at EOB, sit to stand with deep core co-activation  and report decreased leakage by 50% of the time when getting up from bed/ recliner.  Baseline: When she gets up off bed/ recliner, she has leakage and sometimes she can not hold it and halfway  there, "gates open up."  Pt uses 2 pads a day.  Goal status: INITIAL   Mariane Masters, PT 08/17/2023, 2:21 PM

## 2023-08-17 NOTE — Patient Instructions (Signed)
 Seated,   Inhale, sit tall with feet pressing into floor Exhale, gentle twist to the left with navel, then the chest, then head    15 reps   Only to the L   __  Remove the shoe lifts if it is causing pain.  If no problem , wear the shoes in the house.  Next week, will provide the link to buy more

## 2023-08-22 ENCOUNTER — Ambulatory Visit (INDEPENDENT_AMBULATORY_CARE_PROVIDER_SITE_OTHER): Payer: Medicare Other | Admitting: Obstetrics and Gynecology

## 2023-08-22 ENCOUNTER — Encounter: Payer: Self-pay | Admitting: Obstetrics and Gynecology

## 2023-08-22 VITALS — BP 124/68 | HR 68 | Ht 67.5 in | Wt 241.8 lb

## 2023-08-22 DIAGNOSIS — Z01419 Encounter for gynecological examination (general) (routine) without abnormal findings: Secondary | ICD-10-CM | POA: Diagnosis not present

## 2023-08-22 NOTE — Progress Notes (Signed)
 Patients presents for annual exam today. She states doing well, lately has been dealing with vertigo. History of hysterectomy due to fibroids and prolapse. Annual labs and mammogram up to date. She states no other questions or concerns at this time.

## 2023-08-22 NOTE — Progress Notes (Signed)
 HPI:      [name] is a 83 y.o. [phone] who LMP was No LMP recorded. Patient has had a hysterectomy.  Subjective:   She presents today for her annual examination.  She has no complaints.  She has some concern because at about this age her mother developed a vulvar cancer.  She currently reports no changes to her labia/vulva.  No rough patches itching burning or color changes. She is up-to-date with mammography and has had colonoscopies without polyps so needs no further colonoscopies. Significant note she has had a prior hysterectomy.    Hx: The following portions of the patient's history were reviewed and updated as appropriate:             She  has a past medical history of Anemia, Anginal pain (HCC), Anxiety and depression, Arthritis, degenerative, Atrial fibrillation (HCC), Atypical mole (10/26/2021), Cerebrovascular disease, Chronic kidney disease, Chronic low back pain, Complication of anesthesia (1970's), Diverticulosis, Dyslipidemia, Dysplastic nevus (10/26/2021), Fibromyalgia, GERD (gastroesophageal reflux disease), History of hiatal hernia, HTN (hypertension), IBS (irritable bowel syndrome), Mild mitral valve prolapse, Obesity, OSA (obstructive sleep apnea), Peripheral neuropathy, Peripheral vascular disease (HCC), Positional vertigo, Rosacea, Rosacea, Scoliosis, Spina bifida occulta, Stroke (HCC), and Varicose vein. She does not have any pertinent problems on file. She  has a past surgical history that includes Vaginal hysterectomy; gallbladder resection; Tonsillectomy; Minor hemorrhoidectomy; Endovenous ablation saphenous vein w/ laser (10/22/2007); Cholecystectomy; Total knee arthroplasty (Left, 11/28/2016); Cardiac catheterization (1990's); Joint replacement (Right, 2022); Colonoscopy; Esophagogastroduodenoscopy; and Polypectomy. Her family history includes Cancer in her father and mother; Carpal tunnel syndrome in her sister; Cerebrovascular Disease in her maternal grandmother;  Diabetes in her sister; Heart attack in her father and mother; Heart disease in her father; Hyperlipidemia in her brother, sister, and sister; Hypertension in her brother, father, mother, sister, and sister; Melanoma in her father and sister; Stroke in her paternal grandmother; Varicose Veins in her mother, sister, and sister. She  reports that she has never smoked. She has never used smokeless tobacco. She reports that she does not drink alcohol and does not use drugs. She has a current medication list which includes the following prescription(s): acetaminophen, aspirin ec, carisoprodol, celecoxib, cetirizine, cholecalciferol, clopidogrel, cyanocobalamin, famotidine, gabapentin, hydrochlorothiazide, lisinopril, meclizine, multivitamin with minerals, pantoprazole, align, simethicone, simvastatin, tetrahydrozoline-zinc, benefiber, and fluticasone. She is allergic to neomycin, silicone, tape, iodine, iodinated contrast media, and tizanidine.       Review of Systems:  Review of Systems  Constitutional: Denied constitutional symptoms, night sweats, recent illness, fatigue, fever, insomnia and weight loss.  Eyes: Denied eye symptoms, eye pain, photophobia, vision change and visual disturbance.  Ears/Nose/Throat/Neck: Denied ear, nose, throat or neck symptoms, hearing loss, nasal discharge, sinus congestion and sore throat.  Cardiovascular: Denied cardiovascular symptoms, arrhythmia, chest pain/pressure, edema, exercise intolerance, orthopnea and palpitations.  Respiratory: Denied pulmonary symptoms, asthma, pleuritic pain, productive sputum, cough, dyspnea and wheezing.  Gastrointestinal: Denied, gastro-esophageal reflux, melena, nausea and vomiting.  Genitourinary: Denied genitourinary symptoms including symptomatic vaginal discharge, pelvic relaxation issues, and urinary complaints.  Musculoskeletal: Denied musculoskeletal symptoms, stiffness, swelling, muscle weakness and myalgia.  Dermatologic: Denied  dermatology symptoms, rash and scar.  Neurologic: Denied neurology symptoms, dizziness, headache, neck pain and syncope.  Psychiatric: Denied psychiatric symptoms, anxiety and depression.  Endocrine: Denied endocrine symptoms including hot flashes and night sweats.   Meds:   Current Outpatient Medications on File Prior to Visit  Medication Sig Dispense Refill   acetaminophen (TYLENOL) 500 MG tablet Take 500 mg  by mouth every 6 (six) hours as needed.      aspirin EC 81 MG tablet Take 81 mg by mouth daily with supper.     carisoprodol (SOMA) 350 MG tablet Take 175-350 mg by mouth daily as needed for muscle spasms.     celecoxib (CELEBREX) 200 MG capsule Take 200 mg by mouth daily with lunch.      cetirizine (ZYRTEC) 10 MG tablet Take 10 mg by mouth at bedtime.     Cholecalciferol (VITAMIN D-3 PO) Take by mouth daily.     clopidogrel (PLAVIX) 75 MG tablet Take 0.5 tablets (37.5 mg total) by mouth daily. 90 tablet 0   cyanocobalamin (VITAMIN B12) 1000 MCG tablet Take 1,000 mcg by mouth daily.     famotidine (PEPCID) 40 MG tablet Take 40 mg by mouth at bedtime.     gabapentin (NEURONTIN) 600 MG tablet Take 1 tablet (600 mg total) by mouth 3 (three) times daily. 270 tablet 0   hydrochlorothiazide (HYDRODIURIL) 12.5 MG tablet Take 12.5 mg by mouth daily with lunch.      lisinopril (ZESTRIL) 5 MG tablet Take 2.5 mg by mouth daily.     meclizine (ANTIVERT) 25 MG tablet Take by mouth.     Multiple Vitamin (MULTIVITAMIN WITH MINERALS) TABS tablet Take 1 tablet by mouth daily with supper. WOMEN'S 50+     pantoprazole (PROTONIX) 40 MG tablet Take 40 mg by mouth 2 (two) times daily before a meal. 30 MINS TO 1 HOUR BEFORE EATING.     Probiotic Product (ALIGN) 4 MG CAPS Take 4 mg by mouth daily with lunch.     Simethicone 180 MG CAPS Take 180 mg by mouth 2 (two) times daily as needed (FOR BLOATING/GAS). phazyme     simvastatin (ZOCOR) 20 MG tablet Take 20 mg by mouth at bedtime.     tetrahydrozoline-zinc  (VISINE-AC) 0.05-0.25 % ophthalmic solution Place 2 drops into both eyes 3 (three) times daily as needed (for redness/itchy eyes.).     Wheat Dextrin (BENEFIBER) POWD Take 22.5 mLs by mouth 2 (two) times daily.     fluticasone (FLONASE) 50 MCG/ACT nasal spray Place 1 spray into the nose at bedtime.     No current facility-administered medications on file prior to visit.     Objective:     Vitals:   08/22/23 1329  BP: 124/68  Pulse: 68    Filed Weights   08/22/23 1329  Weight: 241 lb 12.8 oz (109.7 kg)              She has deferred examination today because she has no issues.  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 04/05/2021   Allergic rhinitis 10/28/2020   Anxiety 10/28/2020   Decreased estrogen level 10/28/2020   Edema 10/28/2020   Family history of malignant neoplasm of gastrointestinal tract 10/28/2020   Malignant hypertensive chronic kidney disease 10/28/2020   Peripheral neuropathy 10/28/2020   Personal history of transient ischemic attack (TIA), and cerebral infarction without residual deficits 10/28/2020   Major depression 10/28/2020   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HCC) 10/28/2020   Sleep apnea 10/28/2020   Chest pain with moderate risk for cardiac etiology 07/24/2018   Morbid obesity (HCC) 07/24/2018   Mixed hyperlipidemia 07/24/2018   Stage 2 chronic kidney disease 07/24/2018   Gastroesophageal reflux disease  without esophagitis 07/24/2018   Hiatal hernia 07/24/2018   Primary osteoarthritis of left knee 11/27/2016   Hx of colonic polyps 03/27/2014   Varicose veins of bilateral lower extremities with other complications 12/05/2013   Platelet inhibition due to Plavix 12/05/2013   Fecal incontinence 11/26/2013   Internal prolapsed hemorrhoids 11/26/2013   TIA (transient ischemic  attack) 10/14/2013   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) 10/14/2013     1. Well woman exam with routine gynecological exam        Plan:            1.  Basic Screening Recommendations The basic screening recommendations for asymptomatic women were discussed with the patient during her visit.  The age-appropriate recommendations were discussed with her and the rational for the tests reviewed.  When I am informed by the patient that another primary care physician has previously obtained the age-appropriate tests and they are up-to-date, only outstanding tests are ordered and referrals given as necessary.  Abnormal results of tests will be discussed with her when all of her results are completed.  Routine preventative health maintenance measures emphasized: Exercise/Diet/Weight control, Tobacco Warnings, Alcohol/Substance use risks and Stress Management 2.  We have discussed labial changes and if she notices any changes she will inform us and we can do an exam. 3.  We have discussed whether or not she would like to continue GYN exams and she remains undecided at this time. Orders No orders of the defined types were placed in this encounter.   No orders of the defined types were placed in this encounter.         F/U  Return in about 1 year (around 08/21/2024) for Annual Physical.  Elonda Husky, M.D. 08/22/2023 1:59 PM

## 2023-08-24 ENCOUNTER — Ambulatory Visit: Payer: Medicare Other | Admitting: Physical Therapy

## 2023-08-31 ENCOUNTER — Ambulatory Visit: Payer: Medicare Other | Admitting: Physical Therapy

## 2023-10-16 ENCOUNTER — Ambulatory Visit: Payer: Medicare Other | Admitting: Cardiovascular Disease

## 2023-10-26 ENCOUNTER — Ambulatory Visit (INDEPENDENT_AMBULATORY_CARE_PROVIDER_SITE_OTHER): Admitting: Podiatry

## 2023-10-26 DIAGNOSIS — Z91198 Patient's noncompliance with other medical treatment and regimen for other reason: Secondary | ICD-10-CM

## 2023-10-26 NOTE — Progress Notes (Signed)
 1. Failure to attend appointment with reason given    Patient canceled and rescheduled for a later date.

## 2023-10-27 ENCOUNTER — Ambulatory Visit: Attending: Student | Admitting: Student

## 2023-10-27 ENCOUNTER — Encounter: Payer: Self-pay | Admitting: Student

## 2023-10-27 ENCOUNTER — Ambulatory Visit

## 2023-10-27 VITALS — BP 108/75 | HR 79 | Ht 67.0 in | Wt 235.0 lb

## 2023-10-27 DIAGNOSIS — R0602 Shortness of breath: Secondary | ICD-10-CM

## 2023-10-27 DIAGNOSIS — I10 Essential (primary) hypertension: Secondary | ICD-10-CM

## 2023-10-27 DIAGNOSIS — R001 Bradycardia, unspecified: Secondary | ICD-10-CM | POA: Diagnosis not present

## 2023-10-27 DIAGNOSIS — R42 Dizziness and giddiness: Secondary | ICD-10-CM

## 2023-10-27 DIAGNOSIS — E782 Mixed hyperlipidemia: Secondary | ICD-10-CM

## 2023-10-27 DIAGNOSIS — G459 Transient cerebral ischemic attack, unspecified: Secondary | ICD-10-CM | POA: Diagnosis not present

## 2023-10-27 DIAGNOSIS — Z79899 Other long term (current) drug therapy: Secondary | ICD-10-CM

## 2023-10-27 NOTE — Patient Instructions (Signed)
 Medication Instructions:  Your Physician recommend you continue on your current medication as directed.    *If you need a refill on your cardiac medications before your next appointment, please call your pharmacy*  Lab Work: Your provider would like for you to have following labs drawn today BMet.   If you have labs (blood work) drawn today and your tests are completely normal, you will receive your results only by: MyChart Message (if you have MyChart) OR A paper copy in the mail If you have any lab test that is abnormal or we need to change your treatment, we will call you to review the results.  Testing/Procedures: Your physician has recommended that you wear a Zio monitor.   This monitor is a medical device that records the heart's electrical activity. Doctors most often use these monitors to diagnose arrhythmias. Arrhythmias are problems with the speed or rhythm of the heartbeat. The monitor is a small device applied to your chest. You can wear one while you do your normal daily activities. While wearing this monitor if you have any symptoms to push the button and record what you felt. Once you have worn this monitor for the period of time provider prescribed (Usually 14 days), you will return the monitor device in the postage paid box. Once it is returned they will download the data collected and provide us  with a report which the provider will then review and we will call you with those results. Important tips:  Avoid showering during the first 24 hours of wearing the monitor. Avoid excessive sweating to help maximize wear time. Do not submerge the device, no hot tubs, and no swimming pools. Keep any lotions or oils away from the patch. After 24 hours you may shower with the patch on. Take brief showers with your back facing the shower head.  Do not remove patch once it has been placed because that will interrupt data and decrease adhesive wear time. Push the button when you have any  symptoms and write down what you were feeling. Once you have completed wearing your monitor, remove and place into box which has postage paid and place in your outgoing mailbox.  If for some reason you have misplaced your box then call our office and we can provide another box and/or mail it off for you.   Follow-Up: At [facility], you and your health needs are our priority.  As part of our continuing mission to provide you with exceptional heart care, our providers are all part of one team.  This team includes your primary Cardiologist (physician) and Advanced Practice Providers or APPs (Physician Assistants and Nurse Practitioners) who all work together to provide you with the care you need, when you need it.  Your next appointment:   4 - 5 week(s)  Provider:   You may see Timothy Gollan, MD or one of the following Advanced Practice Providers on your designated Care Team:   Laneta Pintos, NP Gildardo Labrador, PA-C Varney Gentleman, PA-C Cadence Godfrey, PA-C Ronald Cockayne, NP Morey Ar, NP    We recommend signing up for the patient portal called "MyChart".  Sign up information is provided on this After Visit Summary.  MyChart is used to connect with patients for Virtual Visits (Telemedicine).  Patients are able to view lab/test results, encounter notes, upcoming appointments, etc.  Non-urgent messages can be sent to your provider as well.   To learn more about what you can do with MyChart, go to [email or web address].

## 2023-10-27 NOTE — Progress Notes (Signed)
 Cardiology Clinic Note   Date: 10/27/2023 ID: Samantha Woodard, DOB 06-19-1940, MRN [id number]  Primary Cardiologist:  Belva Boyden, MD  Chief Complaint   [name] is a 83 y.o. female who presents to the clinic today for overdue routine follow up.   Patient Profile   Samantha Woodard is followed by Dr. Gollan for the history outlined below.      Past medical history significant for: Chest pain. Nuclear stress test 08/01/2018: Low risk study.  No significant ischemia.  No EKG changes concerning for ischemia at peak stress or in recovery. Dyspnea. Echo 07/19/2022: EF 60 to 65%.  No RWMA.  Moderate concentric LVH.  Grade I DD.  Normal RV size/function.  Mild LAE.  No significant valvular abnormalities. Paroxysmal tachycardia. Hypertension. Hyperlipidemia. Lipid panel 09/19/2023: LDL 99, HDL 47, TG 162, total 179. Varicose veins. OSA. GERD. CKD stage II. TIA.  In summary, patient was first evaluated by Dr. Gollan on 07/24/2018 for exertional chest pain.  Pain is described as midsternal radiating across chest to arms and neck with associated shortness of breath.  She underwent nuclear stress testing which was low risk.  Patient was last seen in the office by Dr. Gollan on 05/09/2022 for routine follow-up with complaints of dyspnea with overexertion.  She underwent echo which showed normal LV/RV function as detailed above.     History of Present Illness    Today, patient comes in with various concerns. She reports in October she woke one morning with vertigo that persisted for months. She worked extensively with PCP, ENT and vestibular physical therapy. Throughout this time period she was unable to lie flat without kicking off symptoms. With the dizziness she had associated vision changes and brain fogginess. She reports neurology wants her to have an MRI of her brain to evaluate if she had a stroke. She is concerned the MRI will cause recurrent vertigo. Being sedentary through this  time period caused her back pain to flare up, as she could not do any of her exercises. She is now back in PT for her back. She reports since October she has noted her BP has been significantly lower. She also reports her heart rate is dropping into the 50s. This is concerning for her, as it is typically in the 64s. When her heart rate is low she feels lightheaded and dyspneic. She also reports a drop in her GFR that concerned her and PCP. She wants to know if she should hold or discontinue medications that could be impacting her kidney function.     ROS: All other systems reviewed and are otherwise negative except as noted in History of Present Illness.  EKGs/Labs Reviewed    EKG Interpretation Date/Time:  Friday Oct 27 2023 14:37:51 EDT Ventricular Rate:  79 PR Interval:  194 QRS Duration:  70 QT Interval:  356 QTC Calculation: 408 R Axis:   -30  Text Interpretation: Sinus rhythm with occasional Premature ventricular complexes Left axis deviation Low voltage QRS Cannot rule out Anterior infarct , age undetermined When compared with ECG of 05/09/2022 (not in [facility]) Premature ventricular complexes are now Present Confirmed by Morey Ar [phone]) on 10/27/2023 2:50:57 PM    Physical Exam    VS:  BP 108/75 (BP Location: Left Arm)   Pulse 79   Ht 5\' 7"  (1.702 m)   Wt 235 lb (106.6 kg)   SpO2 95%   BMI 36.81 kg/m  , BMI Body mass index is 36.81 kg/m.  GEN: Well nourished,  well developed, in no acute distress. Neck: No JVD or carotid bruits. Cardiac:  RRR. No murmurs. No rubs or gallops.   Respiratory:  Respirations regular and unlabored. Clear to auscultation without rales, wheezing or rhonchi. GI: Soft, nontender, nondistended. Extremities: Radials/DP/PT 2+ and equal bilaterally. No clubbing or cyanosis. No edema.  Skin: Warm and dry, no rash. Neuro: Strength intact.  Assessment & Plan   Bradycardia/dyspnea  Patient reports noting a decrease in her heart rate with  associated dyspnea ever since developing vertigo in October 2024. She reports heart rate will drop into the 50s and she will become lightheaded and dyspneic. She is very concerned about this as her heart rate has always been in the 80s. She reports there are days that she does not eat or drink well. She is unsure if this is when her HR drops.  -7 day Zio for further evaluation.   Hypertension BP today 108/75. Patient reports BP has been running significantly lower than normal ever since she had vertigo. Her PCP is concerned that her GFR dropped with recent blood work. Was previously 50 and is now 63. She wonders if she should stop medications that could be impacting her kidneys. Discussed that she was still in the moderately decreased range with both values. Will get a repeat BMP today. If GFR is low will consider holding lisinopril  and hydrochlorothiazide .  - BMP today.  - Continue lisinopril , HCTZ.  Hyperlipidemia LDL 99 April 2025. - Continue simvastatin . - Continue to follow with PCP.  History of TIA/vertigo Patient reports a remote history of TIA. She recently went through several months of vertigo for which she worked extensively with PCP, ENT, and vestibular therapy. Neurologist suggested she undergo brain MRI to evaluate if she had a stroke. She is concerned MRI will cause recurrent vertigo and does not want to do it. She asks my opinion about this. I recommend she speak to neurology or PCP to determine if a positive MRI would change the treatment she is currently on. If it will not change anything I think it is reasonable to hold off.  - Continue aspirin  and Plavix . - Continue to follow with PCP.  Disposition: BMP today. 7 day zio. Return in 4-5 weeks or sooner as needed.          Signed, [name]. Laylynn Campanella, DNP, NP-C

## 2023-10-28 LAB — BASIC METABOLIC PANEL WITH GFR
BUN/Creatinine Ratio: 18 (ref 12–28)
BUN: 20 mg/dL (ref 8–27)
CO2: 22 mmol/L (ref 20–29)
Calcium: 9.7 mg/dL (ref 8.7–10.3)
Chloride: 102 mmol/L (ref 96–106)
Creatinine, Ser: 1.12 mg/dL — ABNORMAL HIGH (ref 0.57–1.00)
Glucose: 92 mg/dL (ref 70–99)
Potassium: 4.3 mmol/L (ref 3.5–5.2)
Sodium: 142 mmol/L (ref 134–144)
eGFR: 48 mL/min/{1.73_m2} — ABNORMAL LOW (ref >59)

## 2023-10-31 ENCOUNTER — Ambulatory Visit: Payer: Self-pay | Admitting: Student

## 2023-10-31 DIAGNOSIS — Z79899 Other long term (current) drug therapy: Secondary | ICD-10-CM

## 2023-11-01 NOTE — Telephone Encounter (Signed)
 Called and left a message for call back

## 2023-11-01 NOTE — Telephone Encounter (Signed)
 Pt spouse returning call.

## 2023-11-01 NOTE — Telephone Encounter (Signed)
Pt returning nurse call. Please advise.

## 2023-11-19 DIAGNOSIS — R001 Bradycardia, unspecified: Secondary | ICD-10-CM | POA: Diagnosis not present

## 2023-11-20 ENCOUNTER — Other Ambulatory Visit: Payer: Self-pay

## 2023-11-20 DIAGNOSIS — I479 Paroxysmal tachycardia, unspecified: Secondary | ICD-10-CM

## 2023-11-21 ENCOUNTER — Ambulatory Visit: Payer: Medicare Other | Admitting: Dermatology

## 2023-11-21 DIAGNOSIS — Z86018 Personal history of other benign neoplasm: Secondary | ICD-10-CM

## 2023-11-21 DIAGNOSIS — L729 Follicular cyst of the skin and subcutaneous tissue, unspecified: Secondary | ICD-10-CM

## 2023-11-21 DIAGNOSIS — L578 Other skin changes due to chronic exposure to nonionizing radiation: Secondary | ICD-10-CM | POA: Diagnosis not present

## 2023-11-21 DIAGNOSIS — W908XXA Exposure to other nonionizing radiation, initial encounter: Secondary | ICD-10-CM

## 2023-11-21 DIAGNOSIS — L720 Epidermal cyst: Secondary | ICD-10-CM

## 2023-11-21 DIAGNOSIS — L814 Other melanin hyperpigmentation: Secondary | ICD-10-CM | POA: Diagnosis not present

## 2023-11-21 DIAGNOSIS — Z1283 Encounter for screening for malignant neoplasm of skin: Secondary | ICD-10-CM | POA: Diagnosis not present

## 2023-11-21 DIAGNOSIS — I8393 Asymptomatic varicose veins of bilateral lower extremities: Secondary | ICD-10-CM

## 2023-11-21 DIAGNOSIS — L738 Other specified follicular disorders: Secondary | ICD-10-CM

## 2023-11-21 DIAGNOSIS — D2339 Other benign neoplasm of skin of other parts of face: Secondary | ICD-10-CM

## 2023-11-21 DIAGNOSIS — L821 Other seborrheic keratosis: Secondary | ICD-10-CM

## 2023-11-21 DIAGNOSIS — D1801 Hemangioma of skin and subcutaneous tissue: Secondary | ICD-10-CM

## 2023-11-21 DIAGNOSIS — D2239 Melanocytic nevi of other parts of face: Secondary | ICD-10-CM

## 2023-11-21 DIAGNOSIS — D239 Other benign neoplasm of skin, unspecified: Secondary | ICD-10-CM

## 2023-11-21 DIAGNOSIS — D229 Melanocytic nevi, unspecified: Secondary | ICD-10-CM

## 2023-11-21 DIAGNOSIS — D692 Other nonthrombocytopenic purpura: Secondary | ICD-10-CM

## 2023-11-21 NOTE — Progress Notes (Signed)
 Follow-Up Visit   Subjective  Samantha Woodard is a 83 y.o. female who presents for the following: Skin Cancer Screening and Full Body Skin Exam. Patient with hx of dysplastic nevus, atypical mole. Patient does reports some places along bra line.   The patient presents for Total-Body Skin Exam (TBSE) for skin cancer screening and mole check. The patient has spots, moles and lesions to be evaluated, some may be new or changing and the patient may have concern these could be cancer.   The following portions of the chart were reviewed this encounter and updated as appropriate: medications, allergies, medical history  Review of Systems:  No other skin or systemic complaints except as noted in HPI or Assessment and Plan.  Objective  Well appearing patient in no apparent distress; mood and affect are within normal limits.  A full examination was performed including scalp, head, eyes, ears, nose, lips, neck, chest, axillae, abdomen, back, buttocks, bilateral upper extremities, bilateral lower extremities, hands, feet, fingers, toes, fingernails, and toenails. All findings within normal limits unless otherwise noted below.   Relevant physical exam findings are noted in the Assessment and Plan.    Assessment & Plan   SKIN CANCER SCREENING PERFORMED TODAY.  ACTINIC DAMAGE - Chronic condition, secondary to cumulative UV/sun exposure - diffuse scaly erythematous macules with underlying dyspigmentation - Recommend daily broad spectrum sunscreen SPF 30+ to sun-exposed areas, reapply every 2 hours as needed.  - Staying in the shade or wearing long sleeves, sun glasses (UVA+UVB protection) and wide brim hats (4-inch brim around the entire circumference of the hat) are also recommended for sun protection.  - Call for new or changing lesions.  LENTIGINES, SEBORRHEIC KERATOSES, HEMANGIOMAS - Benign normal skin lesions - Benign-appearing - Call for any changes  SEBORRHEIC KERATOSIS - 2.5cm waxy  tan plaque at R anterior ankle  - Benign-appearing - Discussed benign etiology and prognosis. - Observe - Call for any changes  MELANOCYTIC NEVI - Tan-brown and/or pink-flesh-colored symmetric macules and papules - 0.5 x 0.4 cm light tan papule at left malar cheek, stable.  - Benign appearing on exam today - Observation - Call clinic for new or changing moles - Recommend daily use of broad spectrum spf 30+ sunscreen to sun-exposed areas.   Blue Nevus Exam: 2 mm blue gray macule at R paranasal  Benign-appearing.  Stable. Observation.  Call clinic for new or changing lesions.  Recommend daily use of broad spectrum spf 30+ sunscreen to sun-exposed areas.    Atypical intraepidermal melanocytic proliferation, margins free. Excised 11/22/2021  Right medial posterior ankle Round white/red stippled plaque c/w well healed scar    Treatment Plan: Clear. Observe for recurrence. Call clinic for new or changing lesions.  Recommend regular skin exams, daily broad-spectrum spf 30+ sunscreen use, and photoprotection.     HISTORY OF DYSPLASTIC NEVUS Spinal mid upper back - no residual dysplastic nevus 12/2021  No evidence of recurrence today Recommend regular full body skin exams Recommend daily broad spectrum sunscreen SPF 30+ to sun-exposed areas, reapply every 2 hours as needed.  Call if any new or changing lesions are noted between office visits   EPIDERMAL INCLUSION CYST Exam: firm, blue white papule at chest  Benign-appearing. Exam most consistent with an epidermal inclusion cyst. Discussed that a cyst is a benign growth that can grow over time and sometimes get irritated or inflamed. Recommend observation if it is not bothersome. Discussed option of surgical excision to remove it if it is growing, symptomatic, or  other changes noted. Please call for new or changing lesions so they can be evaluated.  Purpura - Chronic; persistent and recurrent.  Treatable, but not curable. - Violaceous  macules and patches - Benign - Related to trauma, age, sun damage and/or use of blood thinners, chronic use of topical and/or oral steroids - Observe - Can use OTC arnica containing moisturizer such as Dermend Bruise Formula if desired - Call for worsening or other concerns  Varicose Veins/Spider Veins - Dilated blue, purple or red veins at the lower extremities - Reassured - Smaller vessels can be treated by sclerotherapy (a procedure to inject a medicine into the veins to make them disappear) if desired, but the treatment is not covered by insurance. Larger vessels may be covered if symptomatic and we would refer to vascular surgeon if treatment desired.  Milia - tiny firm white papules at occipital scalp hairline - type of cyst - benign - sometimes these will clear with nightly OTC adapalene/Differin 0.1% gel or retinol. - may be extracted if symptomatic - observe   DILATED PORE OF WINER Mid Back Benign-appearing.  Observation.  Call clinic for new or changing lesions.  Recommend daily use of broad spectrum spf 30+ sunscreen to sun-exposed areas.   Return in 1 year (on 11/20/2024) for w/ Dr. Annette Barters, HxDysplastic Nevi, TBSE.  I, Jacquelynn V. Grier Leber, CMA, am acting as scribe for Artemio Larry, MD .   Documentation: I have reviewed the above documentation for accuracy and completeness, and I agree with the above.  Artemio Larry, MD

## 2023-11-21 NOTE — Patient Instructions (Addendum)

## 2023-11-22 ENCOUNTER — Other Ambulatory Visit: Payer: Self-pay

## 2023-11-22 ENCOUNTER — Telehealth: Payer: Self-pay | Admitting: Student

## 2023-11-22 DIAGNOSIS — I479 Paroxysmal tachycardia, unspecified: Secondary | ICD-10-CM

## 2023-11-22 NOTE — Progress Notes (Signed)
 Cardiology Clinic Note   Date: 11/27/2023 ID: Samantha Woodard, DOB August 18, 1940, MRN [id number]  Primary Cardiologist:  Evalene Lunger, MD  Chief Complaint   Samantha Woodard is a 83 y.o. female who presents to the clinic today for follow up after testing.   Patient Profile   Samantha Woodard is followed by Dr. Gollan for the history outlined below.       Past medical history significant for: Chest pain. Nuclear stress test 08/01/2018: Low risk study.  No significant ischemia.  No EKG changes concerning for ischemia at peak stress or in recovery. Dyspnea. Echo 07/19/2022: EF 60 to 65%.  No RWMA.  Moderate concentric LVH.  Grade I DD.  Normal RV size/function.  Mild LAE.  No significant valvular abnormalities. Paroxysmal tachycardia/reported bradycardia 7-day ZIO 11/16/2023 demonstrated heart rate 52 to 138 bpm, average 68 bpm.  1194 runs of SVT fastest 5 beats max rate 138 bpm, longest 16 beats average rate 107 bpm.  Frequent PACs (7.8%) and rare PVCs. Hypertension. Hyperlipidemia. Lipid panel 09/19/2023: LDL 99, HDL 47, TG 162, total 179. Varicose veins. OSA. GERD. CKD stage II. TIA.  In summary, patient was first evaluated by Dr. Gollan on 07/24/2018 for exertional chest pain.  Pain is described as midsternal radiating across chest to arms and neck with associated shortness of breath.  She underwent nuclear stress testing which was low risk.  Patient was last seen in the office by Dr. Gollan on 05/09/2022 for routine follow-up with complaints of dyspnea with overexertion.  She underwent echo in February 2024 which showed normal LV/RV function as detailed above.   Patient was last seen in the office by me on 10/27/2023 for routine follow-up.  She reported a several month history of vertigo with dizziness, vision changes and brain fog.  She was evaluated by neurology but was holding off on MRI secondary to becoming dizzy when laying flat.  At the time of her visit her symptoms had mostly  resolved.  She reported heart rate dropping into the 50s and lower BP than usual for her.  She also reported a drop in her GFR which concerned her and her PCP and she wondered if she should hold any medications.  7-day ZIO was ordered for bradycardia and she had a BMP drawn.  Kidney function continued to be low and she was instructed to hold lisinopril  and return in 2 weeks for repeat BMP.  Heart monitor showed frequent runs of SVT and PACs.     History of Present Illness    Today, patient is doing well. She reports continued palpitations described as a brief pause as well as occasionally feeling like her heart is racing. These episodes are brief and not particularly bothersome to her. She stopped lisinopril  for 2 weeks and BP ranged from 118-142/79-88 while off it (log scanned into chart). She is now back on lisinopril  and low dose hydrochlorothiazide . She was happy to see her GFR come back up and would like one more check of her kidney function now that she has been on the lisinopril  and hydrochlorothiazide  for 2 weeks. She has not had any further episodes of vertigo. She is working with physical therapy for her back. Discussed results of heart monitor and detail and all questions answered.     ROS: All other systems reviewed and are otherwise negative except as noted in History of Present Illness.  EKGs/Labs Reviewed       EKG not ordered today.   11/22/2023: BUN 19; Creatinine, Ser 1.02;  Potassium 4.0; Sodium 144    Physical Exam    VS:  BP 120/70 (BP Location: Left Arm, Patient Position: Sitting, Cuff Size: Large)   Pulse 75   Ht 5' 7.5 (1.715 m)   Wt 237 lb 4 oz (107.6 kg)   SpO2 96%   BMI 36.61 kg/m  , BMI Body mass index is 36.61 kg/m.  GEN: Well nourished, well developed, in no acute distress. Neck: No JVD or carotid bruits. Cardiac: RRR. No murmur. No rubs or gallops.   Respiratory:  Respirations regular and unlabored. Clear to auscultation without rales, wheezing or  rhonchi. GI: Soft, nontender, nondistended. Extremities: Radials/DP/PT 2+ and equal bilaterally. No clubbing or cyanosis. No edema.  Skin: Warm and dry, no rash. Neuro: Strength intact.  Assessment & Plan   Palpitations/PACs/PSVT Echo February 2024 demonstrated normal LV/RV function, moderate LVH, Grade I DD, mild LAE.  7-day ZIO 11/16/2023 demonstrated HR 52 to 138 bpm, average 68 bpm, 1194 runs of SVT, frequent PACs.  Patient reports occasional palpitations described as a brief pause and occasional feeling of racing. These episodes are infrequent and brief and not particularly bothersome to her. She would prefer not adding medications unless it is absolutely necessarily. I think it is reasonable to hold off for now. She will contact the office if palpitations become more frequent.  - Will defer starting BB today. If patient becomes more symptomatic she will contact the office.    Hypertension/CKD BP today 128/90 on intake and 120/70 on my recheck. She stopped lisinopril  for 2 weeks and BP ranged from 118-142/79/88.  BMP showed improved eGFR. She would like to have labs checked today now that she has been back on lisinopril  and hydrochlorothiazide  for 2 weeks.  - Continue lisinopril  and HCTZ. - BMP today.    Hyperlipidemia LDL 99 April 2025. - Continue simvastatin . - Continue to follow with PCP.    Disposition: BMP today. Return in 3 months or sooner as needed.          Signed, Barnie HERO. Jerolene Kupfer, DNP, NP-C

## 2023-11-22 NOTE — Telephone Encounter (Signed)
 Patient dropped off blood pressure readings. Place in RN box.

## 2023-11-23 LAB — BASIC METABOLIC PANEL WITH GFR
BUN/Creatinine Ratio: 19 (ref 12–28)
BUN: 19 mg/dL (ref 8–27)
CO2: 23 mmol/L (ref 20–29)
Calcium: 9.6 mg/dL (ref 8.7–10.3)
Chloride: 104 mmol/L (ref 96–106)
Creatinine, Ser: 1.02 mg/dL — ABNORMAL HIGH (ref 0.57–1.00)
Glucose: 96 mg/dL (ref 70–99)
Potassium: 4 mmol/L (ref 3.5–5.2)
Sodium: 144 mmol/L (ref 134–144)
eGFR: 54 mL/min/{1.73_m2} — ABNORMAL LOW (ref >59)

## 2023-11-23 NOTE — Progress Notes (Signed)
 Last read by Kriste Petite at [facility] on 11/21/2023.

## 2023-11-27 ENCOUNTER — Ambulatory Visit: Attending: Student | Admitting: Student

## 2023-11-27 ENCOUNTER — Ambulatory Visit: Payer: Self-pay | Admitting: Student

## 2023-11-27 ENCOUNTER — Encounter: Payer: Self-pay | Admitting: Student

## 2023-11-27 VITALS — BP 120/70 | HR 75 | Ht 67.5 in | Wt 237.2 lb

## 2023-11-27 DIAGNOSIS — I10 Essential (primary) hypertension: Secondary | ICD-10-CM

## 2023-11-27 DIAGNOSIS — R002 Palpitations: Secondary | ICD-10-CM | POA: Diagnosis not present

## 2023-11-27 DIAGNOSIS — Z79899 Other long term (current) drug therapy: Secondary | ICD-10-CM | POA: Diagnosis not present

## 2023-11-27 DIAGNOSIS — I491 Atrial premature depolarization: Secondary | ICD-10-CM

## 2023-11-27 DIAGNOSIS — I471 Supraventricular tachycardia, unspecified: Secondary | ICD-10-CM | POA: Diagnosis not present

## 2023-11-27 DIAGNOSIS — E782 Mixed hyperlipidemia: Secondary | ICD-10-CM

## 2023-11-27 DIAGNOSIS — N1831 Chronic kidney disease, stage 3a: Secondary | ICD-10-CM

## 2023-11-27 NOTE — Patient Instructions (Signed)
 Medication Instructions:  Your physician recommends that you continue on your current medications as directed. Please refer to the Current Medication list given to you today.   *If you need a refill on your cardiac medications before your next appointment, please call your pharmacy*  Lab Work: Your provider would like for you to have following labs drawn today BMP.    If you have labs (blood work) drawn today and your tests are completely normal, you will receive your results only by: MyChart Message (if you have MyChart) OR A paper copy in the mail If you have any lab test that is abnormal or we need to change your treatment, we will call you to review the results.  Testing/Procedures: No test ordered today   Follow-Up: At [facility], you and your health needs are our priority.  As part of our continuing mission to provide you with exceptional heart care, our providers are all part of one team.  This team includes your primary Cardiologist (physician) and Advanced Practice Providers or APPs (Physician Assistants and Nurse Practitioners) who all work together to provide you with the care you need, when you need it.  Your next appointment:   3 month(s)  Provider:   You may see Timothy Gollan, MD or one of the following Advanced Practice Providers on your designated Care Team:   Lonni Meager, NP Lesley Maffucci, PA-C Bernardino Bring, PA-C Cadence Lima, PA-C Tylene Lunch, NP Barnie Hila, NP    We recommend signing up for the patient portal called MyChart.  Sign up information is provided on this After Visit Summary.  MyChart is used to connect with patients for Virtual Visits (Telemedicine).  Patients are able to view lab/test results, encounter notes, upcoming appointments, etc.  Non-urgent messages can be sent to your provider as well.   To learn more about what you can do with MyChart, go to [email or web address].

## 2023-11-28 LAB — BASIC METABOLIC PANEL WITH GFR
BUN/Creatinine Ratio: 18 (ref 12–28)
BUN: 21 mg/dL (ref 8–27)
CO2: 22 mmol/L (ref 20–29)
Calcium: 9.9 mg/dL (ref 8.7–10.3)
Chloride: 102 mmol/L (ref 96–106)
Creatinine, Ser: 1.15 mg/dL — ABNORMAL HIGH (ref 0.57–1.00)
Glucose: 83 mg/dL (ref 70–99)
Potassium: 4.1 mmol/L (ref 3.5–5.2)
Sodium: 141 mmol/L (ref 134–144)
eGFR: 47 mL/min/{1.73_m2} — ABNORMAL LOW (ref >59)

## 2023-11-30 ENCOUNTER — Ambulatory Visit: Admitting: Podiatry

## 2023-11-30 ENCOUNTER — Ambulatory Visit: Payer: Self-pay | Admitting: Student

## 2023-11-30 ENCOUNTER — Encounter: Payer: Self-pay | Admitting: Podiatry

## 2023-11-30 DIAGNOSIS — G63 Polyneuropathy in diseases classified elsewhere: Secondary | ICD-10-CM

## 2023-11-30 DIAGNOSIS — L608 Other nail disorders: Secondary | ICD-10-CM | POA: Diagnosis not present

## 2023-11-30 DIAGNOSIS — D689 Coagulation defect, unspecified: Secondary | ICD-10-CM | POA: Diagnosis not present

## 2023-11-30 DIAGNOSIS — B351 Tinea unguium: Secondary | ICD-10-CM

## 2023-11-30 DIAGNOSIS — M79676 Pain in unspecified toe(s): Secondary | ICD-10-CM | POA: Diagnosis not present

## 2023-11-30 MED ORDER — MUPIROCIN 2 % EX OINT: TOPICAL_OINTMENT | CUTANEOUS | 1 refills | Status: AC

## 2023-11-30 NOTE — Progress Notes (Signed)
 Subjective:  Patient ID: Samantha Woodard, female    DOB: 1940/11/17,  MRN: [id number]  Elonna Mcfarlane presents to clinic today for at risk foot care with h/o coagulation defect  and peripheral neuropathy for painful thick toenails that are difficult to trim. Pain interferes with ambulation. Aggravating factors include wearing enclosed shoe gear. Pain is relieved with periodic professional debridement. Please  clean both big toes of all that tissue. Chief Complaint  Patient presents with   Nail Problem    Thick painful toenails, 3 month follow up   New problem(s): None.   PCP is Sherial Bail, MD.  Allergies  Allergen Reactions   Neomycin Hives, Itching, Rash and Dermatitis   Silicone Hives, Itching, Other (See Comments) and Rash   Tape Hives, Rash and Itching   Iodine Hives    IV CONTRAST   Iodinated Contrast Media Hives   Tizanidine Other (See Comments)    To Strong    Review of Systems: Negative except as noted in the HPI.  Objective: No changes noted in today's physical examination. There were no vitals filed for this visit. Nyelle Wolfson is a pleasant 83 y.o. female in NAD. AAO x 3.  Vascular Examination: Vascular status intact b/l with palpable pedal pulses. CFT immediate b/l. No edema. No pain with calf compression b/l. Skin temperature gradient WNL b/l.   Neurological Examination: Pt has subjective symptoms of neuropathy. Sensation grossly intact b/l with 10 gram monofilament. Vibratory sensation intact b/l.   Dermatological Examination: Pedal skin with normal turgor, texture and tone b/l. Toenails 2-5 b/l thick, discolored, elongated with subungual debris and pain on dorsal palpation.   Anonychia noted L hallux and R hallux. Nailbed(s) epithelialized, but has hyperkeratotic scar noted mostly at medial border left great toe and mild hyperkeratosis of nail bed of right great toe.   Musculoskeletal Examination: Muscle strength 5/5 to b/l LE. No gross bony  deformities bilaterally.  Radiographs: None  Assessment/Plan: 1. 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 Deformity of nail bed   3. 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 Coagulation disorder (HCC)     Meds ordered this encounter  Medications   mupirocin  ointment (BACTROBAN ) 2 %    Sig: Apply to affected toe once daily.    Dispense:  30 g    Refill:  1   -Patient was evaluated today. All questions/concerns addressed on today's visit. -Consent given for treatment as described below: -Patient to continue soft, supportive shoe gear daily. -Mycotic toenails 2-5 bilaterally were debrided in length and girth with sterile nail nippers and dremel without iatrogenic bleeding. -3-WEA applied to nail beds of bilateral great toes. Hyperkeratotic tissue gently removed with sterile scalpel blade, and sterile dermal curettes. All digits cleansed with alcohol and TAO applied. Removed TAO with alcohol and betadine applied to left great toe. Phoned patient at 5:32 pm to wash betadine from left great toe and sent Rx for Mupirocin  Ointment  to [facility] on [address] to be applied to digit once daily for one week. She related understanding. -Patient/POA to call should there be question/concern in the interim.   Return in about 9 weeks (around 02/01/2024).  Delon LITTIE Merlin, DPM      [place] LOCATION: [address]  [place], [place] [address]                   Office [phone]   [facility] LOCATION: [address] [place], [place] [address] Office [phone]

## 2023-12-01 NOTE — Progress Notes (Signed)
 Last read by Naomie Stager at 4:36AM on 12/01/2023.

## 2023-12-19 ENCOUNTER — Ambulatory Visit: Admitting: Cardiovascular Disease

## 2024-01-02 ENCOUNTER — Other Ambulatory Visit: Payer: Self-pay | Admitting: Family Medicine

## 2024-01-02 DIAGNOSIS — M5416 Radiculopathy, lumbar region: Secondary | ICD-10-CM

## 2024-01-03 ENCOUNTER — Inpatient Hospital Stay
Admission: RE | Admit: 2024-01-03 | Discharge: 2024-01-03 | Discharge status: Home / Self Care | Source: Ambulatory Visit | Attending: Family Medicine | Admitting: Family Medicine

## 2024-01-03 DIAGNOSIS — M5416 Radiculopathy, lumbar region: Secondary | ICD-10-CM

## 2024-01-16 ENCOUNTER — Other Ambulatory Visit: Payer: Self-pay | Admitting: Internal Medicine

## 2024-01-16 DIAGNOSIS — R1084 Generalized abdominal pain: Secondary | ICD-10-CM

## 2024-01-24 ENCOUNTER — Ambulatory Visit
Admission: RE | Admit: 2024-01-24 | Discharge: 2024-01-24 | Disposition: A | Discharge status: Home / Self Care | Source: Ambulatory Visit | Attending: Internal Medicine | Admitting: Internal Medicine

## 2024-01-24 DIAGNOSIS — R1084 Generalized abdominal pain: Secondary | ICD-10-CM | POA: Diagnosis present

## 2024-01-24 MED ORDER — IOHEXOL 300 MG/ML  SOLN
100.0000 mL | Freq: Once | INTRAMUSCULAR | Status: AC | PRN
  Administered 2024-01-24: 100 mL via INTRAVENOUS

## 2024-02-01 ENCOUNTER — Ambulatory Visit: Admitting: Podiatry

## 2024-02-01 ENCOUNTER — Encounter: Payer: Self-pay | Admitting: Podiatry

## 2024-02-01 DIAGNOSIS — M79676 Pain in unspecified toe(s): Secondary | ICD-10-CM

## 2024-02-01 DIAGNOSIS — G63 Polyneuropathy in diseases classified elsewhere: Secondary | ICD-10-CM | POA: Diagnosis not present

## 2024-02-01 DIAGNOSIS — D689 Coagulation defect, unspecified: Secondary | ICD-10-CM

## 2024-02-01 DIAGNOSIS — B351 Tinea unguium: Secondary | ICD-10-CM | POA: Diagnosis not present

## 2024-02-03 ENCOUNTER — Encounter: Payer: Self-pay | Admitting: Podiatry

## 2024-02-03 NOTE — Progress Notes (Signed)
  Subjective:  Patient ID: Samantha Woodard, female    DOB: [birth year],  MRN: [id number]  Samantha Woodard presents to clinic today for at risk foot care with h/o neuropathy and coagulation defect and is seen for painful mycotic toenails of both feet which interfere with daily activities. Pain is relieved with periodic professional debridement. States her great toes feel better. Chief Complaint  Patient presents with   [facility]    Rm2 Diabetic foot care/ Dr. Sherial last visit August 25,2025/ A1c 6   New problem(s): None.   PCP is Sherial Bail, MD.  Allergies  Allergen Reactions   Neomycin Hives, Itching, Rash and Dermatitis   Silicone Hives, Itching, Other (See Comments) and Rash   Tape Hives, Rash and Itching   Iodine Hives    IV CONTRAST   Iodinated Contrast Media Hives   Tizanidine Other (See Comments)    To Strong    Review of Systems: Negative except as noted in the HPI.  Objective: No changes noted in today's physical examination. There were no vitals filed for this visit. Samantha Woodard is a pleasant 83 y.o. female  AAO x 3.  Vascular Examination: Vascular status intact b/l with palpable pedal pulses. CFT immediate b/l. No edema. No pain with calf compression b/l. Skin temperature gradient WNL b/l.   Neurological Examination: Pt has subjective symptoms of neuropathy. Sensation grossly intact b/l with 10 gram monofilament. Vibratory sensation intact b/l.   Dermatological Examination: Pedal skin with normal turgor, texture and tone b/l. Toenails 2-5 b/l thick, discolored, elongated with subungual debris and pain on dorsal palpation.   Anonychia noted L hallux and R hallux. Nailbed(s) epithelialized, but has hyperkeratotic scar noted mostly at medial border left great toe and mild hyperkeratosis of nail bed of right great toe.   Musculoskeletal Examination: Muscle strength 5/5 to b/l LE. No gross bony deformities bilaterally.  Radiographs: None  Assessment/Plan: 1.  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 Coagulation disorder (HCC)   3. Polyneuropathy in other diseases classified elsewhere [facility])   Consent given for treatment. Patient examined. All patient's and/or POA's questions/concerns addressed on today's visit. Toenails 2-5 b/l debrided in length and girth without incident. Continue foot and shoe inspections daily. Monitor blood glucose per PCP/Endocrinologist's recommendations. Continue soft, supportive shoe gear daily. Report any pedal injuries to medical professional. Call office if there are any questions/concerns. -Patient/POA to call should there be question/concern in the interim.   Return in about 9 weeks (around 04/04/2024).  Delon LITTIE Merlin, DPM      [place] LOCATION: [address] [place] [address]                   Office [phone]   [facility] LOCATION: [address] [place], [place] [address] Office [phone]

## 2024-02-27 ENCOUNTER — Ambulatory Visit: Admitting: Cardiovascular Disease

## 2024-04-08 ENCOUNTER — Ambulatory Visit: Admitting: Podiatry

## 2024-04-08 ENCOUNTER — Encounter: Payer: Self-pay | Admitting: Podiatry

## 2024-04-08 DIAGNOSIS — B351 Tinea unguium: Secondary | ICD-10-CM | POA: Diagnosis not present

## 2024-04-08 DIAGNOSIS — M79676 Pain in unspecified toe(s): Secondary | ICD-10-CM | POA: Diagnosis not present

## 2024-04-08 DIAGNOSIS — G63 Polyneuropathy in diseases classified elsewhere: Secondary | ICD-10-CM

## 2024-04-08 DIAGNOSIS — D689 Coagulation defect, unspecified: Secondary | ICD-10-CM | POA: Diagnosis not present

## 2024-04-14 NOTE — Progress Notes (Signed)
  Subjective:  Patient ID: Samantha Woodard, female    DOB: 1940-09-04,  MRN: [id number]  Samantha Woodard presents to clinic today for at risk foot care. Patient has history of coagulation defect and polyneuropathy and is seen for painful thick toenails that are difficult to trim. Pain interferes with ambulation. Aggravating factors include wearing enclosed shoe gear. Pain is relieved with periodic professional debridement.  Chief Complaint  Patient presents with   Toe Pain    Denies being diabetic. Dr. Sherial is her PCP and she was there 4 weeks ago   New problem(s): None.   PCP is Sherial Bail, MD.  Allergies  Allergen Reactions   Neomycin Hives, Itching, Rash and Dermatitis   Silicone Hives, Itching, Other (See Comments) and Rash   Tape Hives, Rash and Itching   Iodine Hives    IV CONTRAST   Iodinated Contrast Media Hives   Tizanidine Other (See Comments)    To Strong    Review of Systems: Negative except as noted in the HPI.  Objective: No changes noted in today's physical examination. There were no vitals filed for this visit. Samantha Woodard is a pleasant 83 y.o. female in NAD. AAO x 3.  Vascular Examination: Vascular status intact b/l with palpable pedal pulses. CFT immediate b/l. No edema. No pain with calf compression b/l. Skin temperature gradient WNL b/l.   Neurological Examination: Pt has subjective symptoms of neuropathy. Sensation grossly intact b/l with 10 gram monofilament. Vibratory sensation intact b/l.   Dermatological Examination: Pedal skin with normal turgor, texture and tone b/l. Toenails 2-5 b/l thick, discolored, elongated with subungual debris and pain on dorsal palpation.   Anonychia noted L hallux and R hallux. Nailbed(s) epithelialized, but has hyperkeratotic scar noted mostly at medial border left great toe and mild hyperkeratosis of nail bed of right great toe.   Musculoskeletal Examination: Muscle strength 5/5 to b/l LE. No gross bony  deformities bilaterally.  Radiographs: None  Assessment/Plan: 1. 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 Coagulation disorder   3. Polyneuropathy in other diseases classified elsewhere   -Patient was evaluated today. All questions/concerns addressed on today's visit. -Examined patient. -Patient to continue soft, supportive shoe gear daily. -Toenails 2-5 bilaterally were debrided in length and girth with sterile nail nippers without iatrogenic bleeding.  -Patient/POA to call should there be question/concern in the interim.   Return in about 9 weeks (around 06/10/2024).  Delon LITTIE Merlin, DPM      [place] LOCATION: [address], [place] [address]                   Office [phone]   [facility] LOCATION: [address] [place], [place] [address] Office [phone]

## 2024-04-22 NOTE — Progress Notes (Unsigned)
 Who presents for follow-up of her cardiology Office Note  Date:  04/22/2024   ID:  Samantha Woodard, DOB: 01/31/1941, MRN: [id number]  PCP:  Samantha Bail, MD   No chief complaint on file.   HPI:  Samantha Woodard is a 83 y.o. female with a PMHx of: Paroxysmal tachycardia Anemia CKD HTN Dyslipidemia Varicose veins of lower extremities  TIA, on aspirin  Plavix  GERD Spina bifida occulta, Scoliosis  Mild mitral valve prolapse per old echo OSA Left knee replacement Obesity Degenerative Arthiritis Family history of heart disease.  Non smoker Cardiac cath in the 1980s Who presents for follow-up of her chest pain, tachycardia, shortness of breath  LOV 12/23 Has been seen twice by one of our providers most recently July 2025   knee surgery, right knee Mobility limited by chronic back pain, scoliosis  Reports having some shortness of breath No chest pain or angina symptoms, Chronic stable mild lower extremity edema No PND orthopnea No ecardiogram on record  Labs reviewed Total chol 162 LDL 83 CR 1.1 TSH 2.88, hemoglobin 12.6  EKG personally reviewed by myself on todays visit Normal sinus rhythm rate 79 bpm no significant ST-T wave changes  Prior history reviewed Atypical chest discomfort on last visit 2 years ago, underwent stress test 07/2018 Pharmacological myocardial perfusion imaging study with no significant  ischemia Normal wall motion, EF estimated at 73% No EKG changes concerning for ischemia at peak stress or in recovery. Low risk scan  PMH:   has a past medical history of Anemia, Anginal pain, Anxiety and depression, Arthritis, degenerative, Atrial fibrillation (HCC), Atypical mole (10/26/2021), Cerebrovascular disease, Chronic kidney disease, Chronic low back pain, Complication of anesthesia (1970's), Diverticulosis, Dyslipidemia, Dysplastic nevus (10/26/2021), Fibromyalgia, GERD (gastroesophageal reflux disease), History of hiatal hernia, HTN (hypertension),  IBS (irritable bowel syndrome), Mild mitral valve prolapse, Obesity, OSA (obstructive sleep apnea), Peripheral neuropathy, Peripheral vascular disease, Positional vertigo, Rosacea, Rosacea, Scoliosis, Spina bifida occulta, Stroke (HCC), and Varicose vein.  PSH:    Past Surgical History:  Procedure Laterality Date   CARDIAC CATHETERIZATION  1990's   no blockage    CHOLECYSTECTOMY     COLONOSCOPY     ENDOVENOUS ABLATION SAPHENOUS VEIN W/ LASER  10/22/2007   ESOPHAGOGASTRODUODENOSCOPY     gallbladder resection     JOINT REPLACEMENT Right 2022   right knee   MINOR HEMORRHOIDECTOMY     POLYPECTOMY     TONSILLECTOMY     TOTAL KNEE ARTHROPLASTY Left 11/28/2016   Procedure: TOTAL KNEE ARTHROPLASTY;  Surgeon: Samantha Lerner, MD;  Location: [facility] OR;  Service: Orthopedics;  Laterality: Left;   VAGINAL HYSTERECTOMY      Current Outpatient Medications  Medication Sig Dispense Refill   acetaminophen  (TYLENOL ) 500 MG tablet Take 500 mg by mouth every 6 (six) hours as needed.      aspirin  EC 81 MG tablet Take 81 mg by mouth daily with supper.     carisoprodol (SOMA) 350 MG tablet Take 175-350 mg by mouth daily as needed for muscle spasms.     celecoxib  (CELEBREX ) 200 MG capsule Take 200 mg by mouth daily with lunch.      cetirizine (ZYRTEC) 10 MG tablet Take 10 mg by mouth at bedtime.     Cholecalciferol (VITAMIN D-3 PO) Take by mouth daily.     clopidogrel  (PLAVIX ) 75 MG tablet Take 0.5 tablets (37.5 mg total) by mouth daily. 90 tablet 0   cyanocobalamin (VITAMIN B12) 1000 MCG tablet Take 1,000 mcg by mouth daily.  famotidine (PEPCID) 40 MG tablet Take 40 mg by mouth at bedtime.     fluticasone  (FLONASE ) 50 MCG/ACT nasal spray Place 1 spray into the nose at bedtime.     furosemide (LASIX) 20 MG tablet Take 20 mg by mouth as needed for fluid or edema.     gabapentin  (NEURONTIN ) 600 MG tablet Take 1 tablet (600 mg total) by mouth 3 (three) times daily. 270 tablet 0   hydrochlorothiazide   (HYDRODIURIL ) 12.5 MG tablet Take 12.5 mg by mouth daily with lunch.      lisinopril  (ZESTRIL ) 5 MG tablet Take 2.5 mg by mouth daily.     meclizine (ANTIVERT) 25 MG tablet Take by mouth as needed for dizziness.     Multiple Vitamin (MULTIVITAMIN WITH MINERALS) TABS tablet Take 1 tablet by mouth daily with supper. WOMEN'S 50+     mupirocin  ointment (BACTROBAN ) 2 % Apply to affected toe once daily. 30 g 1   pantoprazole  (PROTONIX ) 40 MG tablet Take 40 mg by mouth 2 (two) times daily before a meal. 30 MINS TO 1 HOUR BEFORE EATING.     Probiotic Product (ALIGN) 4 MG CAPS Take 4 mg by mouth daily with lunch.     Simethicone  180 MG CAPS Take 180 mg by mouth 2 (two) times daily as needed (FOR BLOATING/GAS). phazyme     simvastatin  (ZOCOR ) 20 MG tablet Take 20 mg by mouth at bedtime.     tetrahydrozoline-zinc (VISINE-AC) 0.05-0.25 % ophthalmic solution Place 2 drops into both eyes 3 (three) times daily as needed (for redness/itchy eyes.).     Wheat Dextrin (BENEFIBER) POWD Take 22.5 mLs by mouth 2 (two) times daily.     No current facility-administered medications for this visit.    ALLERGIES:   Neomycin, Silicone, Tape, Iodine, Iodinated contrast media, and Tizanidine   SOCIAL HISTORY:  The patient  reports that she has never smoked. She has never used smokeless tobacco. She reports that she does not drink alcohol and does not use drugs.   FAMILY HISTORY:   family history includes Cancer in her father and mother; Carpal tunnel syndrome in her sister; Cerebrovascular Disease in her maternal grandmother; Diabetes in her sister; Heart attack in her father and mother; Heart disease in her father; Hyperlipidemia in her brother, sister, and sister; Hypertension in her brother, father, mother, sister, and sister; Melanoma in her father and sister; Stroke in her paternal grandmother; Varicose Veins in her mother, sister, and sister.    REVIEW OF SYSTEMS: Review of Systems  Constitutional: Negative.    HENT: Negative.    Eyes: Negative.   Respiratory: Negative.    Cardiovascular: Negative.   Gastrointestinal: Negative.   Genitourinary: Negative.   Musculoskeletal: Negative.   Neurological: Negative.   Psychiatric/Behavioral: Negative.    All other systems reviewed and are negative.   PHYSICAL EXAM: VS:  There were no vitals taken for this visit. , BMI There is no height or weight on file to calculate BMI. Constitutional:  oriented to person, place, and time. No distress.  HENT:  Head: Grossly normal Eyes:  no discharge. No scleral icterus.  Neck: No JVD, no carotid bruits  Cardiovascular: Regular rate and rhythm, no murmurs appreciated Pulmonary/Chest: Clear to auscultation bilaterally, no wheezes or rails Abdominal: Soft.  no distension.  no tenderness.  Musculoskeletal: Normal range of motion Neurological:  normal muscle tone. Coordination normal. No atrophy Skin: Skin warm and dry Psychiatric: normal affect, pleasant  RECENT LABS: 11/27/2023: BUN 21; Creatinine, Ser 1.15; Potassium  4.1; Sodium 141    LIPID PANEL: No results found for: CHOL, HDL, LDLCALC, TRIG    WEIGHT: Wt Readings from Last 3 Encounters:  11/27/23 237 lb 4 oz (107.6 kg)  10/27/23 235 lb (106.6 kg)  08/22/23 241 lb 12.8 oz (109.7 kg)     ASSESSMENT AND PLAN:  Chest pain with moderate risk for cardiac etiology  Atypical chest pain several years ago, stress test with no ischemia no further testing needed  Shortness of breath Likely multifactorial including deconditioning, weight Unable to exclude structural heart disease, baseline echocardiogram has been ordered  Morbid obesity Exercise limited secondary to arthritides knee and back pain We have encouraged exercise program, careful diet management in an effort to lose weight.  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     Signed, Velinda Lunger, M.D., Ph.D. 04/22/2024  [facility] [place], [place] [phone]

## 2024-04-23 ENCOUNTER — Ambulatory Visit: Attending: Cardiovascular Disease | Admitting: Cardiovascular Disease

## 2024-04-23 ENCOUNTER — Encounter: Payer: Self-pay | Admitting: Cardiovascular Disease

## 2024-04-23 VITALS — BP 140/60 | HR 72 | Wt 236.0 lb

## 2024-04-23 DIAGNOSIS — R0602 Shortness of breath: Secondary | ICD-10-CM

## 2024-04-23 DIAGNOSIS — G459 Transient cerebral ischemic attack, unspecified: Secondary | ICD-10-CM

## 2024-04-23 DIAGNOSIS — I471 Supraventricular tachycardia, unspecified: Secondary | ICD-10-CM

## 2024-04-23 DIAGNOSIS — E782 Mixed hyperlipidemia: Secondary | ICD-10-CM

## 2024-04-23 DIAGNOSIS — I10 Essential (primary) hypertension: Secondary | ICD-10-CM

## 2024-04-23 DIAGNOSIS — I479 Paroxysmal tachycardia, unspecified: Secondary | ICD-10-CM

## 2024-04-23 DIAGNOSIS — R001 Bradycardia, unspecified: Secondary | ICD-10-CM

## 2024-04-23 DIAGNOSIS — I491 Atrial premature depolarization: Secondary | ICD-10-CM

## 2024-04-23 DIAGNOSIS — R002 Palpitations: Secondary | ICD-10-CM

## 2024-04-23 DIAGNOSIS — N1831 Chronic kidney disease, stage 3a: Secondary | ICD-10-CM

## 2024-04-23 MED ORDER — NITROGLYCERIN 0.4 MG SL SUBL: 0.4000 mg | SUBLINGUAL_TABLET | SUBLINGUAL | 3 refills | Status: AC | PRN

## 2024-04-23 NOTE — Patient Instructions (Signed)

## 2024-06-17 ENCOUNTER — Encounter: Payer: Self-pay | Admitting: Podiatry

## 2024-06-17 ENCOUNTER — Ambulatory Visit: Admitting: Podiatry

## 2024-06-17 DIAGNOSIS — B351 Tinea unguium: Secondary | ICD-10-CM | POA: Diagnosis not present

## 2024-06-17 DIAGNOSIS — D689 Coagulation defect, unspecified: Secondary | ICD-10-CM

## 2024-06-17 DIAGNOSIS — M79676 Pain in unspecified toe(s): Secondary | ICD-10-CM | POA: Diagnosis not present

## 2024-06-17 DIAGNOSIS — G63 Polyneuropathy in diseases classified elsewhere: Secondary | ICD-10-CM

## 2024-06-24 NOTE — Progress Notes (Signed)
"  °  Subjective:  Patient ID: Samantha Woodard, female    DOB: 12-Jul-1940,  MRN: [id number]  Samantha Woodard presents to clinic today for at risk foot care with h/o coagulation defect and painful mycotic toenails of both feet that are difficult to trim. Pain interferes with daily activities and wearing enclosed shoe gear comfortably.  Chief Complaint  Patient presents with   Diabetes    A1c 6.2 She saw DR. Kalisetti in Sept   New problem(s): None.   PCP is Sherial Bail, MD.  Allergies[1]  Review of Systems: Negative except as noted in the HPI.  Objective: No changes noted in today's physical examination. There were no vitals filed for this visit. Samantha Woodard is a pleasant 84 y.o. female in NAD. AAO x 3.  Vascular Examination: Vascular status intact b/l with palpable pedal pulses. CFT immediate b/l. No edema. No pain with calf compression b/l. Skin temperature gradient WNL b/l.   Neurological Examination: Pt has subjective symptoms of neuropathy. Sensation grossly intact b/l with 10 gram monofilament. Vibratory sensation intact b/l.   Dermatological Examination: Pedal skin with normal turgor, texture and tone b/l. Toenails 2-5 b/l thick, discolored, elongated with subungual debris and pain on dorsal palpation.   Anonychia noted L hallux and R hallux. Nailbed(s) epithelialized, but has hyperkeratotic scar noted mostly at medial border left great toe and mild hyperkeratosis of nail bed of right great toe.   Musculoskeletal Examination: Muscle strength 5/5 to b/l LE. No gross bony deformities bilaterally. Using cane for ambulation assistance.  Radiographs: None  Assessment/Plan: 1. 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 Coagulation disorder   3. 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 -Patient to continue soft, supportive shoe gear daily. -Mycotic toenails 2-5 bilaterally were debrided in  length and girth with sterile nail nippers and dremel without iatrogenic bleeding. -Patient/POA to call should there be question/concern in the interim.   Return in about 9 weeks (around 08/19/2024).  Delon LITTIE Merlin, DPM      [place] LOCATION: [address] [place], [place] [address]                   Office [phone]   [place] LOCATION: [address] [place], [place] [address] Office [phone]     [1]  Allergies Allergen Reactions   Neomycin Dermatitis, Hives, Itching, Rash and Other (See Comments)   Silicone Hives, Itching, Other (See Comments) and Rash   Tape Hives, Itching, Rash and Other (See Comments)   Iodine Hives    IV CONTRAST   Iodinated Contrast Media Hives and Other (See Comments)   Tizanidine Other (See Comments)    To Strong  Other Reaction(s): extreme sedation and head buzzing   Wound Dressing Adhesive Dermatitis   "

## 2024-08-26 ENCOUNTER — Ambulatory Visit: Admitting: Podiatry

## 2024-12-03 ENCOUNTER — Encounter: Admitting: Dermatology
